# Patient Record
Sex: Female | Born: 1965 | Race: Black or African American | Hispanic: No | Smoking: Never smoker
Health system: Southern US, Community
[De-identification: ages and names within clinical notes are randomized; demographics above are authoritative.]

## PROBLEM LIST (undated history)

## (undated) DIAGNOSIS — I10 Essential (primary) hypertension: Secondary | ICD-10-CM

## (undated) DIAGNOSIS — M797 Fibromyalgia: Secondary | ICD-10-CM

## (undated) DIAGNOSIS — Z01419 Encounter for gynecological examination (general) (routine) without abnormal findings: Secondary | ICD-10-CM

## (undated) DIAGNOSIS — R51 Headache: Secondary | ICD-10-CM

## (undated) DIAGNOSIS — F419 Anxiety disorder, unspecified: Secondary | ICD-10-CM

## (undated) DIAGNOSIS — D509 Iron deficiency anemia, unspecified: Secondary | ICD-10-CM

## (undated) DIAGNOSIS — T7840XA Allergy, unspecified, initial encounter: Secondary | ICD-10-CM

## (undated) DIAGNOSIS — E669 Obesity, unspecified: Secondary | ICD-10-CM

## (undated) DIAGNOSIS — G43909 Migraine, unspecified, not intractable, without status migrainosus: Secondary | ICD-10-CM

## (undated) DIAGNOSIS — Z9884 Bariatric surgery status: Secondary | ICD-10-CM

## (undated) DIAGNOSIS — E538 Deficiency of other specified B group vitamins: Secondary | ICD-10-CM

## (undated) DIAGNOSIS — R55 Syncope and collapse: Secondary | ICD-10-CM

## (undated) DIAGNOSIS — R7989 Other specified abnormal findings of blood chemistry: Secondary | ICD-10-CM

## (undated) HISTORY — DX: Syncope and collapse: R55

## (undated) HISTORY — DX: Allergy, unspecified, initial encounter: T78.40XA

## (undated) HISTORY — DX: Obesity, unspecified: E66.9

## (undated) HISTORY — PX: ADENOIDECTOMY: SUR15

## (undated) HISTORY — DX: Headache: R51

## (undated) HISTORY — DX: Encounter for gynecological examination (general) (routine) without abnormal findings: Z01.419

## (undated) HISTORY — PX: UPPER GASTROINTESTINAL ENDOSCOPY: SHX188

## (undated) HISTORY — PX: GASTRIC BYPASS: SHX52

## (undated) HISTORY — DX: Deficiency of other specified B group vitamins: E53.8

## (undated) HISTORY — DX: Fibromyalgia: M79.7

## (undated) HISTORY — DX: Other specified abnormal findings of blood chemistry: R79.89

## (undated) HISTORY — DX: Migraine, unspecified, not intractable, without status migrainosus: G43.909

## (undated) HISTORY — DX: Bariatric surgery status: Z98.84

## (undated) HISTORY — DX: Essential (primary) hypertension: I10

## (undated) HISTORY — PX: TUBAL LIGATION: SHX77

## (undated) HISTORY — DX: Anxiety disorder, unspecified: F41.9

## (undated) HISTORY — DX: Iron deficiency anemia, unspecified: D50.9

---

## 2000-07-19 ENCOUNTER — Other Ambulatory Visit: Admission: RE | Admit: 2000-07-19 | Discharge: 2000-07-19 | Payer: Self-pay | Admitting: *Deleted

## 2001-04-24 ENCOUNTER — Emergency Department (HOSPITAL_COMMUNITY): Admission: EM | Admit: 2001-04-24 | Discharge: 2001-04-24 | Payer: Self-pay | Admitting: Emergency Medicine

## 2001-06-27 ENCOUNTER — Encounter: Admission: RE | Admit: 2001-06-27 | Discharge: 2001-08-01 | Payer: Self-pay | Admitting: Family Medicine

## 2002-10-11 ENCOUNTER — Other Ambulatory Visit: Admission: RE | Admit: 2002-10-11 | Discharge: 2002-10-11 | Payer: Self-pay | Admitting: Obstetrics and Gynecology

## 2002-10-16 ENCOUNTER — Encounter: Admission: RE | Admit: 2002-10-16 | Discharge: 2003-01-14 | Payer: Self-pay | Admitting: Internal Medicine

## 2002-10-17 ENCOUNTER — Encounter: Admission: RE | Admit: 2002-10-17 | Discharge: 2002-10-17 | Payer: Self-pay | Admitting: Internal Medicine

## 2002-10-17 ENCOUNTER — Encounter: Payer: Self-pay | Admitting: Internal Medicine

## 2002-10-31 HISTORY — PX: ESOPHAGOGASTRODUODENOSCOPY: SHX1529

## 2002-11-01 ENCOUNTER — Emergency Department (HOSPITAL_COMMUNITY): Admission: EM | Admit: 2002-11-01 | Discharge: 2002-11-02 | Payer: Self-pay | Admitting: Emergency Medicine

## 2005-08-09 ENCOUNTER — Ambulatory Visit: Payer: Self-pay | Admitting: Internal Medicine

## 2006-02-11 ENCOUNTER — Ambulatory Visit: Payer: Self-pay | Admitting: Internal Medicine

## 2006-10-20 ENCOUNTER — Ambulatory Visit: Payer: Self-pay | Admitting: Internal Medicine

## 2007-04-28 ENCOUNTER — Ambulatory Visit: Payer: Self-pay | Admitting: Internal Medicine

## 2007-06-25 ENCOUNTER — Emergency Department (HOSPITAL_COMMUNITY): Admission: EM | Admit: 2007-06-25 | Discharge: 2007-06-25 | Payer: Self-pay | Admitting: Emergency Medicine

## 2007-06-27 ENCOUNTER — Ambulatory Visit: Payer: Self-pay | Admitting: Internal Medicine

## 2007-06-27 LAB — CONVERTED CEMR LAB
ALT: 13 units/L (ref 0–35)
AST: 21 units/L (ref 0–37)
BUN: 6 mg/dL (ref 6–23)
Basophils Absolute: 0 10*3/uL (ref 0.0–0.1)
Basophils Relative: 0.4 % (ref 0.0–1.0)
CO2: 28 meq/L (ref 19–32)
Calcium: 8.3 mg/dL — ABNORMAL LOW (ref 8.4–10.5)
Chloride: 105 meq/L (ref 96–112)
Cholesterol: 144 mg/dL (ref 0–200)
Creatinine, Ser: 0.8 mg/dL (ref 0.4–1.2)
Eosinophils Absolute: 0.1 10*3/uL (ref 0.0–0.6)
Eosinophils Relative: 2.2 % (ref 0.0–5.0)
Ferritin: 6 ng/mL — ABNORMAL LOW (ref 10.0–291.0)
GFR calc Af Amer: 102 mL/min
GFR calc non Af Amer: 84 mL/min
Glucose, Bld: 58 mg/dL — ABNORMAL LOW (ref 70–99)
HCT: 26.6 % — ABNORMAL LOW (ref 36.0–46.0)
HDL: 40.4 mg/dL (ref >39.0)
Hemoglobin: 8.7 g/dL — ABNORMAL LOW (ref 12.0–15.0)
LDL Cholesterol: 96 mg/dL (ref 0–99)
Lymphocytes Relative: 52.5 % — ABNORMAL HIGH (ref 12.0–46.0)
MCHC: 32.7 g/dL (ref 30.0–36.0)
MCV: 74.7 fL — ABNORMAL LOW (ref 78.0–100.0)
Monocytes Absolute: 0.4 10*3/uL (ref 0.2–0.7)
Monocytes Relative: 7.3 % (ref 3.0–11.0)
Neutro Abs: 1.9 10*3/uL (ref 1.4–7.7)
Neutrophils Relative %: 37.6 % — ABNORMAL LOW (ref 43.0–77.0)
Platelets: 339 10*3/uL (ref 150–400)
Potassium: 3.6 meq/L (ref 3.5–5.1)
RBC: 3.57 M/uL — ABNORMAL LOW (ref 3.87–5.11)
RDW: 15.2 % — ABNORMAL HIGH (ref 11.5–14.6)
Sodium: 139 meq/L (ref 135–145)
TSH: 1.38 microintl units/mL (ref 0.35–5.50)
Total CHOL/HDL Ratio: 3.6
Triglycerides: 40 mg/dL (ref 0–149)
VLDL: 8 mg/dL (ref 0–40)
Vit D, 1,25-Dihydroxy: 9 — ABNORMAL LOW (ref 20–57)
WBC: 5 10*3/uL (ref 4.5–10.5)

## 2007-07-14 DIAGNOSIS — I10 Essential (primary) hypertension: Secondary | ICD-10-CM | POA: Insufficient documentation

## 2007-07-14 DIAGNOSIS — D509 Iron deficiency anemia, unspecified: Secondary | ICD-10-CM

## 2007-07-21 ENCOUNTER — Encounter: Payer: Self-pay | Admitting: Internal Medicine

## 2007-08-21 ENCOUNTER — Ambulatory Visit: Payer: Self-pay | Admitting: Internal Medicine

## 2007-08-21 DIAGNOSIS — E559 Vitamin D deficiency, unspecified: Secondary | ICD-10-CM | POA: Insufficient documentation

## 2007-08-21 DIAGNOSIS — R519 Headache, unspecified: Secondary | ICD-10-CM | POA: Insufficient documentation

## 2007-08-21 DIAGNOSIS — Z9884 Bariatric surgery status: Secondary | ICD-10-CM | POA: Insufficient documentation

## 2007-08-21 DIAGNOSIS — R51 Headache: Secondary | ICD-10-CM

## 2007-08-21 HISTORY — DX: Bariatric surgery status: Z98.84

## 2007-08-21 HISTORY — DX: Headache: R51

## 2007-08-21 LAB — CONVERTED CEMR LAB: Hemoglobin: 8.2 g/dL

## 2007-09-26 ENCOUNTER — Encounter: Payer: Self-pay | Admitting: Internal Medicine

## 2007-10-07 ENCOUNTER — Telehealth: Payer: Self-pay | Admitting: Internal Medicine

## 2007-11-22 ENCOUNTER — Encounter: Payer: Self-pay | Admitting: Internal Medicine

## 2008-05-20 ENCOUNTER — Ambulatory Visit: Payer: Self-pay | Admitting: Internal Medicine

## 2008-05-20 DIAGNOSIS — IMO0001 Reserved for inherently not codable concepts without codable children: Secondary | ICD-10-CM

## 2008-05-20 DIAGNOSIS — J069 Acute upper respiratory infection, unspecified: Secondary | ICD-10-CM | POA: Insufficient documentation

## 2008-06-05 ENCOUNTER — Telehealth: Payer: Self-pay | Admitting: Internal Medicine

## 2008-06-26 ENCOUNTER — Ambulatory Visit: Payer: Self-pay | Admitting: Internal Medicine

## 2008-06-26 DIAGNOSIS — J309 Allergic rhinitis, unspecified: Secondary | ICD-10-CM | POA: Insufficient documentation

## 2008-06-26 DIAGNOSIS — H53149 Visual discomfort, unspecified: Secondary | ICD-10-CM

## 2008-06-26 DIAGNOSIS — G43009 Migraine without aura, not intractable, without status migrainosus: Secondary | ICD-10-CM | POA: Insufficient documentation

## 2008-08-14 ENCOUNTER — Ambulatory Visit: Payer: Self-pay | Admitting: Internal Medicine

## 2008-08-14 LAB — CONVERTED CEMR LAB
ALT: 15 units/L (ref 0–35)
AST: 19 units/L (ref 0–37)
Albumin: 3.2 g/dL — ABNORMAL LOW (ref 3.5–5.2)
Alkaline Phosphatase: 158 units/L — ABNORMAL HIGH (ref 39–117)
BUN: 6 mg/dL (ref 6–23)
Basophils Absolute: 0 10*3/uL (ref 0.0–0.1)
Basophils Relative: 0.4 % (ref 0.0–3.0)
Bilirubin Urine: NEGATIVE
Bilirubin, Direct: 0.1 mg/dL (ref 0.0–0.3)
Blood in Urine, dipstick: NEGATIVE
CO2: 29 meq/L (ref 19–32)
Calcium: 8.3 mg/dL — ABNORMAL LOW (ref 8.4–10.5)
Chloride: 110 meq/L (ref 96–112)
Cholesterol: 146 mg/dL (ref 0–200)
Creatinine, Ser: 0.7 mg/dL (ref 0.4–1.2)
Eosinophils Absolute: 0.1 10*3/uL (ref 0.0–0.7)
Eosinophils Relative: 2.2 % (ref 0.0–5.0)
Ferritin: 5.4 ng/mL — ABNORMAL LOW (ref 10.0–291.0)
GFR calc Af Amer: 118 mL/min
GFR calc non Af Amer: 98 mL/min
Glucose, Bld: 91 mg/dL (ref 70–99)
Glucose, Urine, Semiquant: NEGATIVE
HCT: 29.4 % — ABNORMAL LOW (ref 36.0–46.0)
HDL: 34.3 mg/dL — ABNORMAL LOW (ref >39.0)
Hemoglobin: 9.7 g/dL — ABNORMAL LOW (ref 12.0–15.0)
Ketones, urine, test strip: NEGATIVE
LDL Cholesterol: 103 mg/dL — ABNORMAL HIGH (ref 0–99)
Lymphocytes Relative: 46.1 % — ABNORMAL HIGH (ref 12.0–46.0)
MCHC: 32.9 g/dL (ref 30.0–36.0)
MCV: 81.5 fL (ref 78.0–100.0)
Monocytes Absolute: 0.2 10*3/uL (ref 0.1–1.0)
Monocytes Relative: 6.9 % (ref 3.0–12.0)
Neutro Abs: 1.6 10*3/uL (ref 1.4–7.7)
Neutrophils Relative %: 44.4 % (ref 43.0–77.0)
Nitrite: NEGATIVE
Platelets: 246 10*3/uL (ref 150–400)
Potassium: 3.5 meq/L (ref 3.5–5.1)
Protein, U semiquant: NEGATIVE
RBC: 3.6 M/uL — ABNORMAL LOW (ref 3.87–5.11)
RDW: 14.1 % (ref 11.5–14.6)
Sodium: 140 meq/L (ref 135–145)
Specific Gravity, Urine: 1.015
TSH: 0.81 microintl units/mL (ref 0.35–5.50)
Total Bilirubin: 1 mg/dL (ref 0.3–1.2)
Total CHOL/HDL Ratio: 4.3
Total Protein: 6.6 g/dL (ref 6.0–8.3)
Triglycerides: 45 mg/dL (ref 0–149)
Urobilinogen, UA: 1
VLDL: 9 mg/dL (ref 0–40)
Vit D, 1,25-Dihydroxy: 23 — ABNORMAL LOW (ref 30–89)
WBC: 3.6 10*3/uL — ABNORMAL LOW (ref 4.5–10.5)
pH: 7

## 2008-08-21 ENCOUNTER — Ambulatory Visit: Payer: Self-pay | Admitting: Internal Medicine

## 2008-08-21 ENCOUNTER — Other Ambulatory Visit: Admission: RE | Admit: 2008-08-21 | Discharge: 2008-08-21 | Payer: Self-pay | Admitting: Internal Medicine

## 2008-08-21 ENCOUNTER — Encounter: Payer: Self-pay | Admitting: Internal Medicine

## 2008-08-21 DIAGNOSIS — R748 Abnormal levels of other serum enzymes: Secondary | ICD-10-CM | POA: Insufficient documentation

## 2008-08-26 ENCOUNTER — Telehealth: Payer: Self-pay | Admitting: Internal Medicine

## 2008-08-28 ENCOUNTER — Ambulatory Visit (HOSPITAL_COMMUNITY): Payer: Self-pay | Admitting: Psychiatry

## 2008-09-02 ENCOUNTER — Other Ambulatory Visit (HOSPITAL_COMMUNITY): Admission: RE | Admit: 2008-09-02 | Discharge: 2008-09-23 | Payer: Self-pay | Admitting: Psychiatry

## 2008-09-06 ENCOUNTER — Ambulatory Visit: Payer: Self-pay | Admitting: Psychiatry

## 2008-10-22 ENCOUNTER — Ambulatory Visit: Payer: Self-pay | Admitting: Internal Medicine

## 2008-10-22 LAB — CONVERTED CEMR LAB
ALT: 12 units/L (ref 0–35)
AST: 17 units/L (ref 0–37)
Albumin: 3.1 g/dL — ABNORMAL LOW (ref 3.5–5.2)
Alkaline Phosphatase: 156 units/L — ABNORMAL HIGH (ref 39–117)
Basophils Absolute: 0 10*3/uL (ref 0.0–0.1)
Basophils Relative: 0.2 % (ref 0.0–3.0)
Bilirubin, Direct: 0.2 mg/dL (ref 0.0–0.3)
Eosinophils Absolute: 0.1 10*3/uL (ref 0.0–0.7)
Eosinophils Relative: 2.4 % (ref 0.0–5.0)
GGT: 5 units/L — ABNORMAL LOW (ref 7–51)
HCT: 29.2 % — ABNORMAL LOW (ref 36.0–46.0)
Hemoglobin: 9.5 g/dL — ABNORMAL LOW (ref 12.0–15.0)
Lymphocytes Relative: 40.8 % (ref 12.0–46.0)
MCHC: 32.4 g/dL (ref 30.0–36.0)
MCV: 81.7 fL (ref 78.0–100.0)
Monocytes Absolute: 0.3 10*3/uL (ref 0.1–1.0)
Monocytes Relative: 7.3 % (ref 3.0–12.0)
Neutro Abs: 2.4 10*3/uL (ref 1.4–7.7)
Neutrophils Relative %: 49.3 % (ref 43.0–77.0)
Platelets: 253 10*3/uL (ref 150–400)
RBC: 3.57 M/uL — ABNORMAL LOW (ref 3.87–5.11)
RDW: 14.6 % (ref 11.5–14.6)
Total Bilirubin: 1 mg/dL (ref 0.3–1.2)
Total Protein: 6.2 g/dL (ref 6.0–8.3)
Vitamin B-12: 138 pg/mL — ABNORMAL LOW (ref 211–911)
WBC: 4.7 10*3/uL (ref 4.5–10.5)

## 2008-10-30 ENCOUNTER — Ambulatory Visit: Payer: Self-pay | Admitting: Internal Medicine

## 2008-10-30 DIAGNOSIS — D518 Other vitamin B12 deficiency anemias: Secondary | ICD-10-CM

## 2008-10-30 DIAGNOSIS — R350 Frequency of micturition: Secondary | ICD-10-CM | POA: Insufficient documentation

## 2008-10-30 LAB — CONVERTED CEMR LAB
Ketones, urine, test strip: NEGATIVE
Nitrite: NEGATIVE
Protein, U semiquant: NEGATIVE
Urobilinogen, UA: 0.2
pH: 5

## 2008-10-31 ENCOUNTER — Encounter: Payer: Self-pay | Admitting: Internal Medicine

## 2008-11-04 ENCOUNTER — Encounter: Payer: Self-pay | Admitting: Internal Medicine

## 2008-12-25 ENCOUNTER — Ambulatory Visit: Payer: Self-pay | Admitting: Internal Medicine

## 2008-12-25 LAB — CONVERTED CEMR LAB
Basophils Absolute: 0 10*3/uL (ref 0.0–0.1)
Eosinophils Absolute: 0.2 10*3/uL (ref 0.0–0.7)
HCT: 30.7 % — ABNORMAL LOW (ref 36.0–46.0)
Hemoglobin: 10.1 g/dL — ABNORMAL LOW (ref 12.0–15.0)
MCHC: 33 g/dL (ref 30.0–36.0)
MCV: 81.4 fL (ref 78.0–100.0)
Monocytes Absolute: 0.4 10*3/uL (ref 0.1–1.0)
Monocytes Relative: 6.8 % (ref 3.0–12.0)
Neutro Abs: 2.5 10*3/uL (ref 1.4–7.7)
Platelets: 285 10*3/uL (ref 150–400)
RDW: 15.1 % — ABNORMAL HIGH (ref 11.5–14.6)
Vitamin B-12: 193 pg/mL — ABNORMAL LOW (ref 211–911)

## 2009-01-01 ENCOUNTER — Ambulatory Visit: Payer: Self-pay | Admitting: Internal Medicine

## 2009-01-01 DIAGNOSIS — N920 Excessive and frequent menstruation with regular cycle: Secondary | ICD-10-CM | POA: Insufficient documentation

## 2009-01-22 ENCOUNTER — Ambulatory Visit: Payer: Self-pay | Admitting: Internal Medicine

## 2009-02-08 ENCOUNTER — Emergency Department (HOSPITAL_COMMUNITY): Admission: EM | Admit: 2009-02-08 | Discharge: 2009-02-08 | Payer: Self-pay | Admitting: Emergency Medicine

## 2009-03-04 ENCOUNTER — Ambulatory Visit: Payer: Self-pay | Admitting: Internal Medicine

## 2009-03-18 ENCOUNTER — Ambulatory Visit: Payer: Self-pay | Admitting: Internal Medicine

## 2009-03-24 ENCOUNTER — Ambulatory Visit: Payer: Self-pay | Admitting: Internal Medicine

## 2009-11-04 ENCOUNTER — Ambulatory Visit: Payer: Self-pay | Admitting: Internal Medicine

## 2009-12-02 ENCOUNTER — Ambulatory Visit: Payer: Self-pay | Admitting: Internal Medicine

## 2010-02-18 ENCOUNTER — Encounter: Payer: Self-pay | Admitting: *Deleted

## 2010-04-15 ENCOUNTER — Telehealth: Payer: Self-pay | Admitting: *Deleted

## 2010-11-25 ENCOUNTER — Telehealth: Payer: Self-pay | Admitting: Internal Medicine

## 2010-12-09 ENCOUNTER — Ambulatory Visit: Payer: Self-pay | Admitting: Internal Medicine

## 2010-12-09 ENCOUNTER — Encounter: Payer: Self-pay | Admitting: Internal Medicine

## 2010-12-15 LAB — CONVERTED CEMR LAB
Albumin: 3.2 g/dL — ABNORMAL LOW (ref 3.5–5.2)
BUN: 9 mg/dL (ref 6–23)
Basophils Relative: 0.5 % (ref 0.0–3.0)
Cholesterol: 165 mg/dL (ref 0–200)
Eosinophils Absolute: 0.1 10*3/uL (ref 0.0–0.7)
Folate: 7.6 ng/mL
GFR calc non Af Amer: 101.32 mL/min (ref >60.00)
Hgb A1c MFr Bld: 6 % (ref 4.6–6.5)
MCHC: 32.9 g/dL (ref 30.0–36.0)
MCV: 78.3 fL (ref 78.0–100.0)
Monocytes Absolute: 0.2 10*3/uL (ref 0.1–1.0)
Neutrophils Relative %: 48.2 % (ref 43.0–77.0)
Platelets: 275 10*3/uL (ref 150.0–400.0)
Potassium: 4.4 meq/L (ref 3.5–5.1)
Saturation Ratios: 9.2 % — ABNORMAL LOW (ref 20.0–50.0)
Sodium: 139 meq/L (ref 135–145)
TSH: 0.78 microintl units/mL (ref 0.35–5.50)
Total Protein: 6.4 g/dL (ref 6.0–8.3)
Transferrin: 319 mg/dL (ref 212.0–360.0)
VLDL: 9.4 mg/dL (ref 0.0–40.0)

## 2011-01-12 NOTE — Letter (Signed)
Summary: Generic Letter  [place] [facility]  [address] [place], [place] [address]   Phone: [phone]  Fax: [phone]    02/18/2010  Brooke Le [address] [address] [place], [place]  [address]  Dear Ms. PINDER,  We tried to call you about your missed appt on 12/02/2009. Please call our office back to schedule your missed lab and follow up appt at [phone].         Sincerely,   Tor Netters, CMA (AAMA)  Appended Document: Generic Letter Letter came back to office as not deliverable as addressed. Unable to forward.

## 2011-01-12 NOTE — Progress Notes (Signed)
Summary: note for work  Phone Note Call from Patient Call back at [employer] [phone]   Caller: Patient Summary of Call: Pt called saying that she did have miagraine on 3/28. So she was 3/7, 3/19 and 3/28 for miagraines. Pt needs a note saying that she was out March 28th for a miagraine. She does have FMLA but it was only for 2 days per month, 3 appts per year. Called again to check on whether her paperwork has been completed.   Rudy Jew, RN  Apr 15, 2010 3:28 PM  Initial call taken by: Romualdo Bolk, CMA Duncan Dull),  Apr 15, 2010 9:05 AM  Follow-up for Phone Call        [facility] did not follow up for her last appt  Thus cannot write a note for her. She should be seen  by a headache specialist  to manage these  headaches because she is missing so much work. I I can write a note  that we are referring her about this.   She then should have them do futureFMLA forms..  Follow-up by: Madelin Headings MD,  Apr 15, 2010 9:10 AM  Additional Follow-up for Phone Call Additional follow up Details #1::        LMTOCB Additional Follow-up by: Romualdo Bolk, CMA Duncan Dull),  Apr 15, 2010 1:52 PM    Additional Follow-up for Phone Call Additional follow up Details #2::    Spoke to pt and [facility]. She states that the ha's were brought on by stress and that is why she stopped going to them. Pt states that it would be okay to do another referral to someone else for ha's. Pt aware that note is up front ready to pick up. Follow-up by: Romualdo Bolk, CMA (AAMA),  Apr 16, 2010 12:10 PM

## 2011-01-14 NOTE — Progress Notes (Signed)
Summary: requesting labs  Phone Note Call from Patient Call back at Home Phone [phone]   Caller: Patient---live call Summary of Call: requesting dm labs. ok to order? Initial call taken by: Warnell Forester,  November 25, 2010 1:40 PM  Follow-up for Phone Call        Pt n/s for LIPID,LFTS,TSH,FREE T4,ESR,BMP,CBC DIFF,FERRTIN,IRON PANEL SATURATION AND B12 back in 10/2009. Then she didn't show up for a follow up after the labs either.  Left message for pt to call back. Follow-up by: Romualdo Bolk, CMA Duncan Dull),  November 25, 2010 1:47 PM  Additional Follow-up for Phone Call Additional follow up Details #1::        Spoke to pt-she didn't realize that she was suppose to have labs done last year. Pt does want to have her blood sugar check for dm. She is concerned about this but is not having any symptoms. What labs do you want her to have done? Additional Follow-up by: Romualdo Bolk, CMA Duncan Dull),  November 25, 2010 1:58 PM    Additional Follow-up for Phone Call Additional follow up Details #2::    OV  and can come in fasting  (  except water)   and we can get what is needed at the OV.  Follow-up by: Madelin Headings MD,  November 25, 2010 5:10 PM  Additional Follow-up for Phone Call Additional follow up Details #3:: Details for Additional Follow-up Action Taken: where to work in for a fasting ov? Additional Follow-up by: Warnell Forester,  November 26, 2010 1:23 PM  Please schedule in the next available rov in the am.  Romualdo Bolk, CMA Duncan Dull)  November 26, 2010 1:29 PM   lmoam to return my call...next available am appt is 01-07-2011 @ 8:30am........ccm  pt will coming in on 12-09-2010 fasting as indicated above........ccm

## 2011-01-14 NOTE — Assessment & Plan Note (Signed)
Summary: fup---will fast for labs per dr Yannely Kintzel//ccm   Vital Signs:  Patient profile:   45 year old female Menstrual status:  regular LMP:     12/03/2010 Height:      68.25 inches Weight:      248 pounds BMI:     37.57 Pulse rate:   60 / minute BP sitting:   130 / 80  (left arm) Cuff size:   large  Vitals Entered By: Romualdo Bolk, CMA (AAMA) (December 09, 2010 10:32 AM)  Nutrition Counseling: Patient's BMI is greater than 25 and therefore counseled on weight management options. CC: Follow-up visit- Pt is fasting for labs, Hypertension Management LMP (date): 12/03/2010 LMP - Character: heavy for 2 days then normal Menarche (age onset years): 13   Menses interval (days): 28 Menstrual flow (days): 5 Enter LMP: 12/03/2010 Last PAP Result NEGATIVE FOR INTRAEPITHELIAL LESIONS OR MALIGNANCY.   History of Present Illness: Brooke Le comes in today  for  follow up of anumber of issues but she is worried about diabetes as gets some shakiness a few hours after eating. HAs bettter since laid off job [employer] July 2011  Still lots of stress with no job and 5 teenagers in household. Feels tired  periods still heavy  and irregular.  . NEver came back for follow up of her anemia  from last visit which was a year ago.  Bp ? has been better recently off meds  Hypertension History:      She denies headache, chest pain, palpitations, dyspnea with exertion, orthopnea, PND, peripheral edema, visual symptoms, neurologic problems, syncope, and side effects from treatment.  She notes no problems with any antihypertensive medication side effects.        Positive major cardiovascular risk factors include hypertension.  Negative major cardiovascular risk factors include female age less than 74 years old and non-tobacco-user status.     Preventive Screening-Counseling & Management  Alcohol-Tobacco     Alcohol drinks/day: <1     Smoking Status: never  Caffeine-Diet-Exercise  Caffeine use/day: 1-2     Does Patient Exercise: no  Current Medications (verified): 1)  Relpax 20 Mg Tabs (Eletriptan Hydrobromide) .... Take 2)  Ferrex 28  Tabs (Feaspgl-Fefum-B12-Fa-C-Succ Ac) .[name].. 1 By Mouth Once Daily  For  Anemia  Allergies (verified): No Known Drug Allergies  Past History:  Past medical, surgical, family and social histories (including risk factors) reviewed, and no changes noted (except as noted below).  Past Medical History: Reviewed history from 11/04/2009 and no changes required. Hypertension had decreased with weight loss Obesity Migraine Headache    hx of seen in [facility] center in the remote past.  Anemia-iron deficiency Fibrmyalgia    Allergic rhinitis ER eval for syncope  in 2008  had anemia then     prev gyne Dr Jennette Kettle Low b12 level    Consults Dr. Gray Bernhardt  Past Surgical History: Reviewed history from 08/21/2008 and no changes required. Gastric bypass Tubal ligation  Adenoidectomy EGD-10/31/2002     Past History:  Care Management: None Current  Family History: Reviewed history from 10/30/2008 and no changes required.   Family History of Alcoholism/Addiction Family History Diabetes 1st degree relative Family History Hypertension Family History Other cancer   Social History: Reviewed history from 03/24/2009 and no changes required. Married Never Smoked   Alcohol use-yes Drug use-no Regular exercise-no Laid off  [employer].   July 2011   loses insurance in  january  hh of 6 teens  No pets   Review of Systems  The patient denies anorexia, fever, weight loss, weight gain, vision loss, decreased hearing, hoarseness, chest pain, syncope, dyspnea on exertion, peripheral edema, prolonged cough, hemoptysis, abdominal pain, melena, hematochezia, hematuria, difficulty walking, abnormal bleeding, enlarged lymph nodes, and angioedema.    Physical Exam  General:  Well-developed,well-nourished,in no acute distress;  alert,appropriate and cooperative throughout examination Head:  normocephalic and atraumatic.   Eyes:  PERRL, EOMs full, conjunctiva clear  Ears:  R ear normal, L ear normal, and no external deformities.   Nose:  no nasal discharge.   Mouth:  pharynx pink and moist.   Neck:  No deformities, masses, or tenderness noted. Lungs:  Normal respiratory effort, chest expands symmetrically. Lungs are clear to auscultation, no crackles or wheezes.no dullness.   Heart:  Normal rate and regular rhythm. S1 and S2 normal without gallop, murmur, click, rub or other extra sounds.no lifts.   Abdomen:  Bowel sounds positive,abdomen soft and non-tender without masses, organomegaly or hernias noted. Pulses:  pulses intact without delay   Extremities:  no clubbing cyanosis or edema  Neurologic:  alert & oriented X3, cranial nerves II-XII intact, strength normal in all extremities, gait normal, and DTRs symmetrical and normal.   Skin:  turgor normal, color normal, no ecchymoses, no petechiae, and tattoo(s).   Cervical Nodes:  No lymphadenopathy noted Psych:  Oriented X3, normally interactive, good eye contact, not anxious appearing, and not depressed appearing.     Impression & Recommendations:  Problem # 1:  ANEMIA-IRON DEFICIENCY (ICD-280.9)  Her updated medication list for this problem includes:    Ferrex 28 Tabs (Feaspgl-fefum-b12-fa-c-succ ac) .[name]... 1 by mouth once daily  for  anemia  Orders: TLB-CBC Platelet - w/Differential (85025-CBCD)  Hgb: 10.1 (12/25/2008)   Hct: 30.7 (12/25/2008)   Platelets: 285 (12/25/2008) RBC: 3.77 (12/25/2008)   RDW: 15.1 (12/25/2008)   WBC: 5.6 (12/25/2008) MCV: 81.4 (12/25/2008)   MCHC: 33.0 (12/25/2008) Ferritin: 5.4 (08/14/2008) B12: 193 (12/25/2008)   TSH: 0.81 (08/14/2008)  Problem # 2:  ANEMIA, B12 DEFICIENCY (ICD-281.1)  Her updated medication list for this problem includes:    Ferrex 28 Tabs (Feaspgl-fefum-b12-fa-c-succ ac) .[name]... 1 by mouth once daily  for   anemia  Orders: TLB-B12 + Folate Pnl (16109_60454-U98/JXB) TLB-IBC Pnl (Iron/FE;Transferrin) (83550-IBC) T- * Misc. Laboratory test [phone]) Specimen Handling (95621) Venipuncture [phone])  Hgb: 10.1 (12/25/2008)   Hct: 30.7 (12/25/2008)   Platelets: 285 (12/25/2008) RBC: 3.77 (12/25/2008)   RDW: 15.1 (12/25/2008)   WBC: 5.6 (12/25/2008) MCV: 81.4 (12/25/2008)   MCHC: 33.0 (12/25/2008) Ferritin: 5.4 (08/14/2008) B12: 193 (12/25/2008)   TSH: 0.81 (08/14/2008)  Problem # 3:  MENORRHAGIA (ICD-626.2) Assessment: Comment Only  Problem # 4:  HEADACHE (ICD-784.0) Assessment: Improved less since lost job  Her updated medication list for this problem includes:    Relpax 20 Mg Tabs (Eletriptan hydrobromide) .[name]... 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 Take 2)  Ferrex 28 Tabs (Feaspgl-fefum-b12-fa-c-succ ac) .[name].. 1 by mouth once daily  for  anemia  Other Orders: TLB-TSH (Thyroid Stimulating Hormone) (84443-TSH) TLB-Hepatic/Liver Function Pnl (80076-HEPATIC) TLB-Lipid Panel (80061-LIPID) TLB-A1C / Hgb A1C (Glycohemoglobin) (83036-A1C)  Hypertension Assessment/Plan:  The patient's hypertensive risk group is category A: No risk factors and no target organ damage.  Her calculated 10 year risk of coronary heart disease is 6 %.  Today's blood pressure is 130/80.  Her blood pressure goal is < 140/90.  Patient Instructions: 1)  You will be informed of lab results when available.  2)  protein with each meal snack 3)  avoid refined  carbohydrates and sugars. 4)  exercise and weight  loss 5)  YOu may need to be on a  vitamin supplement because of your bypass surgery.    Orders Added: 1)  TLB-TSH (Thyroid Stimulating Hormone) [84443-TSH] 2)  TLB-Hepatic/Liver Function Pnl [80076-HEPATIC] 3)  TLB-CBC Platelet - w/Differential [85025-CBCD] 4)  TLB-BMP (Basic Metabolic Panel-BMET) [80048-METABOL] 5)  TLB-Lipid Panel [80061-LIPID] 6)  TLB-B12 + Folate Pnl [82746_82607-B12/FOL] 7)  TLB-IBC Pnl (Iron/FE;Transferrin) [83550-IBC] 8)  T-Vitamin D (25-Hydroxy) [82306-85810] 9)  T- * Misc. Laboratory test [99999] 10)  TLB-A1C / Hgb A1C (Glycohemoglobin) [83036-A1C] 11)  Specimen Handling [99000] 12)  Venipuncture [36415] 13)  Est. Patient Level IV [13086]

## 2011-03-30 LAB — URINALYSIS, ROUTINE W REFLEX MICROSCOPIC
Bilirubin Urine: NEGATIVE
Protein, ur: 100 mg/dL — AB
Urobilinogen, UA: 2 mg/dL — ABNORMAL HIGH (ref 0.0–1.0)

## 2011-03-30 LAB — URINE MICROSCOPIC-ADD ON

## 2011-06-22 ENCOUNTER — Telehealth: Payer: Self-pay | Admitting: *Deleted

## 2011-06-22 NOTE — Telephone Encounter (Addendum)
Pt. Is having irregular periods x 3- 4 months.  Periods are heavy, but not enough to be dangerous.  No anemia symptoms.  Just annoying. Has had  tubal ligation, and wonders if she is going through peri-menopause.

## 2011-06-23 NOTE — Telephone Encounter (Signed)
She could be but   Would rec she see GYNE   To evaluate.

## 2011-06-23 NOTE — Telephone Encounter (Signed)
Pt. Advised. 

## 2011-09-28 LAB — POCT CARDIAC MARKERS
CKMB, poc: <1 — ABNORMAL LOW
Myoglobin, poc: 52.5
Troponin i, poc: <0.05

## 2011-09-28 LAB — DIFFERENTIAL
Basophils Absolute: 0
Basophils Relative: 0
Eosinophils Absolute: 0.1
Eosinophils Relative: 2
Monocytes Absolute: 0.3
Monocytes Relative: 6

## 2011-09-28 LAB — I-STAT 8, (EC8 V) (CONVERTED LAB)
Bicarbonate: 24.6 — ABNORMAL HIGH
Glucose, Bld: 96
HCT: 30 — ABNORMAL LOW
Potassium: 4
TCO2: 26
pH, Ven: 7.433 — ABNORMAL HIGH

## 2011-09-28 LAB — CBC
HCT: 28.5 — ABNORMAL LOW
Hemoglobin: 9.2 — ABNORMAL LOW
MCHC: 32.3
MCV: 75.5 — ABNORMAL LOW
RBC: 3.77 — ABNORMAL LOW
RDW: 16.4 — ABNORMAL HIGH

## 2011-09-28 LAB — POCT I-STAT CREATININE: Operator id: 277751

## 2012-03-20 ENCOUNTER — Ambulatory Visit (INDEPENDENT_AMBULATORY_CARE_PROVIDER_SITE_OTHER): Payer: Managed Care, Other (non HMO) | Admitting: Internal Medicine

## 2012-03-20 ENCOUNTER — Encounter: Payer: Self-pay | Admitting: Internal Medicine

## 2012-03-20 DIAGNOSIS — Z0289 Encounter for other administrative examinations: Secondary | ICD-10-CM

## 2012-04-20 ENCOUNTER — Encounter: Payer: Self-pay | Admitting: Internal Medicine

## 2012-04-20 ENCOUNTER — Ambulatory Visit (INDEPENDENT_AMBULATORY_CARE_PROVIDER_SITE_OTHER): Payer: Managed Care, Other (non HMO) | Admitting: Internal Medicine

## 2012-04-20 VITALS — BP 122/80 | HR 87 | Temp 98.7°F | Wt 249.0 lb

## 2012-04-20 DIAGNOSIS — R0982 Postnasal drip: Secondary | ICD-10-CM

## 2012-04-20 DIAGNOSIS — Z9884 Bariatric surgery status: Secondary | ICD-10-CM

## 2012-04-20 DIAGNOSIS — J310 Chronic rhinitis: Secondary | ICD-10-CM

## 2012-04-20 DIAGNOSIS — J309 Allergic rhinitis, unspecified: Secondary | ICD-10-CM

## 2012-04-20 MED ORDER — MOMETASONE FUROATE 50 MCG/ACT NA SUSP: 2.0000 | Freq: Every day | NASAL | Status: DC

## 2012-04-20 NOTE — Patient Instructions (Signed)
This recurrent drainage and hoarseness could be allergy induced or prior mental irritation plus an occasional head cold.  At this time I do not think antibiotics would be helpful however I think we should treat for allergy.  Take an antihistamine every day such as Allegra Claritin or Zyrtec; generic is fine. This is to help runny nose and postnasal drainage. Take a nasal cortisone Nasonex 2 sprays on each side of the nose once a day this is a controller medicine for nasal allergies and chronic nose problems.  Discharge and he is helpful after a week. We can continue on this during the season and see how we do  If you're having continued hoarseness that is not improving over the next 3-4 weeks and contact us for followup sometimes silent reflux we'll add to hoarseness and throat clearing.  You can continue a hot tea honey nasal saline nose sprays also.

## 2012-04-20 NOTE — Progress Notes (Signed)
  Subjective:    Patient ID: Brooke Le, female    DOB: [birth year], 46 y.o.   MRN: [id number]  HPI Patient comes in today for SDA for  new problem evaluation. Last visit was 12 2011  Recurring  Sx  Off and on since December    3 - 4  episodesOf nose congestion drainage hoarse scratchy throat . Cant seem to get better . Although better in between  Not resolved and has "Not normal voice" and throat clearing  No hx of allergies.   Has tried and using Tylenol sinus and theraflu and home remedies and tea and lemon.  Gargles. Runny nose.   This time and  Increase for the past 3-4 days .  No fevers cp sob  Current face pain but has pressure . Now back at work and seems to be getting   Lots aof colds ;using hand  Wipes     ? If environment changes.  No ets .  HH of" too many" no pets.  No ets.   Review of Systems NO cp sob wheezing  No aciteve heartburn or current HA  Past history family history social history reviewed in the electronic medical record.  Outpatient Encounter Prescriptions as of 04/20/2012  Medication Sig Dispense Refill  . eletriptan (RELPAX) 20 MG tablet One tablet by mouth at onset of headache. May repeat in 2 hours if headache persists or recurs. may repeat in 2 hours if necessary      . FeAspGl-FeFum-B12-FA-C-Succ Ac (FERREX 28 PO) Take by mouth daily.      working [employer] center       Objective:   Physical Exam BP 122/80  Pulse 87  Temp(Src) 98.7 F (37.1 C) (Oral)  Wt 249 lb (112.946 kg)  SpO2 97%  LMP 04/06/2012 WDWN in NAD  quiet respirations; mildly congested  somewhat hoarse. Non toxic . HEENT: Normocephalic ;atraumatic , Eyes;  PERRL, EOMs  Full, lids and conjunctiva clear,,Ears: no deformities, canals nl, TM landmarks normal, Nose: no deformity  congested;face minimally tender Mouth : OP clear without lesion or edema . cobblestoning  Neck: Supple without adenopathy or masses or bruits Chest:  Clear to A&P without wheezes rales or rhonchi CV:  S1-S2 no  gallops or murmurs peripheral perfusion is normal Skin :nl perfusion and no acute rashes      Assessment & Plan:   Recurrent ur sx  Poss uris but could have underlying allergy chronic rhinitis  And or even silent reflux contributing Treatment options discussed. Saline  And add nasonex samples given . Fu plan discussed .   Ht ok off meds at this time HAs hx of migraines SP bariatric sugery .

## 2012-04-22 ENCOUNTER — Encounter: Payer: Self-pay | Admitting: Internal Medicine

## 2012-04-22 DIAGNOSIS — R0982 Postnasal drip: Secondary | ICD-10-CM | POA: Insufficient documentation

## 2012-04-22 DIAGNOSIS — J310 Chronic rhinitis: Secondary | ICD-10-CM | POA: Insufficient documentation

## 2012-06-14 ENCOUNTER — Encounter: Payer: Self-pay | Admitting: Family

## 2012-06-14 ENCOUNTER — Ambulatory Visit: Payer: Managed Care, Other (non HMO) | Admitting: Internal Medicine

## 2012-06-14 ENCOUNTER — Ambulatory Visit (INDEPENDENT_AMBULATORY_CARE_PROVIDER_SITE_OTHER): Payer: Managed Care, Other (non HMO) | Admitting: Family

## 2012-06-14 VITALS — BP 134/80 | Temp 98.8°F | Wt 251.0 lb

## 2012-06-14 DIAGNOSIS — G43909 Migraine, unspecified, not intractable, without status migrainosus: Secondary | ICD-10-CM

## 2012-06-14 DIAGNOSIS — R110 Nausea: Secondary | ICD-10-CM

## 2012-06-14 MED ORDER — KETOROLAC TROMETHAMINE 60 MG/2ML IM SOLN
60.0000 mg | Freq: Once | INTRAMUSCULAR | Status: AC
  Administered 2012-06-14: 60 mg via INTRAMUSCULAR

## 2012-06-14 MED ORDER — PROMETHAZINE HCL 25 MG/ML IJ SOLN
25.0000 mg | Freq: Once | INTRAMUSCULAR | Status: AC
  Administered 2012-06-14: 25 mg via INTRAMUSCULAR

## 2012-06-14 NOTE — Patient Instructions (Addendum)

## 2012-06-14 NOTE — Progress Notes (Signed)
Subjective:    Patient ID: Brooke Le, female    DOB: 07-Aug-1966, 46 y.o.   MRN: [id number]  HPI  46 year old AAF is in today with c/o headache to the right side of her head X 2 days. She describes it as throbbing. She has taken Ibuprofen with no relief. She has a a history of HTN and migraine headaches. She is not currently taking medication for either issue. She has nausea but denies vomiting.   Review of Systems  Constitutional: Negative.   Eyes: Negative.   Respiratory: Negative.   Cardiovascular: Negative.   Gastrointestinal: Positive for nausea.  Musculoskeletal: Negative.   Neurological: Positive for headaches.  Psychiatric/Behavioral: Negative.    Past Medical History  Diagnosis Date  . Hypertension     had decreased with weight loss  . Obesity   . Migraine headache     hx of seen in [facility] center in the remote past  . Anemia, iron deficiency   . Fibromyalgia   . Allergic rhinitis   . Syncope     ER evaluation in 2008; had anemia then  . Gynecological examination     Dr. Jennette Kettle  . Low vitamin B12 level   . PSTPRC STATUS, BARIATRIC SURGERY 08/21/2007    Qualifier: Diagnosis of  By: Fabian Sharp MD, Neta Mends     History   Social History  . Marital Status: Married    Spouse Name: N/A    Number of Children: N/A  . Years of Education: N/A   Occupational History  . Not on file.   Social History Main Topics  . Smoking status: Never Smoker   . Smokeless tobacco: Never Used  . Alcohol Use: Not on file  . Drug Use: Not on file  . Sexually Active: Not on file   Other Topics Concern  . Not on file   Social History Narrative   MarriedRegular Exercise- noLaid off [employer]- July 2011 lost insurance in [place] to work at [employer] long shifts hh of 6 teens No pets     Past Surgical History  Procedure Date  . Gastric bypass   . Tubal ligation   . Adenoidectomy   . Esophagogastroduodenoscopy 10/31/2002    Family History  Problem Relation Age  of Onset  . Alcohol abuse Other   . Diabetes Other   . Hypertension Other     No Known Allergies  Current Outpatient Prescriptions on File Prior to Visit  Medication Sig Dispense Refill  . FeAspGl-FeFum-B12-FA-C-Succ Ac (FERREX 28 PO) Take by mouth daily.      . mometasone (NASONEX) 50 MCG/ACT nasal spray Place 2 sprays into the nose daily.  17 g  2   No current facility-administered medications on file prior to visit.    BP 134/80  Temp 98.8 F (37.1 C) (Oral)  Wt 251 lb (113.853 kg)chart    Objective:   Physical Exam  Constitutional: She is oriented to person, place, and time. She appears well-developed and well-nourished.  HENT:  Head: Normocephalic.  Eyes: Conjunctivae are normal. Pupils are equal, round, and reactive to light.  Neck: Normal range of motion. Neck supple.  Cardiovascular: Normal rate, regular rhythm and normal heart sounds.   Pulmonary/Chest: Effort normal and breath sounds normal.  Musculoskeletal: Normal range of motion.  Neurological: She is alert and oriented to person, place, and time. She has normal reflexes.  Skin: Skin is warm and dry.  Psychiatric: She has a normal mood and affect.  Assessment & Plan:  Assessment: Migraine Headache, Nausea  Plan: Toradol 60mg  IM and Phenergan 25mg  IM x 1. Call the office if symptoms worsen or persist. Refilled Relpax. Patient to call the office if symptoms worsen or persist. Recheck as scheduled and as needed sooner.

## 2012-09-15 NOTE — Progress Notes (Signed)
°  Subjective:    Patient ID: Brooke Le, female    DOB: May 07, 1966, 46 y.o.   MRN: [id number]  HPI    Review of Systems     Objective:   Physical Exam        Assessment & Plan:  Pt was not seen, no showed.

## 2012-12-22 ENCOUNTER — Telehealth: Payer: Self-pay | Admitting: Family Medicine

## 2012-12-22 NOTE — Telephone Encounter (Signed)
lmom for pt to call to sch appt °

## 2012-12-22 NOTE — Telephone Encounter (Signed)
This patient dropped off some paperwork for Dr. Fabian Sharp to complete.  Per [facility], she will need to come in and be seen in the office.  Please call the pt and scheduled this appointment for next week.  Thanks!!!

## 2012-12-25 NOTE — Telephone Encounter (Signed)
lmom for pt to call to sch appt °

## 2012-12-27 NOTE — Telephone Encounter (Signed)
Pt is sch for 1-16

## 2012-12-27 NOTE — Telephone Encounter (Signed)
lmom for pt to call to sch appt

## 2012-12-28 ENCOUNTER — Ambulatory Visit (INDEPENDENT_AMBULATORY_CARE_PROVIDER_SITE_OTHER): Payer: Managed Care, Other (non HMO) | Admitting: Internal Medicine

## 2012-12-28 ENCOUNTER — Encounter: Payer: Self-pay | Admitting: Internal Medicine

## 2012-12-28 VITALS — BP 118/80 | HR 109 | Temp 98.5°F | Wt 249.0 lb

## 2012-12-28 DIAGNOSIS — G43009 Migraine without aura, not intractable, without status migrainosus: Secondary | ICD-10-CM

## 2012-12-28 DIAGNOSIS — D518 Other vitamin B12 deficiency anemias: Secondary | ICD-10-CM

## 2012-12-28 DIAGNOSIS — Z733 Stress, not elsewhere classified: Secondary | ICD-10-CM

## 2012-12-28 DIAGNOSIS — Z9884 Bariatric surgery status: Secondary | ICD-10-CM

## 2012-12-28 DIAGNOSIS — F439 Reaction to severe stress, unspecified: Secondary | ICD-10-CM

## 2012-12-28 DIAGNOSIS — D509 Iron deficiency anemia, unspecified: Secondary | ICD-10-CM

## 2012-12-28 NOTE — Patient Instructions (Signed)
Will get you form filled out . Calendar headaches and missed work in My chart .  You may benefit  From controller meds taken every day such as topamax .    You need to et a preventive   Visit with labs and B12 and iron level.

## 2012-12-28 NOTE — Progress Notes (Signed)
Chief Complaint  Patient presents with  . Migraine    The patient is here to have her FMLA paperwork filled out for work because of her migraines.    HPI: patient appt because she wants fmla form done again for migraines   Last visit with me was for rhinitis and had migraine in summer  seeing NP . No t under s[pecialty care for her migraines at this time .   Triggers : Stress  Cause  Sometimes  2 x per month.   Recently cause of home issue  About 3-4 days in 3-4 months. Uses excedrin migraine or imitrex .  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  Bp has been ok  Taking iron b12 n for anemia . No recent  check  Usually misses work about 3-4 x in a few months but sometimes has bad months and misses 4-5 days !  Not currently ion prophylaxis but has been on this previously.  ROS: See pertinent positives and negatives per HPI.  Past Medical History  Diagnosis Date  . Hypertension     had decreased with weight loss  . Obesity   . Migraine headache     hx of seen in [facility] center in the remote past  . Anemia, iron deficiency   . Fibromyalgia   . Allergic rhinitis   . Syncope     ER evaluation in 2008; had anemia then  . Gynecological examination     Dr. Jennette Kettle  . Low vitamin B12 level   . 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 Headache 08/21/2007    Qualifier: Diagnosis of  By: Fabian Sharp MD, Neta Mends     Family History  Problem Relation Age of Onset  . Alcohol abuse Other   . Diabetes Other   . Hypertension Other     History   Social History  . Marital Status: Married    Spouse Name: N/A    Number of Children: N/A  . Years of Education: N/A   Social History Main Topics  . Smoking status: Never Smoker   . Smokeless tobacco: Never Used  . Alcohol Use: None  . Drug Use: None  . Sexually Active: None   Other Topics Concern  . None   Social History Narrative   MarriedRegular Exercise- noLaid off [employer]- July 2011 lost insurance in [place] to work at [employer] long shifts hh of 6 teens No pets     Outpatient Encounter Prescriptions as of 12/28/2012  Medication Sig Dispense Refill  . Cyanocobalamin (B-12 PO) Take by mouth.      . FeAspGl-FeFum-B12-FA-C-Succ Ac (FERREX 28 PO) Take by mouth daily.      . mometasone (NASONEX) 50 MCG/ACT nasal spray Place 2 sprays into the nose daily.  17 g  2    EXAM:  BP 118/80  Pulse 109  Temp 98.5 F (36.9 C) (Oral)  Wt 249 lb (112.946 kg)  SpO2 98%  LMP 12/20/2012  There is no height on file to calculate BMI.  GENERAL: vitals reviewed and listed above, alert, oriented, appears well hydrated and in no acute distress  HEENT: atraumatic, conjunctiva  clear, no obvious abnormalities on inspection of external nose and ears OP : no lesion edema or exudate   NECK: no obvious masses on inspection palpation  LUNGS: clear to auscultation bilaterally, no wheezes, rales or rhonchi, good air movement  CV: HRRR, no clubbing cyanosis or  peripheral edema nl cap refill   MS: moves all extremities without noticeable focal  abnormality NEURO: oriented x 3 CN 3-12 appear intact. No focal muscle weakness or atrophy. DTRs symmetrical. Gait WNL.  Grossly non focal. No tremor or abnormal movement.  PSYCH: pleasant and cooperative, no obvious depression or anxiety  ASSESSMENT AND PLAN:  Discussed the following assessment and plan:  1. COMMON MIGRAINE   2. PSTPRC STATUS, BARIATRIC SURGERY   3. ANEMIA-IRON DEFICIENCY   4. ANEMIA, B12 DEFICIENCY   5. Stress    ahead and do FMLA form however would have her calendar and communicate patterns by my chart. She may be a candidate for control medication including something like Topamax. She also is due for routine blood work preventive visit to include a B12 and iron level. She will range for this. We can do this and followup will contact her when  FMLA form and is ready.  -Patient advised to return or notify health care team  immediately if symptoms worsen or persist or new concerns arise.  Patient Instructions  Will get you form filled out . Calendar headaches and missed work in My chart .  You may benefit  From controller meds taken every day such as topamax .    You need to et a preventive   Visit with labs and B12 and iron level.     Neta Mends. Panosh M.D.

## 2012-12-29 ENCOUNTER — Encounter: Payer: Self-pay | Admitting: Internal Medicine

## 2012-12-29 ENCOUNTER — Telehealth: Payer: Self-pay | Admitting: Internal Medicine

## 2012-12-29 DIAGNOSIS — F439 Reaction to severe stress, unspecified: Secondary | ICD-10-CM | POA: Insufficient documentation

## 2012-12-29 NOTE — Telephone Encounter (Signed)
Comp;eted  Please scan and get to patient.

## 2012-12-29 NOTE — Telephone Encounter (Signed)
Pt is needing paperwork she left on Jan, 7,2014. Pt states she really needs this paperwork ASAP. Pls advise.

## 2013-01-01 NOTE — Telephone Encounter (Signed)
Called and left a message on cell informing the pt to pick up paperwork at the front desk.

## 2013-01-09 ENCOUNTER — Other Ambulatory Visit: Payer: Managed Care, Other (non HMO)

## 2013-01-18 ENCOUNTER — Encounter: Payer: Managed Care, Other (non HMO) | Admitting: Internal Medicine

## 2013-01-18 DIAGNOSIS — Z0289 Encounter for other administrative examinations: Secondary | ICD-10-CM

## 2013-01-27 ENCOUNTER — Other Ambulatory Visit: Payer: Self-pay

## 2013-10-02 ENCOUNTER — Other Ambulatory Visit (INDEPENDENT_AMBULATORY_CARE_PROVIDER_SITE_OTHER): Payer: BC Managed Care – PPO

## 2013-10-02 DIAGNOSIS — Z0000 Encounter for general adult medical examination without abnormal findings: Secondary | ICD-10-CM

## 2013-10-02 LAB — CBC WITH DIFFERENTIAL/PLATELET
Basophils Absolute: 0 10*3/uL (ref 0.0–0.1)
HCT: 31.4 % — ABNORMAL LOW (ref 36.0–46.0)
Lymphs Abs: 1.7 10*3/uL (ref 0.7–4.0)
MCV: 83.8 fl (ref 78.0–100.0)
Monocytes Absolute: 0.2 10*3/uL (ref 0.1–1.0)
Monocytes Relative: 6.5 % (ref 3.0–12.0)
Neutrophils Relative %: 43.2 % (ref 43.0–77.0)
Platelets: 288 10*3/uL (ref 150.0–400.0)
RDW: 14.7 % — ABNORMAL HIGH (ref 11.5–14.6)

## 2013-10-02 LAB — HEPATIC FUNCTION PANEL
ALT: 15 U/L (ref 0–35)
AST: 18 U/L (ref 0–37)
Bilirubin, Direct: 0.1 mg/dL (ref 0.0–0.3)
Total Bilirubin: 1.2 mg/dL (ref 0.3–1.2)

## 2013-10-02 LAB — BASIC METABOLIC PANEL
BUN: 9 mg/dL (ref 6–23)
Creatinine, Ser: 0.8 mg/dL (ref 0.4–1.2)
GFR: 95.86 mL/min (ref >60.00)
Glucose, Bld: 95 mg/dL (ref 70–99)
Potassium: 4 mEq/L (ref 3.5–5.1)

## 2013-10-02 LAB — LIPID PANEL: Cholesterol: 145 mg/dL (ref 0–200)

## 2013-10-09 ENCOUNTER — Encounter: Payer: Self-pay | Admitting: Internal Medicine

## 2013-10-09 ENCOUNTER — Encounter: Payer: Managed Care, Other (non HMO) | Admitting: Internal Medicine

## 2013-10-09 NOTE — Progress Notes (Signed)
Document opened and reviewed for OV but appt  canceled same day . Couldn't get here on time

## 2013-10-15 ENCOUNTER — Encounter: Payer: BC Managed Care – PPO | Admitting: Internal Medicine

## 2013-10-15 ENCOUNTER — Encounter: Payer: Self-pay | Admitting: Internal Medicine

## 2013-10-15 DIAGNOSIS — Z0289 Encounter for other administrative examinations: Secondary | ICD-10-CM

## 2013-10-15 NOTE — Progress Notes (Signed)
  Document opened and reviewed for OV CPX but appt  NS same day .

## 2013-10-18 ENCOUNTER — Other Ambulatory Visit: Payer: Self-pay

## 2014-06-13 ENCOUNTER — Encounter: Payer: Self-pay | Admitting: Physician Assistant

## 2014-06-13 ENCOUNTER — Ambulatory Visit (INDEPENDENT_AMBULATORY_CARE_PROVIDER_SITE_OTHER): Payer: BC Managed Care – PPO | Admitting: Physician Assistant

## 2014-06-13 VITALS — BP 110/80 | HR 60 | Temp 98.9°F | Resp 18 | Wt 258.0 lb

## 2014-06-13 DIAGNOSIS — R609 Edema, unspecified: Secondary | ICD-10-CM

## 2014-06-13 NOTE — Progress Notes (Signed)
Subjective:    Patient ID: Brooke Le, female    DOB: Aug 15, 1966, 48 y.o.   MRN: [id number]  HPI Pt is a 48 y/o female presenting for foot and leg swelling that started yesterday. She states that this always happens in the Summer. She states that this is a little better today than yesterday after elevating her legs last night. She states that her main symptom yesterday was tightness, not really any pain, redness, or increased warmth. 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 She has successfully treat this in the past with leg raises. She denies F/C/N/V/D/SOB/CP/HA/syncope.   Review of Systems As per HPI and otherwise negative.   Past Medical History  Diagnosis Date  . Hypertension     had decreased with weight loss  . Obesity   . Migraine headache     hx of seen in [facility] center in the remote past  . Anemia, iron deficiency   . Fibromyalgia   . Allergic rhinitis   . Syncope     ER evaluation in 2008; had anemia then  . Gynecological examination     Dr. Nori Riis  . Low vitamin B12 level   . 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 Headache(784.0) 08/21/2007    Qualifier: Diagnosis of  By: Regis Bill MD, Standley Brooking     History   Social History  . Marital Status: Married    Spouse Name: N/A    Number of Children: N/A  . Years of Education: N/A   Occupational History  . Not on file.   Social History Main Topics  . Smoking status: Never Smoker   . Smokeless tobacco: Never Used  . Alcohol Use: Not on file  . Drug Use: Not on file  . Sexual Activity: Not on file   Other Topics Concern  . Not on file   Social History Narrative   Married   Regular Exercise- no   Laid off [employer]- July 2011 lost insurance in [place]   Back to work at [employer] long shifts    hh of 6 teens    No pets                 Past Surgical  History  Procedure Laterality Date  . Gastric bypass    . Tubal ligation    . Adenoidectomy    . Esophagogastroduodenoscopy  10/31/2002    Family History  Problem Relation Age of Onset  . Alcohol abuse Other   . Diabetes Other   . Hypertension Other     No Known Allergies  Current Outpatient Prescriptions on File Prior to Visit  Medication Sig Dispense Refill  . Cyanocobalamin (B-12 PO) Take by mouth.      . FeAspGl-FeFum-B12-FA-C-Succ Ac (FERREX 28 PO) Take by mouth daily.      . mometasone (NASONEX) 50 MCG/ACT nasal spray Place 2 sprays into the nose daily.  17 g  2   No current facility-administered medications on file prior to visit.    EXAM: BP 110/80  Pulse 60  Temp(Src) 98.9 F (37.2 C) (Oral)  Resp 18  Wt 258 lb (117.028 kg)  SpO2 95%     Objective:   Physical Exam  Nursing note and vitals reviewed. Constitutional: She is oriented to person, place, and time.  She appears well-developed and well-nourished. No distress.  HENT:  Head: Normocephalic and atraumatic.  Eyes: Conjunctivae and EOM are normal. Pupils are equal, round, and reactive to light.  Neck: Normal range of motion.  Cardiovascular: Normal rate, regular rhythm and intact distal pulses.   Pulmonary/Chest: Effort normal and breath sounds normal. No respiratory distress. She exhibits no tenderness.  Musculoskeletal: Normal range of motion. She exhibits edema (less than 1+ pitting bilat. edema.). She exhibits no tenderness.  Neurological: She is alert and oriented to person, place, and time.  Skin: Skin is warm and dry. No rash noted. She is not diaphoretic. No erythema. No pallor.  Psychiatric: She has a normal mood and affect. Her behavior is normal. Judgment and thought content normal.    Lab Results  Component Value Date   WBC 3.7* 10/02/2013   HGB 10.4* 10/02/2013   HCT 31.4* 10/02/2013   PLT 288.0 10/02/2013   GLUCOSE 95 10/02/2013   CHOL 145 10/02/2013   TRIG 38.0 10/02/2013   HDL  44.40 10/02/2013   LDLCALC 93 10/02/2013   ALT 15 10/02/2013   AST 18 10/02/2013   NA 139 10/02/2013   K 4.0 10/02/2013   CL 106 10/02/2013   CREATININE 0.8 10/02/2013   BUN 9 10/02/2013   CO2 26 10/02/2013   TSH 0.68 10/02/2013   HGBA1C 6.0 12/09/2010         Assessment & Plan:  Brooke Le was seen today for foot swelling.  Diagnoses and associated orders for this visit:  Edema Comments: Better with leg elevation. Will continue, above level of heart. Add compression stockings during the day, and pillows under mattress at foot of bed at night.    No sign of infection, not suspicious for DVT.  Return precautions provided, and patient handout on edema.  Plan to follow up as needed, or for worsening or persistent symptoms despite treatment.  Patient Instructions  Continue leg raises daily, especially above the level of her heart when possible.  Stuff a couple of pillows under your mattress at the foot of your bed to elevate your feet slightly more at night why your sleeping.  You can also wear compression hose during the day while you're sitting at your desk to help prevent the accumulation of swelling during the day.  If emergency symptoms discussed during visit developed, seek medical attention immediately.  Followup as needed, or for worsening or persistent symptoms despite treatment.

## 2014-06-13 NOTE — Patient Instructions (Addendum)
Continue leg raises daily, especially above the level of her heart when possible.  Stuff a couple of pillows under your mattress at the foot of your bed to elevate your feet slightly more at night why your sleeping.  You can also wear compression hose during the day while you're sitting at your desk to help prevent the accumulation of swelling during the day.  If emergency symptoms discussed during visit developed, seek medical attention immediately.  Followup as needed, or for worsening or persistent symptoms despite treatment.  Edema Edema is an abnormal buildup of fluids. It is more common in your legs and thighs. Painless swelling of the feet and ankles is more likely as a person ages. It also is common in looser skin, like around your eyes. HOME CARE   Keep the affected body part above the level of the heart while lying down.  Do not sit still or stand for a long time.  Do not put anything right under your knees when you lie down.  Do not wear tight clothes on your upper legs.  Exercise your legs to help the puffiness (swelling) go down.  Wear elastic bandages or support stockings as told by your doctor.  A low-salt diet may help lessen the puffiness.  Only take medicine as told by your doctor. GET HELP IF:  Treatment is not working.  You have heart, liver, or kidney disease and notice that your skin looks puffy or shiny.  You have puffiness in your legs that does not get better when you raise your legs.  You have sudden weight gain for no reason. GET HELP RIGHT AWAY IF:   You have shortness of breath or chest pain.  You cannot breathe when you lie down.  You have pain, redness, or warmth in the areas that are puffy.  You have heart, liver, or kidney disease and get edema all of a sudden.  You have a fever and your symptoms get worse all of a sudden. MAKE SURE YOU:   Understand these instructions.  Will watch your condition.  Will get help right away if you  are not doing well or get worse. Document Released: 05/17/2008 Document Revised: 12/04/2013 Document Reviewed: 09/21/2013 [facility] Patient Information 2015 [place], [place]. This information is not intended to replace advice given to you by your health care provider. Make sure you discuss any questions you have with your health care provider.

## 2014-06-13 NOTE — Progress Notes (Signed)
Pre visit review using our clinic review tool, if applicable. No additional management support is needed unless otherwise documented below in the visit note. 

## 2014-09-18 ENCOUNTER — Other Ambulatory Visit: Payer: BC Managed Care – PPO

## 2014-09-25 ENCOUNTER — Encounter: Payer: BC Managed Care – PPO | Admitting: Internal Medicine

## 2014-09-25 ENCOUNTER — Encounter: Payer: Self-pay | Admitting: Internal Medicine

## 2014-09-25 DIAGNOSIS — Z0289 Encounter for other administrative examinations: Secondary | ICD-10-CM

## 2014-09-25 NOTE — Progress Notes (Signed)
Document opened and reviewed for wellness visit . No showed .  

## 2015-03-26 ENCOUNTER — Encounter: Payer: Self-pay | Admitting: Adult Health

## 2015-03-26 ENCOUNTER — Other Ambulatory Visit (INDEPENDENT_AMBULATORY_CARE_PROVIDER_SITE_OTHER): Payer: Self-pay

## 2015-03-26 ENCOUNTER — Ambulatory Visit (INDEPENDENT_AMBULATORY_CARE_PROVIDER_SITE_OTHER): Payer: BLUE CROSS/BLUE SHIELD | Admitting: Adult Health

## 2015-03-26 ENCOUNTER — Telehealth: Payer: Self-pay | Admitting: *Deleted

## 2015-03-26 VITALS — BP 122/82 | Temp 98.5°F | Wt 263.3 lb

## 2015-03-26 DIAGNOSIS — S93401A Sprain of unspecified ligament of right ankle, initial encounter: Secondary | ICD-10-CM

## 2015-03-26 DIAGNOSIS — Z0000 Encounter for general adult medical examination without abnormal findings: Secondary | ICD-10-CM | POA: Diagnosis not present

## 2015-03-26 LAB — LIPID PANEL
CHOL/HDL RATIO: 3
CHOLESTEROL: 152 mg/dL (ref 0–200)
HDL: 46.2 mg/dL (ref >39.00)
LDL Cholesterol: 96 mg/dL (ref 0–99)
NonHDL: 105.8
TRIGLYCERIDES: 47 mg/dL (ref 0.0–149.0)
VLDL: 9.4 mg/dL (ref 0.0–40.0)

## 2015-03-26 LAB — BASIC METABOLIC PANEL
BUN: 8 mg/dL (ref 6–23)
CALCIUM: 8.5 mg/dL (ref 8.4–10.5)
CO2: 26 meq/L (ref 19–32)
CREATININE: 0.88 mg/dL (ref 0.40–1.20)
Chloride: 106 mEq/L (ref 96–112)
GFR: 87.81 mL/min (ref >60.00)
GLUCOSE: 80 mg/dL (ref 70–99)
Potassium: 3.5 mEq/L (ref 3.5–5.1)
Sodium: 138 mEq/L (ref 135–145)

## 2015-03-26 LAB — HEPATIC FUNCTION PANEL
ALK PHOS: 148 U/L — AB (ref 39–117)
ALT: 14 U/L (ref 0–35)
AST: 16 U/L (ref 0–37)
Albumin: 3.2 g/dL — ABNORMAL LOW (ref 3.5–5.2)
BILIRUBIN TOTAL: 0.6 mg/dL (ref 0.2–1.2)
Bilirubin, Direct: 0.1 mg/dL (ref 0.0–0.3)
Total Protein: 6.5 g/dL (ref 6.0–8.3)

## 2015-03-26 LAB — CBC WITH DIFFERENTIAL/PLATELET
BASOS ABS: 0 10*3/uL (ref 0.0–0.1)
Basophils Relative: 0.1 % (ref 0.0–3.0)
EOS ABS: 0.1 10*3/uL (ref 0.0–0.7)
EOS PCT: 2.4 % (ref 0.0–5.0)
Lymphocytes Relative: 39.4 % (ref 12.0–46.0)
Lymphs Abs: 2 10*3/uL (ref 0.7–4.0)
MCHC: 32.5 g/dL (ref 30.0–36.0)
MCV: 74.3 fl — AB (ref 78.0–100.0)
MONOS PCT: 6.2 % (ref 3.0–12.0)
Monocytes Absolute: 0.3 10*3/uL (ref 0.1–1.0)
NEUTROS ABS: 2.6 10*3/uL (ref 1.4–7.7)
Neutrophils Relative %: 51.9 % (ref 43.0–77.0)
Platelets: 281 10*3/uL (ref 150.0–400.0)
RBC: 3.51 Mil/uL — ABNORMAL LOW (ref 3.87–5.11)
RDW: 17.4 % — ABNORMAL HIGH (ref 11.5–15.5)
WBC: 5 10*3/uL (ref 4.0–10.5)

## 2015-03-26 LAB — TSH: TSH: 1.8 u[IU]/mL (ref 0.35–4.50)

## 2015-03-26 NOTE — Patient Instructions (Signed)
Keep your ankle wrapped with the ace bandage for the next week. In the evening elevate your leg on some pillows and add ice.. 20 minutes on and 20 minutes off through out the evening. Ibuprofen 600mg  every 8 hours for the next two days and then as needed. Please take that with food. If you noticed numbness or tingling in toes, your foot feels cooler than the other one or you have bruising, please follow up with me. It was great meeting you!

## 2015-03-26 NOTE — Telephone Encounter (Signed)
Noted  

## 2015-03-26 NOTE — Telephone Encounter (Signed)
Pt walked in wanting to be seen for a hurt left ankle.  She thinks she may have sprained it.  The pain started Saturday and there was no injury.  She bought an ankle brace and it does help some.  Her BP 126/84.  She was not in any acute distress.  Appt was scheduled for 03/25/14 at 3 with Dorothyann Peng, NP

## 2015-03-26 NOTE — Progress Notes (Signed)
Pre visit review using our clinic review tool, if applicable. No additional management support is needed unless otherwise documented below in the visit note. 

## 2015-03-26 NOTE — Progress Notes (Signed)
   Subjective:    Patient ID: Brooke Le, female    DOB: January 16, 1966, 49 y.o.   MRN: [id number]  HPI [name] presents to the office for occassional "annoying" pain to the right ankle and knee. She first noticed that the ankle was hurting about a week ago. She does not know why it hurts or if she did anything to hurt it. She denies any trauma. Shortly after she noticed her knee was hurting. She denies any trauma to the knee as well. Denies any issues with ROM or walking. She has not noticed any bruising or swelling.    Review of Systems  Constitutional: Negative for activity change.  Musculoskeletal: Negative for joint swelling, arthralgias and gait problem.  Skin: Negative.        Objective:   Physical Exam  Constitutional: She is oriented to person, place, and time. She appears well-developed and well-nourished.  Musculoskeletal: Normal range of motion. She exhibits no edema or tenderness.  No bruising and no deformity  Neurological: She is alert and oriented to person, place, and time.  Skin: Skin is warm and dry.  Psychiatric: She has a normal mood and affect. Her behavior is normal.  Nursing note and vitals reviewed.      Assessment & Plan:  Ankle Sprain - Ace wrap applied  - Advised patient to elevate leg in the evening, add ice to area that hurts and take ibuprofen as needed.  - No issues with ROM, no bruising and no deformity. She is able to apply pressure with the foot. I do not suspect any fracture at this time. No need for xray - Follow up if she has any concerns.

## 2015-04-02 ENCOUNTER — Encounter: Payer: Self-pay | Admitting: Internal Medicine

## 2015-04-02 ENCOUNTER — Telehealth: Payer: Self-pay | Admitting: Internal Medicine

## 2015-04-02 DIAGNOSIS — Z0289 Encounter for other administrative examinations: Secondary | ICD-10-CM

## 2015-04-02 NOTE — Progress Notes (Signed)
Document opened and reviewed for CPXOV but appt  NS same day . See phone note about work.

## 2015-04-02 NOTE — Telephone Encounter (Signed)
Please tell patient that she is quite anemia and will plan on getting  iron nd b12 levels  Also  We can do that at the next visit .  Noon on either wed  May 4th or wed MAy 11th

## 2015-04-02 NOTE — Telephone Encounter (Signed)
Pt was called into work today and has to cancel her cpx. She has already had labs drawn for this and needs to be worked in prior to 05/13 so that her insurance company does not charge her for her labs.

## 2015-04-23 ENCOUNTER — Telehealth: Payer: Self-pay | Admitting: Family Medicine

## 2015-04-23 ENCOUNTER — Ambulatory Visit (INDEPENDENT_AMBULATORY_CARE_PROVIDER_SITE_OTHER): Payer: BLUE CROSS/BLUE SHIELD | Admitting: Internal Medicine

## 2015-04-23 NOTE — Telephone Encounter (Signed)
Tried reaching the pt on her cell.  Left a message.  Home number machine picked up but did not seem to belong to [facility].  No message left.  Will send to [facility] to see if she wants to charge for the missed visit.

## 2015-04-23 NOTE — Telephone Encounter (Signed)
This is the second missed appt. Charge for no show .  Do not make  cpx appt reschedule  She needs to know that she  is significantly anemic  And needs    Follow up to discuss  but has to  Come to and appt  Let patient know the situation  .

## 2015-04-23 NOTE — Progress Notes (Signed)
Document opened and reviewed for wellness visit . No showed .  

## 2015-04-23 NOTE — Telephone Encounter (Signed)
Will now send to clinic site manager so she can charge for the missed appointment.   Please send message back to me so I can continue to try and reach the pt.

## 2015-05-06 NOTE — Telephone Encounter (Signed)
LM for the pt to return my call. 

## 2015-05-07 NOTE — Telephone Encounter (Signed)
LM for the pt to return my call. 

## 2015-05-09 NOTE — Telephone Encounter (Signed)
LM for a return call.  Have tried multiple times to reach the pt.  Certified letter needs to be sent.  Pt needs to be aware of her illness (anemia).  Will send message to Dahlia Byes, Clinic Site Manager so she may send letter.

## 2015-05-14 NOTE — Telephone Encounter (Signed)
Please generate letter and print so that I can send it via certified mail.

## 2015-05-19 ENCOUNTER — Encounter: Payer: Self-pay | Admitting: Family Medicine

## 2015-05-19 NOTE — Telephone Encounter (Signed)
Letter placed in drop box

## 2015-05-28 NOTE — Telephone Encounter (Signed)
Certified letter returned to the office as "return to sender."  Unable to reach the pt by mail or telephone.

## 2015-08-26 ENCOUNTER — Emergency Department (HOSPITAL_COMMUNITY): Payer: BLUE CROSS/BLUE SHIELD

## 2015-08-26 ENCOUNTER — Encounter (HOSPITAL_COMMUNITY): Payer: Self-pay | Admitting: Emergency Medicine

## 2015-08-26 ENCOUNTER — Emergency Department (HOSPITAL_COMMUNITY)
Admission: EM | Admit: 2015-08-26 | Discharge: 2015-08-26 | Disposition: A | Discharge status: Home / Self Care | Payer: BLUE CROSS/BLUE SHIELD | Attending: Emergency Medicine | Admitting: Emergency Medicine

## 2015-08-26 DIAGNOSIS — S8011XA Contusion of right lower leg, initial encounter: Secondary | ICD-10-CM | POA: Insufficient documentation

## 2015-08-26 DIAGNOSIS — S199XXA Unspecified injury of neck, initial encounter: Secondary | ICD-10-CM | POA: Diagnosis not present

## 2015-08-26 DIAGNOSIS — Y92410 Unspecified street and highway as the place of occurrence of the external cause: Secondary | ICD-10-CM | POA: Diagnosis not present

## 2015-08-26 DIAGNOSIS — Y9389 Activity, other specified: Secondary | ICD-10-CM | POA: Diagnosis not present

## 2015-08-26 DIAGNOSIS — S8012XA Contusion of left lower leg, initial encounter: Secondary | ICD-10-CM | POA: Insufficient documentation

## 2015-08-26 DIAGNOSIS — S0990XA Unspecified injury of head, initial encounter: Secondary | ICD-10-CM | POA: Insufficient documentation

## 2015-08-26 DIAGNOSIS — Z9884 Bariatric surgery status: Secondary | ICD-10-CM | POA: Insufficient documentation

## 2015-08-26 DIAGNOSIS — Y998 Other external cause status: Secondary | ICD-10-CM | POA: Diagnosis not present

## 2015-08-26 DIAGNOSIS — E669 Obesity, unspecified: Secondary | ICD-10-CM | POA: Insufficient documentation

## 2015-08-26 DIAGNOSIS — I10 Essential (primary) hypertension: Secondary | ICD-10-CM | POA: Insufficient documentation

## 2015-08-26 DIAGNOSIS — S299XXA Unspecified injury of thorax, initial encounter: Secondary | ICD-10-CM | POA: Diagnosis not present

## 2015-08-26 DIAGNOSIS — Z79899 Other long term (current) drug therapy: Secondary | ICD-10-CM | POA: Diagnosis not present

## 2015-08-26 DIAGNOSIS — Z8739 Personal history of other diseases of the musculoskeletal system and connective tissue: Secondary | ICD-10-CM | POA: Insufficient documentation

## 2015-08-26 DIAGNOSIS — Z7951 Long term (current) use of inhaled steroids: Secondary | ICD-10-CM | POA: Insufficient documentation

## 2015-08-26 DIAGNOSIS — D509 Iron deficiency anemia, unspecified: Secondary | ICD-10-CM | POA: Insufficient documentation

## 2015-08-26 DIAGNOSIS — E538 Deficiency of other specified B group vitamins: Secondary | ICD-10-CM | POA: Insufficient documentation

## 2015-08-26 LAB — CBC WITH DIFFERENTIAL/PLATELET
BASOS ABS: 0 10*3/uL (ref 0.0–0.1)
Basophils Relative: 0 % (ref 0–1)
Eosinophils Absolute: 0.1 10*3/uL (ref 0.0–0.7)
Eosinophils Relative: 2 % (ref 0–5)
HEMATOCRIT: 28.2 % — AB (ref 36.0–46.0)
Hemoglobin: 8.8 g/dL — ABNORMAL LOW (ref 12.0–15.0)
LYMPHS PCT: 36 % (ref 12–46)
Lymphs Abs: 2.2 10*3/uL (ref 0.7–4.0)
MCH: 23.2 pg — ABNORMAL LOW (ref 26.0–34.0)
MCHC: 31.2 g/dL (ref 30.0–36.0)
MCV: 74.4 fL — AB (ref 78.0–100.0)
Monocytes Absolute: 0.4 10*3/uL (ref 0.1–1.0)
Monocytes Relative: 6 % (ref 3–12)
NEUTROS ABS: 3.2 10*3/uL (ref 1.7–7.7)
NEUTROS PCT: 56 % (ref 43–77)
Platelets: 297 10*3/uL (ref 150–400)
RBC: 3.79 MIL/uL — AB (ref 3.87–5.11)
RDW: 16.3 % — ABNORMAL HIGH (ref 11.5–15.5)
WBC: 5.9 10*3/uL (ref 4.0–10.5)

## 2015-08-26 LAB — BASIC METABOLIC PANEL
ANION GAP: 7 (ref 5–15)
BUN: <5 mg/dL — ABNORMAL LOW (ref 6–20)
CO2: 24 mmol/L (ref 22–32)
Calcium: 8.5 mg/dL — ABNORMAL LOW (ref 8.9–10.3)
Chloride: 106 mmol/L (ref 101–111)
Creatinine, Ser: 0.87 mg/dL (ref 0.44–1.00)
GLUCOSE: 105 mg/dL — AB (ref 65–99)
POTASSIUM: 3.4 mmol/L — AB (ref 3.5–5.1)
Sodium: 137 mmol/L (ref 135–145)

## 2015-08-26 LAB — I-STAT TROPONIN, ED: Troponin i, poc: 0.01 ng/mL (ref 0.00–0.08)

## 2015-08-26 LAB — I-STAT CG4 LACTIC ACID, ED: LACTIC ACID, VENOUS: 1.39 mmol/L (ref 0.5–2.0)

## 2015-08-26 MED ORDER — MORPHINE SULFATE (PF) 4 MG/ML IV SOLN
4.0000 mg | Freq: Once | INTRAVENOUS | Status: AC
  Administered 2015-08-26: 4 mg via INTRAVENOUS
  Filled 2015-08-26: qty 1

## 2015-08-26 MED ORDER — SODIUM CHLORIDE 0.9 % IV BOLUS (SEPSIS)
1000.0000 mL | Freq: Once | INTRAVENOUS | Status: AC
Start: 2015-08-26 — End: 2015-08-26
  Administered 2015-08-26: 1000 mL via INTRAVENOUS

## 2015-08-26 MED ORDER — IOHEXOL 300 MG/ML  SOLN
75.0000 mL | Freq: Once | INTRAMUSCULAR | Status: AC | PRN
  Administered 2015-08-26: 75 mL via INTRAVENOUS

## 2015-08-26 MED ORDER — ACETAMINOPHEN 500 MG PO TABS
1000.0000 mg | ORAL_TABLET | Freq: Once | ORAL | Status: AC
  Administered 2015-08-26: 1000 mg via ORAL
  Filled 2015-08-26: qty 2
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
## 2015-08-26 NOTE — ED Notes (Signed)
Pt ambulatory to restroom, tolerated well, NAD, VSS.  Discharged with family.
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
## 2015-08-26 NOTE — ED Notes (Signed)
Pt given ginger ale and graham crackers for PO trial.

## 2015-08-26 NOTE — ED Notes (Signed)
Patient transported to CT. Chris with CT aware to take pt to xray after ct completed.
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
## 2015-08-27 ENCOUNTER — Telehealth: Payer: Self-pay | Admitting: Internal Medicine

## 2015-08-27 NOTE — Telephone Encounter (Signed)
WP is not in the office this week.  She will need to see another provider or will have to wait until Dr. Mamie Nick approves time next week.
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
## 2015-08-28 ENCOUNTER — Encounter: Payer: Self-pay | Admitting: Family Medicine

## 2015-08-28 ENCOUNTER — Ambulatory Visit (INDEPENDENT_AMBULATORY_CARE_PROVIDER_SITE_OTHER): Payer: BLUE CROSS/BLUE SHIELD | Admitting: Family Medicine

## 2015-08-28 ENCOUNTER — Encounter: Payer: Self-pay | Admitting: *Deleted

## 2015-08-28 DIAGNOSIS — K7689 Other specified diseases of liver: Secondary | ICD-10-CM

## 2015-08-28 DIAGNOSIS — E041 Nontoxic single thyroid nodule: Secondary | ICD-10-CM

## 2015-08-28 DIAGNOSIS — K769 Liver disease, unspecified: Secondary | ICD-10-CM

## 2015-08-28 DIAGNOSIS — S134XXS Sprain of ligaments of cervical spine, sequela: Secondary | ICD-10-CM | POA: Diagnosis not present

## 2015-08-28 NOTE — Telephone Encounter (Signed)
Pt has been scheduled w/ dr Maudie Mercury
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
## 2015-08-28 NOTE — Progress Notes (Signed)
Pre visit review using our clinic review tool, if applicable. No additional management support is needed unless otherwise documented below in the visit note. 

## 2015-08-28 NOTE — Patient Instructions (Addendum)
BEFORE YOU LEAVE: -schedule follow up with Dr. Regis Bill in 2-3 months -letter to return to work tomorrow -whiplash exercises for neck  -We placed a referral for you as discussed for the ultrasound of the neck and liver as we discussed. It usually takes about 1-2 weeks to process and schedule this referral. If you have not heard from [place] regarding this appointment in 2 weeks please contact our office.  -tylenol per instruction on bottle as needed  -heat as needed  -gentle exercises for neck

## 2015-09-08 ENCOUNTER — Ambulatory Visit (INDEPENDENT_AMBULATORY_CARE_PROVIDER_SITE_OTHER): Payer: BLUE CROSS/BLUE SHIELD | Admitting: Internal Medicine

## 2015-09-08 ENCOUNTER — Encounter: Payer: Self-pay | Admitting: Internal Medicine

## 2015-09-08 VITALS — BP 128/70 | HR 63 | Temp 98.1°F | Ht 68.0 in | Wt 259.0 lb

## 2015-09-08 DIAGNOSIS — E041 Nontoxic single thyroid nodule: Secondary | ICD-10-CM

## 2015-09-08 DIAGNOSIS — G44309 Post-traumatic headache, unspecified, not intractable: Secondary | ICD-10-CM

## 2015-09-08 DIAGNOSIS — S161XXA Strain of muscle, fascia and tendon at neck level, initial encounter: Secondary | ICD-10-CM | POA: Diagnosis not present

## 2015-09-08 DIAGNOSIS — D509 Iron deficiency anemia, unspecified: Secondary | ICD-10-CM | POA: Diagnosis not present

## 2015-09-08 DIAGNOSIS — R937 Abnormal findings on diagnostic imaging of other parts of musculoskeletal system: Secondary | ICD-10-CM

## 2015-09-08 MED ORDER — AMITRIPTYLINE HCL 10 MG PO TABS: 10.0000 mg | ORAL_TABLET | Freq: Every day | ORAL | Status: DC

## 2015-09-08 NOTE — Patient Instructions (Addendum)
Many things need to be followed up.  Need  Referral .       Ok to go back 1/2 days  For now .   4 hours per day for 2 weeks  Until can be seen and plan made   May need physical therapy   Also.  Begin low dose .amitryptiline   For pain and  Headache . Need thyroid ultraound  To check out the incidental thyroid nodule.  Take iron   You are anemiua and this needs follow up.   Plan rov in about 2-3 weeks   Or as needed
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
## 2015-09-23 ENCOUNTER — Ambulatory Visit (INDEPENDENT_AMBULATORY_CARE_PROVIDER_SITE_OTHER): Payer: BLUE CROSS/BLUE SHIELD | Admitting: Internal Medicine

## 2015-09-23 ENCOUNTER — Encounter: Payer: Self-pay | Admitting: Internal Medicine

## 2015-09-23 VITALS — BP 140/96 | Temp 97.5°F | Wt 265.6 lb

## 2015-09-23 DIAGNOSIS — G44309 Post-traumatic headache, unspecified, not intractable: Secondary | ICD-10-CM

## 2015-09-23 DIAGNOSIS — K7689 Other specified diseases of liver: Secondary | ICD-10-CM | POA: Diagnosis not present

## 2015-09-23 DIAGNOSIS — R930 Abnormal findings on diagnostic imaging of skull and head, not elsewhere classified: Secondary | ICD-10-CM

## 2015-09-23 DIAGNOSIS — K769 Liver disease, unspecified: Secondary | ICD-10-CM

## 2015-09-23 DIAGNOSIS — S134XXS Sprain of ligaments of cervical spine, sequela: Secondary | ICD-10-CM | POA: Diagnosis not present

## 2015-09-23 DIAGNOSIS — D509 Iron deficiency anemia, unspecified: Secondary | ICD-10-CM | POA: Diagnosis not present

## 2015-09-23 DIAGNOSIS — IMO0001 Reserved for inherently not codable concepts without codable children: Secondary | ICD-10-CM

## 2015-09-23 DIAGNOSIS — E041 Nontoxic single thyroid nodule: Secondary | ICD-10-CM

## 2015-09-23 DIAGNOSIS — R030 Elevated blood-pressure reading, without diagnosis of hypertension: Secondary | ICD-10-CM

## 2015-09-23 LAB — CBC WITH DIFFERENTIAL/PLATELET
BASOS PCT: 0.3 % (ref 0.0–3.0)
Basophils Absolute: 0 10*3/uL (ref 0.0–0.1)
EOS PCT: 2.6 % (ref 0.0–5.0)
Eosinophils Absolute: 0.1 10*3/uL (ref 0.0–0.7)
HEMATOCRIT: 30.2 % — AB (ref 36.0–46.0)
Hemoglobin: 9.4 g/dL — ABNORMAL LOW (ref 12.0–15.0)
LYMPHS PCT: 49 % — AB (ref 12.0–46.0)
Lymphs Abs: 2.2 10*3/uL (ref 0.7–4.0)
MCHC: 31.1 g/dL (ref 30.0–36.0)
MCV: 76.1 fl — AB (ref 78.0–100.0)
MONOS PCT: 8.3 % (ref 3.0–12.0)
Monocytes Absolute: 0.4 10*3/uL (ref 0.1–1.0)
NEUTROS ABS: 1.8 10*3/uL (ref 1.4–7.7)
Neutrophils Relative %: 39.8 % — ABNORMAL LOW (ref 43.0–77.0)
PLATELETS: 254 10*3/uL (ref 150.0–400.0)
RBC: 3.97 Mil/uL (ref 3.87–5.11)
RDW: 19 % — AB (ref 11.5–15.5)
WBC: 4.4 10*3/uL (ref 4.0–10.5)

## 2015-09-23 LAB — TSH: TSH: 1.09 u[IU]/mL (ref 0.35–4.50)

## 2015-09-23 LAB — T4, FREE: Free T4: 0.73 ng/dL (ref 0.60–1.60)

## 2015-09-23 NOTE — Patient Instructions (Addendum)
Increase amytriptyline to 20 mg per night . Stay on  The muscle relaxant. And  proceed with physical therapy.  Will re refer for neck and liver ultrasound .  Will notify you  of labs when available. Stay on the iron until further notice.  Then plan follow up. In about 1 month.
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
## 2015-10-24 ENCOUNTER — Encounter: Payer: Self-pay | Admitting: Internal Medicine

## 2015-10-24 ENCOUNTER — Ambulatory Visit (INDEPENDENT_AMBULATORY_CARE_PROVIDER_SITE_OTHER): Payer: BLUE CROSS/BLUE SHIELD | Admitting: Internal Medicine

## 2015-10-24 VITALS — BP 158/82 | Temp 98.7°F | Wt 256.6 lb

## 2015-10-24 DIAGNOSIS — G44309 Post-traumatic headache, unspecified, not intractable: Secondary | ICD-10-CM | POA: Diagnosis not present

## 2015-10-24 DIAGNOSIS — R030 Elevated blood-pressure reading, without diagnosis of hypertension: Secondary | ICD-10-CM

## 2015-10-24 DIAGNOSIS — Z9884 Bariatric surgery status: Secondary | ICD-10-CM | POA: Diagnosis not present

## 2015-10-24 DIAGNOSIS — E041 Nontoxic single thyroid nodule: Secondary | ICD-10-CM

## 2015-10-24 DIAGNOSIS — D509 Iron deficiency anemia, unspecified: Secondary | ICD-10-CM | POA: Diagnosis not present

## 2015-10-24 DIAGNOSIS — IMO0001 Reserved for inherently not codable concepts without codable children: Secondary | ICD-10-CM

## 2015-10-24 LAB — CBC WITH DIFFERENTIAL/PLATELET
Basophils Absolute: 0 10*3/uL (ref 0.0–0.1)
Basophils Relative: 0.6 % (ref 0.0–3.0)
EOS ABS: 0.1 10*3/uL (ref 0.0–0.7)
Eosinophils Relative: 2.8 % (ref 0.0–5.0)
HCT: 29.3 % — ABNORMAL LOW (ref 36.0–46.0)
HEMOGLOBIN: 9.4 g/dL — AB (ref 12.0–15.0)
Lymphocytes Relative: 42.3 % (ref 12.0–46.0)
Lymphs Abs: 1.7 10*3/uL (ref 0.7–4.0)
MCHC: 31.9 g/dL (ref 30.0–36.0)
MCV: 75.7 fl — ABNORMAL LOW (ref 78.0–100.0)
MONO ABS: 0.2 10*3/uL (ref 0.1–1.0)
Monocytes Relative: 6.2 % (ref 3.0–12.0)
Neutro Abs: 1.9 10*3/uL (ref 1.4–7.7)
Neutrophils Relative %: 48.1 % (ref 43.0–77.0)
Platelets: 293 10*3/uL (ref 150.0–400.0)
RBC: 3.87 Mil/uL (ref 3.87–5.11)
RDW: 18.9 % — ABNORMAL HIGH (ref 11.5–15.5)
WBC: 4 10*3/uL (ref 4.0–10.5)

## 2015-10-24 LAB — VITAMIN B12: VITAMIN B 12: 186 pg/mL — AB (ref 211–911)

## 2015-10-24 LAB — IBC PANEL
IRON: 56 ug/dL (ref 42–145)
SATURATION RATIOS: 13.2 % — AB (ref 20.0–50.0)
TRANSFERRIN: 303 mg/dL (ref 212.0–360.0)

## 2015-10-24 LAB — FERRITIN: Ferritin: 6.3 ng/mL — ABNORMAL LOW (ref 10.0–291.0)

## 2015-10-24 MED ORDER — VERAPAMIL HCL ER 120 MG PO TBCR: 120.0000 mg | EXTENDED_RELEASE_TABLET | Freq: Every day | ORAL | Status: DC

## 2015-10-24 MED ORDER — AMITRIPTYLINE HCL 10 MG PO TABS: 30.0000 mg | ORAL_TABLET | Freq: Every day | ORAL | Status: DC

## 2015-10-24 NOTE — Patient Instructions (Addendum)
intensify lifestyle interventions. To help blood pressure    Take blood pressure readings twice a day for  10 -14days and then periodically .To ensure below 140/90   .Send in readings      However is staying 155 and above need to go on medication in the interim .     Some bp medications also  help headaches   If very high  160 and above begin medication  Prescribed to take every day  Either way plan  To moinitor bp readings when not having a headache   Increase elavil to 30 mg at night    DASH Eating Plan DASH stands for "Dietary Approaches to Stop Hypertension." The DASH eating plan is a healthy eating plan that has been shown to reduce high blood pressure (hypertension). Additional health benefits may include reducing the risk of type 2 diabetes mellitus, heart disease, and stroke. The DASH eating plan may also help with weight loss. WHAT DO I NEED TO KNOW ABOUT THE DASH EATING PLAN? For the DASH eating plan, you will follow these general guidelines:  Choose foods with a percent daily value for sodium of less than 5% (as listed on the food label).  Use salt-free seasonings or herbs instead of table salt or sea salt.  Check with your health care provider or pharmacist before using salt substitutes.  Eat lower-sodium products, often labeled as "lower sodium" or "no salt added."  Eat fresh foods.  Eat more vegetables, fruits, and low-fat dairy products.  Choose whole grains. Look for the word "whole" as the first word in the ingredient list.  Choose fish and skinless chicken or [place] more often than red meat. Limit fish, poultry, and meat to 6 oz (170 g) each day.  Limit sweets, desserts, sugars, and sugary drinks.  Choose heart-healthy fats.  Limit cheese to 1 oz (28 g) per day.  Eat more home-cooked food and less restaurant, buffet, and fast food.  Limit fried foods.  Cook foods using methods other than frying.  Limit canned vegetables. If you do use them, rinse them  well to decrease the sodium.  When eating at a restaurant, ask that your food be prepared with less salt, or no salt if possible. WHAT FOODS CAN I EAT? Seek help from a dietitian for individual calorie needs. Grains Whole grain or whole wheat bread. Brown rice. Whole grain or whole wheat pasta. Quinoa, bulgur, and whole grain cereals. Low-sodium cereals. Corn or whole wheat flour tortillas. Whole grain cornbread. Whole grain crackers. Low-sodium crackers. Vegetables Fresh or frozen vegetables (raw, steamed, roasted, or grilled). Low-sodium or reduced-sodium tomato and vegetable juices. Low-sodium or reduced-sodium tomato sauce and paste. Low-sodium or reduced-sodium canned vegetables.  Fruits All fresh, canned (in natural juice), or frozen fruits. Meat and Other Protein Products Ground beef (85% or leaner), grass-fed beef, or beef trimmed of fat. Skinless chicken or [place]. Ground chicken or [place]. Pork trimmed of fat. All fish and seafood. Eggs. Dried beans, peas, or lentils. Unsalted nuts and seeds. Unsalted canned beans. Dairy Low-fat dairy products, such as skim or 1% milk, 2% or reduced-fat cheeses, low-fat ricotta or cottage cheese, or plain low-fat yogurt. Low-sodium or reduced-sodium cheeses. Fats and Oils Tub margarines without trans fats. Light or reduced-fat mayonnaise and salad dressings (reduced sodium). Avocado. Safflower, olive, or canola oils. Natural peanut or almond butter. Other Unsalted popcorn and pretzels. The items listed above may not be a complete list of recommended foods or beverages. Contact your dietitian for more  options. WHAT FOODS ARE NOT RECOMMENDED? Grains White bread. White pasta. White rice. Refined cornbread. Bagels and croissants. Crackers that contain trans fat. Vegetables Creamed or fried vegetables. Vegetables in a cheese sauce. Regular canned vegetables. Regular canned tomato sauce and paste. Regular tomato and vegetable juices. Fruits Dried  fruits. Canned fruit in light or heavy syrup. Fruit juice. Meat and Other Protein Products Fatty cuts of meat. Ribs, chicken wings, bacon, sausage, bologna, salami, chitterlings, fatback, hot dogs, bratwurst, and packaged luncheon meats. Salted nuts and seeds. Canned beans with salt. Dairy Whole or 2% milk, cream, half-and-half, and cream cheese. Whole-fat or sweetened yogurt. Full-fat cheeses or blue cheese. Nondairy creamers and whipped toppings. Processed cheese, cheese spreads, or cheese curds. Condiments Onion and garlic salt, seasoned salt, table salt, and sea salt. Canned and packaged gravies. Worcestershire sauce. Tartar sauce. Barbecue sauce. Teriyaki sauce. Soy sauce, including reduced sodium. Steak sauce. Fish sauce. Oyster sauce. Cocktail sauce. Horseradish. Ketchup and mustard. Meat flavorings and tenderizers. Bouillon cubes. Hot sauce. Tabasco sauce. Marinades. Taco seasonings. Relishes. Fats and Oils Butter, stick margarine, lard, shortening, ghee, and bacon fat. Coconut, palm kernel, or palm oils. Regular salad dressings. Other Pickles and olives. Salted popcorn and pretzels. The items listed above may not be a complete list of foods and beverages to avoid. Contact your dietitian for more information. WHERE CAN I FIND MORE INFORMATION? National Heart, Lung, and Blood Institute: travelstabloid.com   This information is not intended to replace advice given to you by your health care provider. Make sure you discuss any questions you have with your health care provider.   Document Released: 11/18/2011 Document Revised: 12/20/2014 Document Reviewed: 10/03/2013 Elsevier Interactive Patient Education [employer].

## 2015-10-24 NOTE — Progress Notes (Signed)
Pre visit review using our clinic review tool, if applicable. No additional management support is needed unless otherwise documented below in the visit note.  Chief Complaint  Patient presents with  . Follow-up    ha amenia bp     HPI: Brooke Le 49 y.o.  Fu multiple issues see lat visit    anemia :Taking iron    otc  Once a day.   HAs Had once or twice a week.  No sleep last night  Worked late   On 20 of elavil   bp not checked but thinks went [place] with HA . Doesn't want to go back on bp medication  Unless no other options . No bp monitor at home   hanst done neck US  Plans on schedul Death in family and work trip to [place]  ROS: See pertinent positives and negatives per HPI. No cp sob syncope new neuro sx is taking b12 also    Past Medical History  Diagnosis Date  . Hypertension     had decreased with weight loss  . Obesity   . Migraine headache     hx of seen in [facility] center in the remote past  . Anemia, iron deficiency   . Fibromyalgia   . Allergic rhinitis   . Syncope     ER evaluation in 2008; had anemia then  . Gynecological examination     Dr. Nori Riis  . Low vitamin B12 level   . 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 Headache(784.0) 08/21/2007    Qualifier: Diagnosis of  By: Regis Bill MD, Standley Brooking     Family History  Problem Relation Age of Onset  . Alcohol abuse Other   . Diabetes Other   . Hypertension Other     Social History   Social History  . Marital Status: Married    Spouse Name: N/A  . Number of Children: N/A  . Years of Education: N/A   Social History Main Topics  . Smoking status: Never Smoker   . Smokeless tobacco: Never Used  . Alcohol Use: None  . Drug Use: None  . Sexual Activity: Not Asked   Other Topics Concern  . None   Social History Narrative   Married   Regular Exercise- no   Laid off [employer]- July 2011 lost insurance in [place]   Back to work at  [employer] long shifts    Now am Actuary   hh of 6 teens    No pets                 Outpatient Prescriptions Prior to Visit  Medication Sig Dispense Refill  . METHOCARBAMOL PO Take by mouth.    [name] amitriptyline (ELAVIL) 10 MG tablet Take 1 tablet (10 mg total) by mouth at bedtime. increase  To 2 po hs and then as directed 60 tablet 3   No facility-administered medications prior to visit.     EXAM:  BP 158/82 mmHg  Temp(Src) 98.7 F (37.1 C) (Oral)  Wt 256 lb 9.6 oz (116.393 kg)  Body mass index is 39.02 kg/(m^2).  GENERAL: vitals reviewed and listed above, alert, oriented, appears well hydrated and in no acute distress HEENT: atraumatic, conjunctiva  clear, no obvious abnormalities on inspection of external nose and ears OP : no lesion edema or exudate  NECK: no obvious masses on inspection palpation  LUNGS: clear  to auscultation bilaterally, no wheezes, rales or rhonchi,  CV: HRRR, no clubbing cyanosis or  peripheral edema nl cap refill  MS: moves all extremities without noticeable focal  abnormality PSYCH: pleasant and cooperative, no obvious depression or anxiety Lab Results  Component Value Date   WBC 4.4 09/23/2015   HGB 9.4* 09/23/2015   HCT 30.2* 09/23/2015   PLT 254.0 09/23/2015   GLUCOSE 105* 08/26/2015   CHOL 152 03/26/2015   TRIG 47.0 03/26/2015   HDL 46.20 03/26/2015   LDLCALC 96 03/26/2015   ALT 14 03/26/2015   AST 16 03/26/2015   NA 137 08/26/2015   K 3.4* 08/26/2015   CL 106 08/26/2015   CREATININE 0.87 08/26/2015   BUN <5* 08/26/2015   CO2 24 08/26/2015   TSH 1.09 09/23/2015   HGBA1C 6.0 12/09/2010    ASSESSMENT AND PLAN:  Discussed the following assessment and plan:  Post-traumatic headache, not intractable, unspecified chronicity pattern - inc to 30 mg per night elavil  Elevated BP - disc medication and lsi  if confirmed needs meds pt will get own bp monitor   Anemia, iron deficiency - check today stay on iron or  pending lab - Plan: CBC with Differential/Platelet, IBC panel, Ferritin, Vitamin B12  S/P bariatric surgery - check b12 - Plan: Vitamin B12  Thyroid nodule - still needs  [place] DID not  Get the neck US and abd [place]   -Patient advised to return or notify health care team  if symptoms worsen ,persist or new concerns arise.  Patient Instructions  intensify lifestyle interventions. To help blood pressure    Take blood pressure readings twice a day for  10 -14days and then periodically .To ensure below 140/90   .Send in readings      However is staying 155 and above need to go on medication in the interim .     Some bp medications also  help headaches   If very high  160 and above begin medication  Prescribed to take every day  Either way plan  To moinitor bp readings when not having a headache   Increase elavil to 30 mg at night    DASH Eating Plan DASH stands for "Dietary Approaches to Stop Hypertension." The DASH eating plan is a healthy eating plan that has been shown to reduce high blood pressure (hypertension). Additional health benefits may include reducing the risk of type 2 diabetes mellitus, heart disease, and stroke. The DASH eating plan may also help with weight loss. WHAT DO I NEED TO KNOW ABOUT THE DASH EATING PLAN? For the DASH eating plan, you will follow these general guidelines:  Choose foods with a percent daily value for sodium of less than 5% (as listed on the food label).  Use salt-free seasonings or herbs instead of table salt or sea salt.  Check with your health care provider or pharmacist before using salt substitutes.  Eat lower-sodium products, often labeled as "lower sodium" or "no salt added."  Eat fresh foods.  Eat more vegetables, fruits, and low-fat dairy products.  Choose whole grains. Look for the word "whole" as the first word in the ingredient list.  Choose fish and skinless chicken or [place] more often than red meat. Limit fish, poultry, and meat to  6 oz (170 g) each day.  Limit sweets, desserts, sugars, and sugary drinks.  Choose heart-healthy fats.  Limit cheese to 1 oz (28 g) per day.  Eat more home-cooked food and less restaurant, buffet, and  fast food.  Limit fried foods.  Cook foods using methods other than frying.  Limit canned vegetables. If you do use them, rinse them well to decrease the sodium.  When eating at a restaurant, ask that your food be prepared with less salt, or no salt if possible. WHAT FOODS CAN I EAT? Seek help from a dietitian for individual calorie needs. Grains Whole grain or whole wheat bread. Brown rice. Whole grain or whole wheat pasta. Quinoa, bulgur, and whole grain cereals. Low-sodium cereals. Corn or whole wheat flour tortillas. Whole grain cornbread. Whole grain crackers. Low-sodium crackers. Vegetables Fresh or frozen vegetables (raw, steamed, roasted, or grilled). Low-sodium or reduced-sodium tomato and vegetable juices. Low-sodium or reduced-sodium tomato sauce and paste. Low-sodium or reduced-sodium canned vegetables.  Fruits All fresh, canned (in natural juice), or frozen fruits. Meat and Other Protein Products Ground beef (85% or leaner), grass-fed beef, or beef trimmed of fat. Skinless chicken or [place]. Ground chicken or [place]. Pork trimmed of fat. All fish and seafood. Eggs. Dried beans, peas, or lentils. Unsalted nuts and seeds. Unsalted canned beans. Dairy Low-fat dairy products, such as skim or 1% milk, 2% or reduced-fat cheeses, low-fat ricotta or cottage cheese, or plain low-fat yogurt. Low-sodium or reduced-sodium cheeses. Fats and Oils Tub margarines without trans fats. Light or reduced-fat mayonnaise and salad dressings (reduced sodium). Avocado. Safflower, olive, or canola oils. Natural peanut or almond butter. Other Unsalted popcorn and pretzels. The items listed above may not be a complete list of recommended foods or beverages. Contact your dietitian for more  options. WHAT FOODS ARE NOT RECOMMENDED? Grains White bread. White pasta. White rice. Refined cornbread. Bagels and croissants. Crackers that contain trans fat. Vegetables Creamed or fried vegetables. Vegetables in a cheese sauce. Regular canned vegetables. Regular canned tomato sauce and paste. Regular tomato and vegetable juices. Fruits Dried fruits. Canned fruit in light or heavy syrup. Fruit juice. Meat and Other Protein Products Fatty cuts of meat. Ribs, chicken wings, bacon, sausage, bologna, salami, chitterlings, fatback, hot dogs, bratwurst, and packaged luncheon meats. Salted nuts and seeds. Canned beans with salt. Dairy Whole or 2% milk, cream, half-and-half, and cream cheese. Whole-fat or sweetened yogurt. Full-fat cheeses or blue cheese. Nondairy creamers and whipped toppings. Processed cheese, cheese spreads, or cheese curds. Condiments Onion and garlic salt, seasoned salt, table salt, and sea salt. Canned and packaged gravies. Worcestershire sauce. Tartar sauce. Barbecue sauce. Teriyaki sauce. Soy sauce, including reduced sodium. Steak sauce. Fish sauce. Oyster sauce. Cocktail sauce. Horseradish. Ketchup and mustard. Meat flavorings and tenderizers. Bouillon cubes. Hot sauce. Tabasco sauce. Marinades. Taco seasonings. Relishes. Fats and Oils Butter, stick margarine, lard, shortening, ghee, and bacon fat. Coconut, palm kernel, or palm oils. Regular salad dressings. Other Pickles and olives. Salted popcorn and pretzels. The items listed above may not be a complete list of foods and beverages to avoid. Contact your dietitian for more information. WHERE CAN I FIND MORE INFORMATION? National Heart, Lung, and Blood Institute: travelstabloid.com   This information is not intended to replace advice given to you by your health care provider. Make sure you discuss any questions you have with your health care provider.   Document Released: 11/18/2011  Document Revised: 12/20/2014 Document Reviewed: 10/03/2013 Elsevier Interactive Patient Education 2016 [place]. Maylea Soria M.D.

## 2015-12-19 ENCOUNTER — Ambulatory Visit: Payer: BLUE CROSS/BLUE SHIELD | Admitting: Internal Medicine

## 2015-12-31 ENCOUNTER — Encounter: Payer: BLUE CROSS/BLUE SHIELD | Admitting: Internal Medicine

## 2015-12-31 ENCOUNTER — Telehealth: Payer: Self-pay | Admitting: Family Medicine

## 2015-12-31 NOTE — Telephone Encounter (Signed)
Left a message on home/cell informing the pt that she missed her appointment with [facility].  Asked her to call back and re schedule.  Left office telephone number.

## 2015-12-31 NOTE — Progress Notes (Signed)
Document opened and reviewed for OV but appt  NS same day .   

## 2016-01-23 ENCOUNTER — Encounter: Payer: Self-pay | Admitting: Internal Medicine

## 2016-01-23 ENCOUNTER — Encounter: Payer: BLUE CROSS/BLUE SHIELD | Admitting: Internal Medicine

## 2016-01-23 VITALS — BP 144/84 | Temp 98.7°F | Wt 260.3 lb

## 2016-01-23 NOTE — Progress Notes (Signed)
Document opened and reviewed for OV but   Pt had to leave before  Visit complete Had come in 15 min late and so delay in seeing her    So  appt  canceled same day . Will reschedule . [place]

## 2016-05-19 ENCOUNTER — Ambulatory Visit: Payer: BLUE CROSS/BLUE SHIELD | Admitting: Family Medicine

## 2016-05-20 ENCOUNTER — Other Ambulatory Visit: Payer: Self-pay | Admitting: General Practice

## 2016-05-20 ENCOUNTER — Telehealth: Payer: Self-pay | Admitting: General Practice

## 2016-05-20 NOTE — Telephone Encounter (Signed)
Message left for patient to call office to verify which pharmacy she is currently using.  Re-fill request coming in from [facility] to fill Verapamil but it looks like patient has been using [facility] in [place].  Please verify.

## 2016-05-26 ENCOUNTER — Telehealth: Payer: Self-pay | Admitting: Internal Medicine

## 2016-05-26 NOTE — Telephone Encounter (Signed)
There is no message.

## 2016-05-31 ENCOUNTER — Ambulatory Visit: Payer: BLUE CROSS/BLUE SHIELD | Admitting: Internal Medicine

## 2016-06-01 ENCOUNTER — Ambulatory Visit: Payer: BLUE CROSS/BLUE SHIELD | Admitting: Internal Medicine

## 2016-06-28 ENCOUNTER — Encounter: Payer: Self-pay | Admitting: Internal Medicine

## 2016-06-28 ENCOUNTER — Ambulatory Visit (INDEPENDENT_AMBULATORY_CARE_PROVIDER_SITE_OTHER): Payer: BLUE CROSS/BLUE SHIELD | Admitting: Internal Medicine

## 2016-06-28 VITALS — BP 144/90 | Temp 98.1°F | Ht 68.0 in | Wt 262.1 lb

## 2016-06-28 DIAGNOSIS — Z9884 Bariatric surgery status: Secondary | ICD-10-CM

## 2016-06-28 DIAGNOSIS — N951 Menopausal and female climacteric states: Secondary | ICD-10-CM

## 2016-06-28 DIAGNOSIS — D509 Iron deficiency anemia, unspecified: Secondary | ICD-10-CM | POA: Diagnosis not present

## 2016-06-28 DIAGNOSIS — Z79899 Other long term (current) drug therapy: Secondary | ICD-10-CM

## 2016-06-28 DIAGNOSIS — I10 Essential (primary) hypertension: Secondary | ICD-10-CM | POA: Diagnosis not present

## 2016-06-28 MED ORDER — VERAPAMIL HCL ER 120 MG PO TBCR: 120.0000 mg | EXTENDED_RELEASE_TABLET | Freq: Every day | ORAL | Status: DC

## 2016-06-28 NOTE — Patient Instructions (Signed)
Go back on  Medication .  For hypertension  . Bring in form DMV  and see what we can do . You should be able to drive .  You are anemic    Plan cpx with full labs ih September  Please contact us no later than day before if you need to cancel .
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
## 2016-06-28 NOTE — Progress Notes (Signed)
Pre visit review using our clinic review tool, if applicable. No additional management support is needed unless otherwise documented below in the visit note. 

## 2016-08-09 ENCOUNTER — Encounter: Payer: Self-pay | Admitting: Family Medicine

## 2016-08-09 ENCOUNTER — Ambulatory Visit (INDEPENDENT_AMBULATORY_CARE_PROVIDER_SITE_OTHER)
Admission: RE | Admit: 2016-08-09 | Discharge: 2016-08-09 | Disposition: A | Discharge status: Home / Self Care | Payer: BLUE CROSS/BLUE SHIELD | Source: Ambulatory Visit | Attending: Family Medicine | Admitting: Family Medicine

## 2016-08-09 ENCOUNTER — Ambulatory Visit (INDEPENDENT_AMBULATORY_CARE_PROVIDER_SITE_OTHER): Payer: BLUE CROSS/BLUE SHIELD | Admitting: Family Medicine

## 2016-08-09 VITALS — BP 120/80 | HR 72 | Resp 12 | Ht 68.0 in | Wt 263.1 lb

## 2016-08-09 DIAGNOSIS — S8991XA Unspecified injury of right lower leg, initial encounter: Secondary | ICD-10-CM

## 2016-08-09 DIAGNOSIS — R55 Syncope and collapse: Secondary | ICD-10-CM | POA: Diagnosis not present

## 2016-08-09 MED ORDER — IBUPROFEN 600 MG PO TABS: 600.0000 mg | ORAL_TABLET | Freq: Three times a day (TID) | ORAL | 0 refills | Status: AC | PRN

## 2016-08-09 MED ORDER — KETOROLAC TROMETHAMINE 60 MG/2ML IM SOLN
60.0000 mg | Freq: Once | INTRAMUSCULAR | Status: AC
  Administered 2016-08-09: 60 mg via INTRAMUSCULAR

## 2016-08-09 NOTE — Progress Notes (Signed)
Pre visit review using our clinic review tool, if applicable. No additional management support is needed unless otherwise documented below in the visit note. 

## 2016-08-09 NOTE — Patient Instructions (Signed)
  Brooke Le I have seen you today for an acute visit because your primary care provider was not available.   1. Knee injury, right, initial encounter Elevation. Local ice x 48 hours. Ibuprofen 600 mg 3 times daily with food, start in 4-6 hours.  Tylenol 500 mg 4 times daily.  It may take a few week for pain to resolved. May need orthopedists evaluation if not better in a few weeks. - DG Knee Complete 4 Views Right; Future  2. Near syncope Since you are feeling better no further studies today, please follow with your doctor if similar episode happens.     Monitor for signs of worsening symptoms and seek immediate medical attention if any concerning/warning symptom as we discussed.   If symptoms are not resolved in 2-3 weeks you should schedule a follow up appointment with your doctor, before if symptoms get worse.  Please be sure you have an appointment already scheduled with your PCP.

## 2016-08-09 NOTE — Progress Notes (Signed)
HPI:  ACUTE VISIT:  Chief Complaint  Patient presents with  . Knee Pain    Fell on saturday on a sidewalk, pt has used biofreeze & knee brace, but still in pain    Brooke Le is a 50 y.o. female, who is here today complaining of knee pain after fall on 08/07/16.   She was leaning against furniture because dizziness and hand slipped and she fell, "fainted", denies head trauma or LOC. EMS was called and according to pt, BP was low, 90/50, Dx with dehydration, IVF given. Symptoms resolved.  Denies severe/frequent headache, visual changes, chest pain, dyspnea, palpitation, claudication, focal weakness, or edema.  S/P BTL. LMP 2 months ago, perimenopausal.  Right knee pain and edema noted right after injury and getting worse. Pain is constant, exacerbated by movement and work, alleviated by rest, Max 8/10, sharp. She denies any numbness, tingling, cold extremities, or cyanosis. No prior history of knee pain. She has not tried oral medications OTC for pain. Topical Bio Freeze.    Review of Systems  Constitutional: Negative for appetite change, fatigue, fever and unexpected weight change.  Eyes: Negative for pain, redness and visual disturbance.  Respiratory: Negative for cough, shortness of breath and wheezing.   Cardiovascular: Negative for chest pain, palpitations and leg swelling.  Musculoskeletal: Positive for arthralgias. Negative for myalgias.  Skin: Negative for pallor, rash and wound.  Neurological: Negative for seizures, syncope, weakness, numbness and headaches.  Psychiatric/Behavioral: Negative for confusion. The patient is not nervous/anxious.       Current Outpatient Prescriptions on File Prior to Visit  Medication Sig Dispense Refill  . amitriptyline (ELAVIL) 10 MG tablet Take 3 tablets (30 mg total) by mouth at bedtime. 90 tablet 3  . METHOCARBAMOL PO Take by mouth.    . verapamil (CALAN-SR) 120 MG CR tablet Take 1 tablet (120 mg total)  by mouth daily. 30 tablet 5   No current facility-administered medications on file prior to visit.      Past Medical History:  Diagnosis Date  . Allergic rhinitis   . Anemia, iron deficiency   . Fibromyalgia   . Gynecological examination    Dr. Nori Le  . [id number]) 08/21/2007   Qualifier: Diagnosis of  By: Brooke Bill MD, Brooke Le   . Hypertension    had decreased with weight loss  . Low vitamin B12 level   . Migraine headache    hx of seen in [facility] center in the remote past  . Obesity   . 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 Syncope    ER evaluation in 2008; had anemia then   Allergies  Allergen Reactions  . Bee Pollen Swelling    Social History   Social History  . Marital status: Married    Spouse name: N/A  . Number of children: N/A  . Years of education: N/A   Social History Main Topics  . Smoking status: Never Smoker  . Smokeless tobacco: Never Used  . Alcohol use None  . Drug use: Unknown  . Sexual activity: Not Asked   Other Topics Concern  . None   Social History Narrative   Married   Regular Exercise- no   Laid off [employer]- July 2011 lost insurance in [place] to work at [employer] long shifts    Now Brooke Le Actuary   hh of 6 teens  No pets                 Vitals:   08/09/16 1415  BP: 120/80  Pulse: 72  Resp: 12   O2 sat at RA 98%.  Body mass index is 40.01 kg/m.      Physical Exam  Nursing note and vitals reviewed. Constitutional: She is oriented to person, place, and time. She appears well-developed. No distress.  HENT:  Head: Atraumatic.  Mouth/Throat: Oropharynx is clear and moist and mucous membranes are normal.  Eyes: Conjunctivae and EOM are normal.  Cardiovascular: Normal rate and regular rhythm.   No murmur heard. Pulses:      Dorsalis pedis pulses are 2+ on the right side, and 2+ on the left side.  Respiratory: Effort normal and  breath sounds normal. No respiratory distress.  Musculoskeletal: She exhibits edema (1+ pitting LE edema bilateral.).  R Knee: guarded, so evaluation limited. + Effusion, tenderness upon palpation of lateral aspect, interarticular line, no deformity or erythema.  Neurological: She is alert and oriented to person, place, and time. She has normal strength. She displays a negative Romberg sign. Coordination normal.  Skin: Skin is warm. No erythema.  Psychiatric: She has a normal mood and affect.  Well groomed, good eye contact.      ASSESSMENT AND PLAN:     Brooke Le was seen today for knee pain.  Diagnoses and all orders for this visit:  Knee injury, right, initial encounter  Here in the office after verbal consent Toradol 60 mg IM x 1 given.No Hx of CKD. Ibuprofen tid to continue, some side effects discussed but as far as BP is well controlled she can take med for 5-7 days and can combine with Tylenol. Plain imaging ordered today. Note, work excuse given. LE elevation. F/U as needed.   -     DG Knee Complete 4 Views Right; Future -     ibuprofen (ADVIL,MOTRIN) 600 MG tablet; Take 1 tablet (600 mg total) by mouth every 8 (eight) hours as needed. -     ketorolac (TORADOL) injection 60 mg; Inject 2 mLs (60 mg total) into the muscle once.  Near syncope   ? Vaso vagal, asymptomatic now. Adequate hydration. No further work-up recommended but instructed about warning signs.      Return if symptoms worsen or fail to improve.     -[name] advised to return or notify a doctor immediately if symptoms worsen or persist or new concerns arise.       Andrzej Scully G. [place], MD  [facility] [place] office.

## 2016-08-10 ENCOUNTER — Telehealth: Payer: Self-pay | Admitting: Internal Medicine

## 2016-08-10 NOTE — Telephone Encounter (Signed)
Results given to patient & patient verbalized understanding.

## 2016-08-10 NOTE — Telephone Encounter (Signed)
° °  Pt call to ask for the results of her xray

## 2016-08-23 ENCOUNTER — Encounter: Payer: Self-pay | Admitting: Internal Medicine

## 2016-08-23 ENCOUNTER — Ambulatory Visit (INDEPENDENT_AMBULATORY_CARE_PROVIDER_SITE_OTHER): Payer: BLUE CROSS/BLUE SHIELD | Admitting: Internal Medicine

## 2016-08-23 VITALS — BP 144/78 | Temp 98.2°F | Wt 266.6 lb

## 2016-08-23 DIAGNOSIS — S8991XS Unspecified injury of right lower leg, sequela: Secondary | ICD-10-CM | POA: Diagnosis not present

## 2016-08-23 DIAGNOSIS — I10 Essential (primary) hypertension: Secondary | ICD-10-CM

## 2016-08-23 DIAGNOSIS — M25561 Pain in right knee: Secondary | ICD-10-CM | POA: Diagnosis not present

## 2016-08-23 DIAGNOSIS — Z9189 Other specified personal risk factors, not elsewhere classified: Secondary | ICD-10-CM

## 2016-08-23 DIAGNOSIS — Z87898 Personal history of other specified conditions: Secondary | ICD-10-CM

## 2016-08-23 DIAGNOSIS — M25461 Effusion, right knee: Secondary | ICD-10-CM

## 2016-08-23 NOTE — Progress Notes (Signed)
Pre visit review using our clinic review tool, if applicable. No additional management support is needed unless otherwise documented below in the visit note. 

## 2016-08-23 NOTE — Patient Instructions (Addendum)
Referral  To see orthopedics  because of continued  Swelling and pain .  Right knee .   May take  4-8 weeks to heal an strain injury at least.  You can change    Make sure your bp is  In range     Today 144/78  Make an appt for a cpx when you can come .  Labs   Lab Results  Component Value Date   WBC 4.0 10/24/2015   HGB 9.4 (L) 10/24/2015   HCT 29.3 (L) 10/24/2015   PLT 293.0 10/24/2015   GLUCOSE 105 (H) 08/26/2015   CHOL 152 03/26/2015   TRIG 47.0 03/26/2015   HDL 46.20 03/26/2015   LDLCALC 96 03/26/2015   ALT 14 03/26/2015   AST 16 03/26/2015   NA 137 08/26/2015   K 3.4 (L) 08/26/2015   CL 106 08/26/2015   CREATININE 0.87 08/26/2015   BUN <5 (L) 08/26/2015   CO2 24 08/26/2015   TSH 1.09 09/23/2015   HGBA1C 6.0 12/09/2010

## 2016-08-23 NOTE — Progress Notes (Signed)
Chief Complaint  Patient presents with  . Follow-up    HPI: Brooke Le 50 y.o.  comesin for  Fu   Knee pain , injury ... Had knee injury  ? twistede not direct fall 2 weeks ago .   Had dehydration and had syncope.  In the heat and then fell. Very hot day and  Working outside  [employer].  No food .   And passes out about 3-4 pm .    No sig fluids before episode that day working  August 26th  .  No dizziness   People caught her  When she  slumped   .  ambulance and  bp was  Very low   And  Had iv fluids and ekg  In ambulance and was ok . Then    130/ range  The worse throughout the night.  Saw Dr Martinique  Neg x ray and ibu but still painful swoleen  And hurts to walk  Lateral knee pain  No catching worse with flexion   No poppng or giving out.  No noise and hurts  Ibuprofen 600  And biofreeze  Continues  To hurt and and hard to lift  Leg aout of car  Lateral  Pain      ROS: See pertinent positives and negatives per HPI.  Past Medical History:  Diagnosis Date  . Allergic rhinitis   . Anemia, iron deficiency   . Fibromyalgia   . Gynecological examination    Dr. Nori Riis  . [id number]) 08/21/2007   Qualifier: Diagnosis of  By: Regis Bill MD, Standley Brooking   . Hypertension    had decreased with weight loss  . Low vitamin B12 level   . Migraine headache    hx of seen in [facility] center in the remote past  . Obesity   . 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 Syncope    ER evaluation in 2008; had anemia then    Family History  Problem Relation Age of Onset  . Alcohol abuse Other   . Diabetes Other   . Hypertension Other     Social History   Social History  . Marital status: Married    Spouse name: N/A  . Number of children: N/A  . Years of education: N/A   Social History Main Topics  . Smoking status: Never Smoker  . Smokeless tobacco: Never Used  . Alcohol use None  . Drug use: Unknown  . Sexual activity: Not  Asked   Other Topics Concern  . None   Social History Narrative   Married   Regular Exercise- no   Laid off [employer]- July 2011 lost insurance in [place]   Back 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    No pets                 Outpatient Medications Prior to Visit  Medication Sig Dispense Refill  . amitriptyline (ELAVIL) 10 MG tablet Take 3 tablets (30 mg total) by mouth at bedtime. 90 tablet 3  . METHOCARBAMOL PO Take by mouth.    . verapamil (CALAN-SR) 120 MG CR tablet Take 1 tablet (120 mg total) by mouth daily. 30 tablet 5   No facility-administered medications prior to visit.      EXAM:  BP (!) 144/78 (BP Location: Right Arm,  Patient Position: Sitting, Cuff Size: Large)   Temp 98.2 F (36.8 C) (Oral)   Wt 266 lb 9.6 oz (120.9 kg)   BMI 40.54 kg/m   Body mass index is 40.54 kg/m.  GENERAL: vitals reviewed and listed above, alert, oriented, appears well hydrated and in no acute distress HEENT: atraumatic, conjunctiva  clear, no obvious abnormalities on inspection of external nose and ears NECK: no obvious masses on inspection palpation  LUNGS: clear to auscultation bilaterally, no wheezes, rales or rhonchi,  CV: HRRR, no clubbing cyanosis or  peripheral edema nl cap refill  MS: moves all extremities   Limps  Right knee  Pain with flexion seems stable    1 + swelling compared to left  No warmth  crepitus  Drawer ? =  Neg NV  PSYCH: pleasant and cooperative, no obvious depression or anxiety  ASSESSMENT AND PLAN:  Discussed the following assessment and plan:  Knee injury, right, sequela - Plan: Ambulatory referral to Orthopedic Surgery  Pain and swelling of knee, right - Plan: Ambulatory referral to Orthopedic Surgery  Essential hypertension  Hx of syncope - prob heat and hydration related  Continued swelling and significant  pain and limping  Over 2 weeks   After injury non contact     Ortho referral    Expectant management.   Due for labs and  cpx   Get at least a lab appt  Hx anemia  Potassium low   Last year  Told she may be at ridk for heat related illness in the future and thus preventive measures to be taken .  -Patient advised to return or notify health care team  if symptoms worsen ,persist or new concerns arise.   Standley Brooking. Natsumi Whitsitt M.D.

## 2016-08-31 ENCOUNTER — Other Ambulatory Visit: Payer: BLUE CROSS/BLUE SHIELD

## 2016-09-01 ENCOUNTER — Other Ambulatory Visit (INDEPENDENT_AMBULATORY_CARE_PROVIDER_SITE_OTHER): Payer: BLUE CROSS/BLUE SHIELD

## 2016-09-01 DIAGNOSIS — Z0000 Encounter for general adult medical examination without abnormal findings: Secondary | ICD-10-CM

## 2016-09-01 LAB — BASIC METABOLIC PANEL
BUN: 13 mg/dL (ref 6–23)
CHLORIDE: 105 meq/L (ref 96–112)
CO2: 28 meq/L (ref 19–32)
CREATININE: 0.93 mg/dL (ref 0.40–1.20)
Calcium: 8.4 mg/dL (ref 8.4–10.5)
GFR: 81.91 mL/min (ref >60.00)
GLUCOSE: 83 mg/dL (ref 70–99)
Potassium: 4.3 mEq/L (ref 3.5–5.1)
Sodium: 138 mEq/L (ref 135–145)

## 2016-09-01 LAB — HEPATIC FUNCTION PANEL
ALT: 10 U/L (ref 0–35)
AST: 13 U/L (ref 0–37)
Albumin: 3.5 g/dL (ref 3.5–5.2)
Alkaline Phosphatase: 121 U/L — ABNORMAL HIGH (ref 39–117)
BILIRUBIN DIRECT: 0.1 mg/dL (ref 0.0–0.3)
BILIRUBIN TOTAL: 0.4 mg/dL (ref 0.2–1.2)
Total Protein: 6.4 g/dL (ref 6.0–8.3)

## 2016-09-01 LAB — CBC WITH DIFFERENTIAL/PLATELET
BASOS PCT: 0.4 % (ref 0.0–3.0)
Basophils Absolute: 0 10*3/uL (ref 0.0–0.1)
EOS ABS: 0.2 10*3/uL (ref 0.0–0.7)
EOS PCT: 4.3 % (ref 0.0–5.0)
HCT: 30.5 % — ABNORMAL LOW (ref 36.0–46.0)
Hemoglobin: 10.1 g/dL — ABNORMAL LOW (ref 12.0–15.0)
LYMPHS ABS: 2.2 10*3/uL (ref 0.7–4.0)
Lymphocytes Relative: 42.2 % (ref 12.0–46.0)
MCHC: 32.9 g/dL (ref 30.0–36.0)
MCV: 82 fl (ref 78.0–100.0)
MONO ABS: 0.3 10*3/uL (ref 0.1–1.0)
Monocytes Relative: 6.5 % (ref 3.0–12.0)
NEUTROS ABS: 2.5 10*3/uL (ref 1.4–7.7)
Neutrophils Relative %: 46.6 % (ref 43.0–77.0)
PLATELETS: 267 10*3/uL (ref 150.0–400.0)
RBC: 3.73 Mil/uL — ABNORMAL LOW (ref 3.87–5.11)
RDW: 16.1 % — AB (ref 11.5–15.5)
WBC: 5.3 10*3/uL (ref 4.0–10.5)

## 2016-09-01 LAB — LIPID PANEL
CHOL/HDL RATIO: 3
CHOLESTEROL: 158 mg/dL (ref 0–200)
HDL: 52.7 mg/dL (ref >39.00)
LDL CALC: 94 mg/dL (ref 0–99)
NonHDL: 105.29
TRIGLYCERIDES: 56 mg/dL (ref 0.0–149.0)
VLDL: 11.2 mg/dL (ref 0.0–40.0)

## 2016-09-01 LAB — TSH: TSH: 1.5 u[IU]/mL (ref 0.35–4.50)

## 2016-09-07 ENCOUNTER — Ambulatory Visit (INDEPENDENT_AMBULATORY_CARE_PROVIDER_SITE_OTHER): Payer: BLUE CROSS/BLUE SHIELD | Admitting: Internal Medicine

## 2016-09-07 ENCOUNTER — Other Ambulatory Visit (HOSPITAL_COMMUNITY)
Admission: RE | Admit: 2016-09-07 | Discharge: 2016-09-07 | Disposition: A | Discharge status: Home / Self Care | Payer: BLUE CROSS/BLUE SHIELD | Source: Ambulatory Visit | Attending: Internal Medicine | Admitting: Internal Medicine

## 2016-09-07 ENCOUNTER — Encounter: Payer: Self-pay | Admitting: Internal Medicine

## 2016-09-07 VITALS — BP 150/80 | Temp 98.6°F | Ht 68.5 in | Wt 266.0 lb

## 2016-09-07 DIAGNOSIS — Z1211 Encounter for screening for malignant neoplasm of colon: Secondary | ICD-10-CM

## 2016-09-07 DIAGNOSIS — I10 Essential (primary) hypertension: Secondary | ICD-10-CM

## 2016-09-07 DIAGNOSIS — N951 Menopausal and female climacteric states: Secondary | ICD-10-CM

## 2016-09-07 DIAGNOSIS — Z1151 Encounter for screening for human papillomavirus (HPV): Secondary | ICD-10-CM | POA: Diagnosis present

## 2016-09-07 DIAGNOSIS — R748 Abnormal levels of other serum enzymes: Secondary | ICD-10-CM

## 2016-09-07 DIAGNOSIS — Z9884 Bariatric surgery status: Secondary | ICD-10-CM

## 2016-09-07 DIAGNOSIS — Z01419 Encounter for gynecological examination (general) (routine) without abnormal findings: Secondary | ICD-10-CM

## 2016-09-07 DIAGNOSIS — Z0000 Encounter for general adult medical examination without abnormal findings: Secondary | ICD-10-CM | POA: Diagnosis not present

## 2016-09-07 DIAGNOSIS — Z01411 Encounter for gynecological examination (general) (routine) with abnormal findings: Secondary | ICD-10-CM | POA: Insufficient documentation

## 2016-09-07 DIAGNOSIS — Z79899 Other long term (current) drug therapy: Secondary | ICD-10-CM

## 2016-09-07 DIAGNOSIS — Z2821 Immunization not carried out because of patient refusal: Secondary | ICD-10-CM

## 2016-09-07 DIAGNOSIS — D509 Iron deficiency anemia, unspecified: Secondary | ICD-10-CM

## 2016-09-07 MED ORDER — VERAPAMIL HCL ER 180 MG PO TBCR: 180.0000 mg | EXTENDED_RELEASE_TABLET | Freq: Every day | ORAL | 3 refills | Status: DC

## 2016-09-07 NOTE — Patient Instructions (Signed)
Restart b12 1000 per day  And vit d 1000 - 2000 iu per day  As well as ytour iron.  You may not absorb these nutrients  In diet  As wellbecause of the  Bypass surgery .  Change bp med to stlightly higher dose . Get rid of the sugar drinks   To avoid wieght gain and diabetes.  Some weight loss willprobably help the swelling .   If the sswelling is ongogin and problematic contact us for poss other interventions .   bp control is below 140/90  Will notify you when pap results are available. Get a mammogram.  Will be contacted about colonoscopy screening referral.   Plan labs in 3 months     bp fu   My chart or other.

## 2016-09-07 NOTE — Progress Notes (Signed)
Pre visit review using our clinic review tool, if applicable. No additional management support is needed unless otherwise documented below in the visit note. 

## 2016-09-07 NOTE — Progress Notes (Signed)
Chief Complaint  Patient presents with  . Annual Exam    HPI: Patient  Brooke Le  50 y.o. comes in today for Preventive Health Care visit   Health Maintenance  Topic Date Due  . PAP SMEAR  08/22/2011  . MAMMOGRAM  03/06/2016  . COLONOSCOPY  03/06/2016  . INFLUENZA VACCINE  05/12/2017 (Originally 07/13/2016)  . HIV Screening  08/23/2017 (Originally 03/06/1981)  . TETANUS/TDAP  08/21/2018   Health Maintenance Review LIFESTYLE:  Exercise:   Tobacco/ETS: Alcohol:  Sugar beverages: Sleep: Drug use: no HH of  Work:    ROS:  GEN/ HEENT: No fever, significant weight changes sweats headaches vision problems hearing changes, CV/ PULM; No chest pain shortness of breath cough, syncope,edema  change in exercise tolerance. GI /GU: No adominal pain, vomiting, change in bowel habits. No blood in the stool. No significant GU symptoms. SKIN/HEME: ,no acute skin rashes suspicious lesions or bleeding. No lymphadenopathy, nodules, masses.  NEURO/ PSYCH:  No neurologic signs such as weakness numbness. No depression anxiety. IMM/ Allergy: No unusual infections.  Allergy .   REST of 12 system review negative except as per HPI   Past Medical History:  Diagnosis Date  . Allergic rhinitis   . Anemia, iron deficiency   . Fibromyalgia   . Gynecological examination    Dr. Nori Riis  . [id number]) 08/21/2007   Qualifier: Diagnosis of  By: Regis Bill MD, Standley Brooking   . Hypertension    had decreased with weight loss  . Low vitamin B12 level   . Migraine headache    hx of seen in [facility] center in the remote past  . Obesity   . 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 Syncope    ER evaluation in 2008; had anemia then    Past Surgical History:  Procedure Laterality Date  . ADENOIDECTOMY    . ESOPHAGOGASTRODUODENOSCOPY  10/31/2002  . GASTRIC BYPASS    . TUBAL LIGATION      Family History  Problem Relation Age of Onset  .  Alcohol abuse Other   . Diabetes Other   . Hypertension Other     Social History   Social History  . Marital status: Married    Spouse name: N/A  . Number of children: N/A  . Years of education: N/A   Social History Main Topics  . Smoking status: Never Smoker  . Smokeless tobacco: Never Used  . Alcohol use None  . Drug use: Unknown  . Sexual activity: Not Asked   Other Topics Concern  . None   Social History Narrative   Married   Regular Exercise- no   Laid off [employer]- July 2011 lost insurance in [name]   Back 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    No pets                 Outpatient Medications Prior to Visit  Medication Sig Dispense Refill  . amitriptyline (ELAVIL) 10 MG tablet Take 3 tablets (30 mg total) by mouth at bedtime. 90 tablet 3  . METHOCARBAMOL PO Take by mouth.    . verapamil (CALAN-SR) 120 MG CR tablet Take 1 tablet (120 mg total) by mouth daily. 30 tablet 5   No facility-administered medications prior to visit.      EXAM:  BP (!) 150/80 (BP Location:  Right Arm, Patient Position: Sitting, Cuff Size: Large)   Temp 98.6 F (37 C) (Oral)   Ht 5' 8.5" (1.74 m)   Wt 266 lb (120.7 kg)   BMI 39.86 kg/m   Body mass index is 39.86 kg/m.  Physical Exam: Vital signs reviewed [id number] is a well-developed well-nourished alert cooperative    who appearsr stated age in no acute distress.  HEENT: normocephalic atraumatic , Eyes: PERRL EOM's full, conjunctiva clear, Nares: paten,t no deformity discharge or tenderness., Ears: no deformity EAC's clear TMs with normal landmarks. Mouth: clear OP, no lesions, edema.  Moist mucous membranes. Dentition in adequate repair. NECK: supple without masses, thyromegaly or bruits. CHEST/PULM:  Clear to auscultation and percussion breath sounds equal no wheeze , rales or rhonchi. No chest wall deformities or tenderness. CV: PMI is nondisplaced, S1 S2 no  gallops, murmurs, rubs. Peripheral pulses are full without delay.No JVD .  ABDOMEN: Bowel sounds normal nontender  No guard or rebound, no hepato splenomegal no CVA tenderness.  No hernia. Extremtities:  No clubbing cyanosis or edema, no acute joint swelling or redness no focal atrophy NEURO:  Oriented x3, cranial nerves 3-12 appear to be intact, no obvious focal weakness,gait within normal limits no abnormal reflexes or asymmetrical SKIN: No acute rashes normal turgor, color, no bruising or petechiae. PSYCH: Oriented, good eye contact, no obvious depression anxiety, cognition and judgment appear normal. LN: no cervical axillary inguinal adenopathy  Lab Results  Component Value Date   WBC 5.3 09/01/2016   HGB 10.1 (L) 09/01/2016   HCT 30.5 (L) 09/01/2016   PLT 267.0 09/01/2016   GLUCOSE 83 09/01/2016   CHOL 158 09/01/2016   TRIG 56.0 09/01/2016   HDL 52.70 09/01/2016   LDLCALC 94 09/01/2016   ALT 10 09/01/2016   AST 13 09/01/2016   NA 138 09/01/2016   K 4.3 09/01/2016   CL 105 09/01/2016   CREATININE 0.93 09/01/2016   BUN 13 09/01/2016   CO2 28 09/01/2016   TSH 1.50 09/01/2016   HGBA1C 6.0 12/09/2010    ASSESSMENT AND PLAN:  Discussed the following assessment and plan:  Encounter for routine gynecological examination - Plan: PAP [[facility]]  Routine general medical examination at a health care facility - Plan: PAP [[place]]  Patient Care Team: Burnis Medin, MD as PCP - General There are no Patient Instructions on file for this visit.  Standley Brooking. Lycia Sachdeva M.D.

## 2016-09-07 NOTE — Progress Notes (Signed)
Chief Complaint  Patient presents with  . Annual Exam    HPI: Patient  Brooke Le  50 y.o. comes in today for Preventive Health Care visit  Due for pap  Just had menses off for 4 months nl for her  No hit flushes  Under knee eval  See past notes   Health Maintenance  Topic Date Due  . PAP SMEAR  08/22/2011  . MAMMOGRAM  03/06/2016  . COLONOSCOPY  03/06/2016  . INFLUENZA VACCINE  05/12/2017 (Originally 07/13/2016)  . HIV Screening  08/23/2017 (Originally 03/06/1981)  . TETANUS/TDAP  08/21/2018   Health Maintenance Review LIFESTYLE:  Exercise:  Not a lot Tobacco/ETS:n Alcohol: n Sugar beverages: "dont ask" uses caff etc to stay awake work  Sleep: off ierrg 6 hours? Drug use: no HH of "to many"  2 daught husband  Aunt caretaking  Work: yes supervisor     ROS: swelling geet when legs down all day  Getting stocking  Worse in hot weather  GEN/ HEENT: No fever, significant weight changes sweats headaches vision problems hearing changes, CV/ PULM; No chest pain shortness of breath cough, syncope,  change in exercise tolerance. GI /GU: No adominal pain, vomiting, change in bowel habits. No blood in the stool. No significant GU symptoms. SKIN/HEME: ,no acute skin rashes suspicious lesions or bleeding. No lymphadenopathy, nodules, masses.  NEURO/ PSYCH:  No neurologic signs such as weakness numbness. No depression anxiety. IMM/ Allergy: No unusual infections.  Allergy .   REST of 12 system review negative except as per HPI   Past Medical History:  Diagnosis Date  . Allergic rhinitis   . Anemia, iron deficiency   . Fibromyalgia   . Gynecological examination    Dr. Nori Riis  . [id number]) 08/21/2007   Qualifier: Diagnosis of  By: Regis Bill MD, Standley Brooking   . Hypertension    had decreased with weight loss  . Low vitamin B12 level   . Migraine headache    hx of seen in [facility] center in the remote past  . Obesity   . 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 Syncope    ER evaluation in 2008; had anemia then    Past Surgical History:  Procedure Laterality Date  . ADENOIDECTOMY    . ESOPHAGOGASTRODUODENOSCOPY  10/31/2002  . GASTRIC BYPASS    . TUBAL LIGATION      Family History  Problem Relation Age of Onset  . Alcohol abuse Other   . Diabetes Other   . Hypertension Other     Social History   Social History  . Marital status: Married    Spouse name: N/A  . Number of children: N/A  . Years of education: N/A   Social History Main Topics  . Smoking status: Never Smoker  . Smokeless tobacco: Never Used  . Alcohol use None  . Drug use: Unknown  . Sexual activity: Not Asked   Other Topics Concern  . None   Social History Narrative   Married   Regular Exercise- no   Laid off [employer]- July 2011 lost insurance in [place]   Back 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    No pets                 Outpatient Medications Prior to Visit  Medication Sig Dispense  Refill  . amitriptyline (ELAVIL) 10 MG tablet Take 3 tablets (30 mg total) by mouth at bedtime. 90 tablet 3  . METHOCARBAMOL PO Take by mouth.    . verapamil (CALAN-SR) 120 MG CR tablet Take 1 tablet (120 mg total) by mouth daily. 30 tablet 5   No facility-administered medications prior to visit.      EXAM:  BP (!) 150/80 (BP Location: Right Arm, Patient Position: Sitting, Cuff Size: Large)   Temp 98.6 F (37 C) (Oral)   Ht 5' 8.5" (1.74 m)   Wt 266 lb (120.7 kg)   BMI 39.86 kg/m   Body mass index is 39.86 kg/m.  Physical Exam: Vital signs reviewed [id number] is a well-developed well-nourished alert cooperative    who appearsr stated age in no acute distress.  HEENT: normocephalic atraumatic , Eyes: PERRL EOM's full, conjunctiva clear, Nares: paten,t no deformity discharge or tenderness., Ears: no deformity EAC's clear TMs with normal landmarks. Mouth: clear  OP, no lesions, edema.  Moist mucous membranes. Dentition in adequate repair. NECK: supple without masses, thyromegaly or bruits. CHEST/PULM:  Clear to auscultation and percussion breath sounds equal no wheeze , rales or rhonchi. No chest wall deformities or tenderness.Breast: normal by inspection . No dimpling, discharge, masses, tenderness or discharge . CV: PMI is nondisplaced, S1 S2 no gallops, murmurs, rubs. Peripheral pulses are full without delay.No JVD .  ABDOMEN: Bowel sounds normal nontender  No guard or rebound, no hepato splenomegal no CVA tenderness.  No hernia. Extremtities:  No clubbing cyanosis or 1+ edema no acute joint swelling or redness no focal atrophy NEURO:  Oriented x3, cranial nerves 3-12 appear to be intact, no obvious focal weakness,gait within normal limits no abnormal reflexes or asymmetrical SKIN: No acute rashes normal turgor, color, no bruising or petechiae. PSYCH: Oriented, good eye contact, no obvious depression anxiety, cognition and judgment appear normal. LN: no cervical axillary inguinal adenopathy Pelvic: NL ext GU, labia clear without lesions or rash . Vagina no lesions .Cervix: clear  UTERUS: Neg CMT Adnexa:  clear no masses . Obvious PAP done hpv cotesting  Slight blood with brush nolesions Rectal no mass stoll heme negative    Lab Results  Component Value Date   WBC 5.3 09/01/2016   HGB 10.1 (L) 09/01/2016   HCT 30.5 (L) 09/01/2016   PLT 267.0 09/01/2016   GLUCOSE 83 09/01/2016   CHOL 158 09/01/2016   TRIG 56.0 09/01/2016   HDL 52.70 09/01/2016   LDLCALC 94 09/01/2016   ALT 10 09/01/2016   AST 13 09/01/2016   NA 138 09/01/2016   K 4.3 09/01/2016   CL 105 09/01/2016   CREATININE 0.93 09/01/2016   BUN 13 09/01/2016   CO2 28 09/01/2016   TSH 1.50 09/01/2016   HGBA1C 6.0 12/09/2010   Wt Readings from Last 3 Encounters:  09/07/16 266 lb (120.7 kg)  08/23/16 266 lb 9.6 oz (120.9 kg)  08/09/16 263 lb 2 oz (119.4 kg)   BP Readings from Last 3  Encounters:  09/07/16 (!) 150/80  08/23/16 (!) 144/78  08/09/16 120/80    ASSESSMENT AND PLAN:  Discussed the following assessment and plan:  Routine general medical examination at a health care facility - Plan: PAP [[place]]  Encounter for routine gynecological examination - Plan: PAP [[facility]]  Essential hypertension - inc to 180 mg send in readings  Perimenopausal  PSTPRC STATUS, BARIATRIC SURGERY - at risk low b12 in past take d b12 iron labs in 3 months  Anemia, iron deficiency - better cont iron   Colon cancer screening - refer for colon fam hx colon cancer  - Plan: Ambulatory referral to Gastroenterology  Medication management  Influenza vaccination declined  Alkaline phosphatase raised - improved  check vit d level  Patient Care Team: Burnis Medin, MD as PCP - General Patient Instructions  Restart b12 1000 per day  And vit d 1000 - 2000 iu per day  As well as ytour iron.  You may not absorb these nutrients  In diet  As wellbecause of the  Bypass surgery .  Change bp med to stlightly higher dose . Get rid of the sugar drinks   To avoid wieght gain and diabetes.  Some weight loss willprobably help the swelling .   If the sswelling is ongogin and problematic contact us for poss other interventions .   bp control is below 140/90  Will notify you when pap results are available. Get a mammogram.  Will be contacted about colonoscopy screening referral.   Plan labs in 3 months     bp fu   My chart or other.    Standley Brooking. Panosh M.D.  After patient left reviewed records more uncertain if she ever got her ultrasound abdomen or neck looking at incidental findings thyroid nodule. Will ask her at next contact.

## 2016-09-08 LAB — CYTOLOGY - PAP

## 2016-09-10 NOTE — Progress Notes (Signed)
Tell patient PAP is normal for malignancy . HPV high  risk is negative DId  You ever get her neck ultrasound for the nodule and the abdomen  Ultrasound as planned ? I dont see  Results   in the record .

## 2016-10-11 ENCOUNTER — Encounter: Payer: Self-pay | Admitting: Internal Medicine

## 2016-12-07 ENCOUNTER — Other Ambulatory Visit: Payer: Self-pay | Admitting: Family Medicine

## 2016-12-07 ENCOUNTER — Other Ambulatory Visit: Payer: BLUE CROSS/BLUE SHIELD

## 2016-12-07 DIAGNOSIS — R748 Abnormal levels of other serum enzymes: Secondary | ICD-10-CM

## 2016-12-07 DIAGNOSIS — Z9884 Bariatric surgery status: Secondary | ICD-10-CM

## 2016-12-07 DIAGNOSIS — D649 Anemia, unspecified: Secondary | ICD-10-CM

## 2016-12-14 ENCOUNTER — Telehealth: Payer: Self-pay | Admitting: Internal Medicine

## 2016-12-14 DIAGNOSIS — Z1211 Encounter for screening for malignant neoplasm of colon: Secondary | ICD-10-CM

## 2016-12-14 NOTE — Telephone Encounter (Signed)
Noted  

## 2016-12-14 NOTE — Telephone Encounter (Signed)
Sent in referral

## 2016-12-14 NOTE — Telephone Encounter (Signed)
Pt is 51 years old and would like to schedule colonoscopy due to age. Please put referral in

## 2016-12-16 ENCOUNTER — Other Ambulatory Visit: Payer: BLUE CROSS/BLUE SHIELD

## 2017-01-14 ENCOUNTER — Encounter: Payer: Self-pay | Admitting: Internal Medicine

## 2017-02-11 ENCOUNTER — Telehealth: Payer: Self-pay | Admitting: Emergency Medicine

## 2017-02-11 ENCOUNTER — Other Ambulatory Visit: Payer: Self-pay | Admitting: Internal Medicine

## 2017-02-11 NOTE — Telephone Encounter (Signed)
Sent in a 90 day supply of Verapamil. Could you assist pt with making an appointment to be seen.

## 2017-02-14 NOTE — Telephone Encounter (Signed)
Pt will callback to sch needs to keep calender
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
## 2017-08-24 NOTE — Progress Notes (Signed)
Chief Complaint  Patient presents with  . medication refills    HPI: Brooke Le 51 y.o. come in for SDA  Because she is running out of her blood pressure medicine and needs a follow-up visit.  Last ov pv 2017 she had hypertension and anemia and didn't able to follow-up as planned. She did visit the emergency room June 30 for chest pain that she said ended up being a panic attack and has had no residual since then. They did tell her to take aspirin and atorvastatin. And discussed having a stress test evaluation as an outpatient. She knows it was stressed has had no recurrence no change in exercise tolerance. Ran out of blood pressure medicine about 2 weeks ago her blood pressure was in the 120/80 range at that time. She's trying to take iron no excess bleeding but is having a regular skipped periods longus. Was about 7-10 days. Wonders if she is in menopause. No unusual bleeding elsewhere.Lots of stress at work and home but she is coping with it 50+ hours a week works for the [employer].  ROS: See pertinent positives and negatives per HPI.no unusual bleeding syncopeNo more panic think she is managing.has had some right heel tenderness or soreness at times no injury.  Past Medical History:  Diagnosis Date  . Allergic rhinitis   . Anemia, iron deficiency   . Fibromyalgia   . Gynecological examination    Dr. Nori Riis  . EXHBZJIR(678.9) 08/21/2007   Qualifier: Diagnosis of  By: Regis Bill MD, Standley Brooking   . Hypertension    had decreased with weight loss  . Low vitamin B12 level   . Migraine headache    hx of seen in [facility] in the remote past  . Obesity   . 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 Syncope    ER evaluation in 2008; had anemia then    Family History  Problem Relation Age of Onset  . Alcohol abuse Other   . Diabetes Other   . Hypertension Other     Social History   Social History  . Marital status:  Married    Spouse name: N/A  . Number of children: N/A  . Years of education: N/A   Social History Main Topics  . Smoking status: Never Smoker  . Smokeless tobacco: Never Used  . Alcohol use None  . Drug use: Unknown  . Sexual activity: Not Asked   Other Topics Concern  . None   Social History Narrative   Married   Regular Exercise- no   Laid off [employer]- July 2011 lost insurance in [place]   Back 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    No pets                 Outpatient Medications Prior to Visit  Medication Sig Dispense Refill  . amitriptyline (ELAVIL) 10 MG tablet Take 3 tablets (30 mg total) by mouth at bedtime. (Patient taking differently: Take 10-20 mg by mouth at bedtime. ) 90 tablet 3  . METHOCARBAMOL PO Take by mouth.    . verapamil (CALAN-SR) 180 MG CR tablet Take 1 tablet (180 mg total) by mouth at bedtime. 90 tablet 3  . verapamil (CALAN-SR) 120 MG CR tablet TAKE 1 TABLET (120 MG TOTAL) BY MOUTH DAILY. (Patient not taking: Reported on  08/25/2017) 90 tablet 0   No facility-administered medications prior to visit.      EXAM:  BP (!) 142/98 (BP Location: Right Arm, Patient Position: Sitting, Cuff Size: Normal)   Pulse 64   Temp 98.1 F (36.7 C) (Oral)   Wt 267 lb 3.2 oz (121.2 kg)   BMI 40.04 kg/m   Body mass index is 40.04 kg/m.  GENERAL: vitals reviewed and listed above, alert, oriented, appears well hydrated and in no acute distress HEENT: atraumatic, conjunctiva  clear, no obvious abnormalities on inspection of external nose and ears  NECK: no obvious masses on inspection palpation  LUNGS: clear to auscultation bilaterally, no wheezes, rales or rhonchi, good air movement CV: HRRR, no clubbing cyanosis or  peripheral edema nl cap refill  MS: moves all extremities without noticeable focal  abnormalityright heel appears normal minimal point tenderness of plantar fascial insertion. PSYCH:  pleasant and cooperative, no obvious depression or anxiety Lab Results  Component Value Date   WBC 5.3 09/01/2016   HGB 10.1 (L) 09/01/2016   HCT 30.5 (L) 09/01/2016   PLT 267.0 09/01/2016   GLUCOSE 83 09/01/2016   CHOL 158 09/01/2016   TRIG 56.0 09/01/2016   HDL 52.70 09/01/2016   LDLCALC 94 09/01/2016   ALT 10 09/01/2016   AST 13 09/01/2016   NA 138 09/01/2016   K 4.3 09/01/2016   CL 105 09/01/2016   CREATININE 0.93 09/01/2016   BUN 13 09/01/2016   CO2 28 09/01/2016   TSH 1.50 09/01/2016   HGBA1C 6.0 12/09/2010   BP Readings from Last 3 Encounters:  08/25/17 (!) 142/98  09/07/16 (!) 150/80  08/23/16 (!) 144/78  reviewed lab work done at emergency room no vomit. Hemoglobin was 9.8 creatinine about 1.  ASSESSMENT AND PLAN:  Discussed the following assessment and plan:  Essential hypertension - ran out of medicine she states was better controlled before ran out rules discussed contact us if not at 120/80 range refill medication - Plan: Lipid panel, Vitamin B12, Comprehensive metabolic panel, Lipid panel, Vitamin B12, Comprehensive metabolic panel  Medication management - Plan: Lipid panel, Vitamin B12, Comprehensive metabolic panel, Lipid panel, Vitamin B12, Comprehensive metabolic panel  Iron deficiency anemia, unspecified iron deficiency anemia type - Plan: Lipid panel, Vitamin B12, Comprehensive metabolic panel, Lipid panel, Vitamin B12, Comprehensive metabolic panel  Perimenopausal - Plan: Lipid panel, Vitamin B12, Comprehensive metabolic panel, Lipid panel, Vitamin B12, Comprehensive metabolic panel  Anemia, unspecified type - most likely iron deficiency but high risk for vitamin B12 deficiency because of her bariatric surgery may need to take more supplement - Plan: Lipid panel, Vitamin B12, Comprehensive metabolic panel, CBC with Differential/Platelet, Ferritin, VITAMIN D 25 Hydroxy (Vit-D Deficiency, Fractures), Lipid panel, Vitamin B12, Comprehensive metabolic  panel  PSTPRC STATUS, BARIATRIC SURGERY - Plan: Lipid panel, Vitamin B12, Comprehensive metabolic panel, Lipid panel, Vitamin B12, Comprehensive metabolic panel  Bariatric surgery status - Plan: VITAMIN D 25 Hydroxy (Vit-D Deficiency, Fractures)  Alkaline phosphatase raised - Plan: VITAMIN D 25 Hydroxy (Vit-D Deficiency, Fractures), Alkaline phosphatase  Stress Plan lab work today follow-up depending checkup CPX after October Let us know blood pressure not controlled.larm symptoms discussed for perimenopausal bleeding that is abnormal. No need for hormone levels based on her history exam. -Patient advised to return or notify health care team  if  new concerns arise.  Patient Instructions  Get  Back on  BP medication   And goal is 120/80  For heart health.   Will refill medication  Checking    Blood tests today  All  Previous  Labs and those ordered today .   If you get any chest pain decreased exercise tolerance she cannot explain contact us for further evaluation. Otherwise plan follow-up depending on lab work and time for checkup.  I think you're a normal menopause. However V if you are having prolonged bleeding lasting more than 7-10 days irregular bleeding contact us and advise GYN evaluation   Perimenopause Perimenopause is the time when your body begins to move into the menopause (no menstrual period for 12 straight months). It is a natural process. Perimenopause can begin 2-8 years before the menopause and usually lasts for 1 year after the menopause. During this time, your ovaries may or may not produce an egg. The ovaries vary in their production of estrogen and progesterone hormones each month. This can cause irregular menstrual periods, difficulty getting pregnant, vaginal bleeding between periods, and uncomfortable symptoms. What are the causes?  Irregular production of the ovarian hormones, estrogen and progesterone, and not ovulating every month. Other causes  include:  Tumor of the pituitary gland in the brain.  Medical disease that affects the ovaries.  Radiation treatment.  Chemotherapy.  Unknown causes.  Heavy smoking and excessive alcohol intake can bring on perimenopause sooner.  What are the signs or symptoms?  Hot flashes.  Night sweats.  Irregular menstrual periods.  Decreased sex drive.  Vaginal dryness.  Headaches.  Mood swings.  Depression.  Memory problems.  Irritability.  Tiredness.  Weight gain.  Trouble getting pregnant.  The beginning of losing bone cells (osteoporosis).  The beginning of hardening of the arteries (atherosclerosis). How is this diagnosed? Your health care provider will make a diagnosis by analyzing your age, menstrual history, and symptoms. He or she will do a physical exam and note any changes in your body, especially your female organs. Female hormone tests may or may not be helpful depending on the amount of female hormones you produce and when you produce them. However, other hormone tests may be helpful to rule out other problems. How is this treated? In some cases, no treatment is needed. The decision on whether treatment is necessary during the perimenopause should be made by you and your health care provider based on how the symptoms are affecting you and your lifestyle. Various treatments are available, such as:  Treating individual symptoms with a specific medicine for that symptom.  Herbal medicines that can help specific symptoms.  Counseling.  Group therapy.  Follow these instructions at home:  Keep track of your menstrual periods (when they occur, how heavy they are, how long between periods, and how long they last) as well as your symptoms and when they started.  Only take over-the-counter or prescription medicines as directed by your health care provider.  Sleep and rest.  Exercise.  Eat a diet that contains calcium (good for your bones) and soy (acts like  the estrogen hormone).  Do not smoke.  Avoid alcoholic beverages.  Take vitamin supplements as recommended by your health care provider. Taking vitamin E may help in certain cases.  Take calcium and vitamin D supplements to help prevent bone loss.  Group therapy is sometimes helpful.  Acupuncture may help in some cases. Contact a health care provider if:  You have questions about any symptoms you are having.  You need a referral to a specialist (gynecologist, psychiatrist, or psychologist). Get help right away if:  You have vaginal bleeding.  Your period lasts  longer than 8 days.  Your periods are recurring sooner than 21 days.  You have bleeding after intercourse.  You have severe depression.  You have pain when you urinate.  You have severe headaches.  You have vision problems. This information is not intended to replace advice given to you by your health care provider. Make sure you discuss any questions you have with your health care provider. Document Released: 01/06/2005 Document Revised: 05/06/2016 Document Reviewed: 06/28/2013 Elsevier Interactive Patient Education  2017 [place] K. Suesan Mohrmann M.D.

## 2017-08-25 ENCOUNTER — Encounter: Payer: Self-pay | Admitting: Internal Medicine

## 2017-08-25 ENCOUNTER — Ambulatory Visit (INDEPENDENT_AMBULATORY_CARE_PROVIDER_SITE_OTHER): Payer: BLUE CROSS/BLUE SHIELD | Admitting: Internal Medicine

## 2017-08-25 VITALS — BP 142/98 | HR 64 | Temp 98.1°F | Wt 267.2 lb

## 2017-08-25 DIAGNOSIS — Z9884 Bariatric surgery status: Secondary | ICD-10-CM | POA: Diagnosis not present

## 2017-08-25 DIAGNOSIS — I10 Essential (primary) hypertension: Secondary | ICD-10-CM | POA: Diagnosis not present

## 2017-08-25 DIAGNOSIS — N951 Menopausal and female climacteric states: Secondary | ICD-10-CM | POA: Diagnosis not present

## 2017-08-25 DIAGNOSIS — R748 Abnormal levels of other serum enzymes: Secondary | ICD-10-CM

## 2017-08-25 DIAGNOSIS — D649 Anemia, unspecified: Secondary | ICD-10-CM | POA: Diagnosis not present

## 2017-08-25 DIAGNOSIS — Z79899 Other long term (current) drug therapy: Secondary | ICD-10-CM

## 2017-08-25 DIAGNOSIS — D509 Iron deficiency anemia, unspecified: Secondary | ICD-10-CM

## 2017-08-25 DIAGNOSIS — F439 Reaction to severe stress, unspecified: Secondary | ICD-10-CM

## 2017-08-25 LAB — COMPREHENSIVE METABOLIC PANEL
ALK PHOS: 130 U/L — AB (ref 39–117)
ALT: 10 U/L (ref 0–35)
AST: 13 U/L (ref 0–37)
Albumin: 3.6 g/dL (ref 3.5–5.2)
BUN: 11 mg/dL (ref 6–23)
CHLORIDE: 105 meq/L (ref 96–112)
CO2: 31 mEq/L (ref 19–32)
Calcium: 9 mg/dL (ref 8.4–10.5)
Creatinine, Ser: 1.03 mg/dL (ref 0.40–1.20)
GFR: 72.52 mL/min (ref >60.00)
GLUCOSE: 99 mg/dL (ref 70–99)
POTASSIUM: 4.2 meq/L (ref 3.5–5.1)
SODIUM: 140 meq/L (ref 135–145)
TOTAL PROTEIN: 6.5 g/dL (ref 6.0–8.3)
Total Bilirubin: 0.8 mg/dL (ref 0.2–1.2)

## 2017-08-25 LAB — VITAMIN D 25 HYDROXY (VIT D DEFICIENCY, FRACTURES): VITD: 15.85 ng/mL — ABNORMAL LOW (ref 30.00–100.00)

## 2017-08-25 LAB — LIPID PANEL
CHOL/HDL RATIO: 3
Cholesterol: 168 mg/dL (ref 0–200)
HDL: 56.6 mg/dL (ref >39.00)
LDL Cholesterol: 100 mg/dL — ABNORMAL HIGH (ref 0–99)
NONHDL: 111.83
Triglycerides: 58 mg/dL (ref 0.0–149.0)
VLDL: 11.6 mg/dL (ref 0.0–40.0)

## 2017-08-25 LAB — CBC WITH DIFFERENTIAL/PLATELET
BASOS PCT: 0.7 % (ref 0.0–3.0)
Basophils Absolute: 0 10*3/uL (ref 0.0–0.1)
EOS PCT: 4.2 % (ref 0.0–5.0)
Eosinophils Absolute: 0.2 10*3/uL (ref 0.0–0.7)
HCT: 33.1 % — ABNORMAL LOW (ref 36.0–46.0)
Hemoglobin: 10.6 g/dL — ABNORMAL LOW (ref 12.0–15.0)
LYMPHS ABS: 2 10*3/uL (ref 0.7–4.0)
Lymphocytes Relative: 47.5 % — ABNORMAL HIGH (ref 12.0–46.0)
MCHC: 32 g/dL (ref 30.0–36.0)
MCV: 83.5 fl (ref 78.0–100.0)
MONO ABS: 0.3 10*3/uL (ref 0.1–1.0)
MONOS PCT: 6.5 % (ref 3.0–12.0)
NEUTROS ABS: 1.8 10*3/uL (ref 1.4–7.7)
NEUTROS PCT: 41.1 % — AB (ref 43.0–77.0)
PLATELETS: 257 10*3/uL (ref 150.0–400.0)
RBC: 3.96 Mil/uL (ref 3.87–5.11)
RDW: 15.7 % — AB (ref 11.5–15.5)
WBC: 4.3 10*3/uL (ref 4.0–10.5)

## 2017-08-25 LAB — ALKALINE PHOSPHATASE: Alkaline Phosphatase: 130 U/L — ABNORMAL HIGH (ref 39–117)

## 2017-08-25 LAB — VITAMIN B12: VITAMIN B 12: 136 pg/mL — AB (ref 211–911)

## 2017-08-25 LAB — FERRITIN: Ferritin: 6.1 ng/mL — ABNORMAL LOW (ref 10.0–291.0)

## 2017-08-25 MED ORDER — VERAPAMIL HCL ER 180 MG PO TBCR: 180.0000 mg | EXTENDED_RELEASE_TABLET | Freq: Every day | ORAL | 3 refills | Status: DC

## 2017-08-25 NOTE — Patient Instructions (Addendum)
Get  Back on  BP medication   And goal is 120/80  For heart health.   Will refill medication   Checking    Blood tests today  All  Previous  Labs and those ordered today .   If you get any chest pain decreased exercise tolerance she cannot explain contact us for further evaluation. Otherwise plan follow-up depending on lab work and time for checkup.  I think you're a normal menopause. However V if you are having prolonged bleeding lasting more than 7-10 days irregular bleeding contact us and advise GYN evaluation   Perimenopause Perimenopause is the time when your body begins to move into the menopause (no menstrual period for 12 straight months). It is a natural process. Perimenopause can begin 2-8 years before the menopause and usually lasts for 1 year after the menopause. During this time, your ovaries may or may not produce an egg. The ovaries vary in their production of estrogen and progesterone hormones each month. This can cause irregular menstrual periods, difficulty getting pregnant, vaginal bleeding between periods, and uncomfortable symptoms. What are the causes?  Irregular production of the ovarian hormones, estrogen and progesterone, and not ovulating every month. Other causes include:  Tumor of the pituitary gland in the brain.  Medical disease that affects the ovaries.  Radiation treatment.  Chemotherapy.  Unknown causes.  Heavy smoking and excessive alcohol intake can bring on perimenopause sooner.  What are the signs or symptoms?  Hot flashes.  Night sweats.  Irregular menstrual periods.  Decreased sex drive.  Vaginal dryness.  Headaches.  Mood swings.  Depression.  Memory problems.  Irritability.  Tiredness.  Weight gain.  Trouble getting pregnant.  The beginning of losing bone cells (osteoporosis).  The beginning of hardening of the arteries (atherosclerosis). How is this diagnosed? Your health care provider will make a diagnosis by  analyzing your age, menstrual history, and symptoms. He or she will do a physical exam and note any changes in your body, especially your female organs. Female hormone tests may or may not be helpful depending on the amount of female hormones you produce and when you produce them. However, other hormone tests may be helpful to rule out other problems. How is this treated? In some cases, no treatment is needed. The decision on whether treatment is necessary during the perimenopause should be made by you and your health care provider based on how the symptoms are affecting you and your lifestyle. Various treatments are available, such as:  Treating individual symptoms with a specific medicine for that symptom.  Herbal medicines that can help specific symptoms.  Counseling.  Group therapy.  Follow these instructions at home:  Keep track of your menstrual periods (when they occur, how heavy they are, how long between periods, and how long they last) as well as your symptoms and when they started.  Only take over-the-counter or prescription medicines as directed by your health care provider.  Sleep and rest.  Exercise.  Eat a diet that contains calcium (good for your bones) and soy (acts like the estrogen hormone).  Do not smoke.  Avoid alcoholic beverages.  Take vitamin supplements as recommended by your health care provider. Taking vitamin E may help in certain cases.  Take calcium and vitamin D supplements to help prevent bone loss.  Group therapy is sometimes helpful.  Acupuncture may help in some cases. Contact a health care provider if:  You have questions about any symptoms you are having.  You need  a referral to a specialist (gynecologist, psychiatrist, or psychologist). Get help right away if:  You have vaginal bleeding.  Your period lasts longer than 8 days.  Your periods are recurring sooner than 21 days.  You have bleeding after intercourse.  You have severe  depression.  You have pain when you urinate.  You have severe headaches.  You have vision problems. This information is not intended to replace advice given to you by your health care provider. Make sure you discuss any questions you have with your health care provider. Document Released: 01/06/2005 Document Revised: 05/06/2016 Document Reviewed: 06/28/2013 Elsevier Interactive Patient Education  2017 [employer].

## 2017-08-30 ENCOUNTER — Other Ambulatory Visit: Payer: Self-pay | Admitting: Emergency Medicine

## 2017-08-30 ENCOUNTER — Telehealth: Payer: Self-pay | Admitting: Internal Medicine

## 2017-08-30 MED ORDER — AMITRIPTYLINE HCL 10 MG PO TABS: 30.0000 mg | ORAL_TABLET | Freq: Every day | ORAL | 1 refills | Status: DC

## 2017-08-30 NOTE — Telephone Encounter (Signed)
Patient is requesting a refill of her Elvail 10mg  rx.  Last filled 10/24/15, #90 x 3 refills Last seen 08/25/17 for Annual  [facility], [place]  Please advise Dr Regis Bill if okay to refill, thanks.

## 2017-08-30 NOTE — Telephone Encounter (Signed)
Ok to refill for 6 months 

## 2017-08-30 NOTE — Telephone Encounter (Signed)
Medication has been sent to the pharmacy.  Nothing further needed. 

## 2017-09-02 ENCOUNTER — Encounter: Payer: Self-pay | Admitting: Internal Medicine

## 2017-09-27 LAB — HM MAMMOGRAPHY

## 2017-09-27 NOTE — Progress Notes (Deleted)
No chief complaint on file.   HPI: Patient  Brooke Le  51 y.o. comes in today for Preventive Health Care visit   Fu ht control  Anemia  elevated alk phos? w low vit d   Low vit b12   Health Maintenance  Topic Date Due  . MAMMOGRAM  03/06/2016  . INFLUENZA VACCINE  03/12/2018 (Originally 07/13/2017)  . COLONOSCOPY  08/25/2018 (Originally 03/06/2016)  . TETANUS/TDAP  08/21/2018  . PAP SMEAR  09/08/2019  . HIV Screening  Completed   Health Maintenance Review LIFESTYLE:  Exercise:   Tobacco/ETS: Alcohol:  Sugar beverages: Sleep: Drug use: no HH of  Work:    ROS:  GEN/ HEENT: No fever, significant weight changes sweats headaches vision problems hearing changes, CV/ PULM; No chest pain shortness of breath cough, syncope,edema  change in exercise tolerance. GI /GU: No adominal pain, vomiting, change in bowel habits. No blood in the stool. No significant GU symptoms. SKIN/HEME: ,no acute skin rashes suspicious lesions or bleeding. No lymphadenopathy, nodules, masses.  NEURO/ PSYCH:  No neurologic signs such as weakness numbness. No depression anxiety. IMM/ Allergy: No unusual infections.  Allergy .   REST of 12 system review negative except as per HPI   Past Medical History:  Diagnosis Date  . Allergic rhinitis   . Anemia, iron deficiency   . Fibromyalgia   . Gynecological examination    Dr. Nori Riis  . XNATFTDD(220.2) 08/21/2007   Qualifier: Diagnosis of  By: Regis Bill MD, Standley Brooking   . Hypertension    had decreased with weight loss  . Low vitamin B12 level   . Migraine headache    hx of seen in [facility] in the remote past  . Obesity   . 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 Syncope    ER evaluation in 2008; had anemia then    Past Surgical History:  Procedure Laterality Date  . ADENOIDECTOMY    . ESOPHAGOGASTRODUODENOSCOPY  10/31/2002  . GASTRIC BYPASS    . TUBAL LIGATION      Family  History  Problem Relation Age of Onset  . Alcohol abuse Other   . Diabetes Other   . Hypertension Other     Social History   Social History  . Marital status: Married    Spouse name: N/A  . Number of children: N/A  . Years of education: N/A   Social History Main Topics  . Smoking status: Never Smoker  . Smokeless tobacco: Never Used  . Alcohol use Not on file  . Drug use: Unknown  . Sexual activity: Not on file   Other Topics Concern  . 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 amitriptyline (ELAVIL) 10 MG tablet Take 3 tablets (30 mg total) by mouth at bedtime. 90 tablet 1  . atorvastatin (LIPITOR) 40 MG tablet Take 1 tablet by mouth daily.    [name] METHOCARBAMOL PO Take by mouth.    . verapamil (CALAN-SR) 180 MG  CR tablet Take 1 tablet (180 mg total) by mouth at bedtime. 90 tablet 3   No facility-administered medications prior to visit.      EXAM:  There were no vitals taken for this visit.  There is no height or weight on file to calculate BMI. Wt Readings from Last 3 Encounters:  08/25/17 267 lb 3.2 oz (121.2 kg)  09/07/16 266 lb (120.7 kg)  08/23/16 266 lb 9.6 oz (120.9 kg)    Physical Exam: Vital signs reviewed GYF:VCBS is a well-developed well-nourished alert cooperative    who appearsr stated age in no acute distress.  HEENT: normocephalic atraumatic , Eyes: PERRL EOM's full, conjunctiva clear, Nares: paten,t no deformity discharge or tenderness., Ears: no deformity EAC's clear TMs with normal landmarks. Mouth: clear OP, no lesions, edema.  Moist mucous membranes. Dentition in adequate repair. NECK: supple without masses, thyromegaly or bruits. CHEST/PULM:  Clear  to auscultation and percussion breath sounds equal no wheeze , rales or rhonchi. No chest wall deformities or tenderness. Breast: normal by inspection . No dimpling, discharge, masses, tenderness or discharge . CV: PMI is nondisplaced, S1 S2 no gallops, murmurs, rubs. Peripheral pulses are full without delay.No JVD .  ABDOMEN: Bowel sounds normal nontender  No guard or rebound, no hepato splenomegal no CVA tenderness.  No hernia. Extremtities:  No clubbing cyanosis or edema, no acute joint swelling or redness no focal atrophy NEURO:  Oriented x3, cranial nerves 3-12 appear to be intact, no obvious focal weakness,gait within normal limits no abnormal reflexes or asymmetrical SKIN: No acute rashes normal turgor, color, no bruising or petechiae. PSYCH: Oriented, good eye contact, no obvious depression anxiety, cognition and judgment appear normal. LN: no cervical axillary inguinal adenopathy  Lab Results  Component Value Date   WBC 4.3 08/25/2017   HGB 10.6 (L) 08/25/2017   HCT 33.1 (L) 08/25/2017   PLT 257.0 08/25/2017   GLUCOSE 99 08/25/2017   CHOL 168 08/25/2017   TRIG 58.0 08/25/2017   HDL 56.60 08/25/2017   LDLCALC 100 (H) 08/25/2017   ALT 10 08/25/2017   AST 13 08/25/2017   NA 140 08/25/2017   K 4.2 08/25/2017   CL 105 08/25/2017   CREATININE 1.03 08/25/2017   BUN 11 08/25/2017   CO2 31 08/25/2017   TSH 1.50 09/01/2016   HGBA1C 6.0 12/09/2010    BP Readings from Last 3 Encounters:  08/25/17 (!) 142/98  09/07/16 (!) 150/80  08/23/16 (!) 144/78    Lab results reviewed with patient   ASSESSMENT AND PLAN:  Discussed the following assessment and plan:  Visit for preventive health examination  Essential hypertension  Medication management  Iron deficiency anemia, unspecified iron deficiency anemia type  S/P bariatric surgery  Low vitamin B12 level  Vitamin D deficiency  Patient Care Team: Burnis Medin, MD as PCP - General There are no Patient Instructions  on file for this visit.  Standley Brooking. Darryn Kydd M.D.

## 2017-09-28 ENCOUNTER — Encounter: Payer: BLUE CROSS/BLUE SHIELD | Admitting: Internal Medicine

## 2017-10-11 NOTE — Progress Notes (Signed)
Chief Complaint  Patient presents with  . Annual Exam    Pt not fasting. No new concerns    HPI: Patient  Brooke Le  51 y.o. comes in today for Preventive Health Care visit follow-up of labs and medical problems.  bp 140 /80 and on 180 verapamil. Headaches are okay she is on amitriptyline at night or actually the day to help her sleep.  She works nights and sleeps during the day. Taking iron about every other 2-3 days. Has not been taking B12 but is taking a woman's vitamin with iron and B in it. She never had official contact about colonoscopy with her family history of colon cancer but she is not sure they left a message in her schedule is a regular.. She is not taking the Lipitor see last visit. She would like to see a nutritionist has been hard to get her weight under right control and some difficulties because of her schedule.  Every other  2-3 days.   Health Maintenance  Topic Date Due  . INFLUENZA VACCINE  03/12/2018 (Originally 07/13/2017)  . COLONOSCOPY  08/25/2018 (Originally 03/06/2016)  . TETANUS/TDAP  08/21/2018  . PAP SMEAR  09/08/2019  . MAMMOGRAM  09/29/2019  . HIV Screening  Completed   Health Maintenance Review LIFESTYLE:  Exercise:  Low end.  Tobacco/ETS: no Alcohol:  ocass  Out   Sugar beverages: interested to go to nutritionist.   Coke at night    Sleep: at least 6 hours    Some help.  Drug use: no HH of   5   Daughter and aunt .   No pets.  Work: 40 hours    10 pm to 830  4 days per week .   ROS:  GEN/ HEENT: No fever, significant weight changes sweats headaches vision problems hearing changes, CV/ PULM; No chest pain shortness of breath cough, syncope,edema  change in exercise tolerance. GI /GU: No adominal pain, vomiting, change in bowel habits. No blood in the stool. No significant GU symptoms. SKIN/HEME: ,no acute skin rashes suspicious lesions or bleeding. No lymphadenopathy, nodules, masses.  NEURO/ PSYCH:  No neurologic signs  such as weakness numbness. No depression anxiety. IMM/ Allergy: No unusual infections.  Allergy .   REST of 12 system review negative except as per HPI   Past Medical History:  Diagnosis Date  . Allergic rhinitis   . Anemia, iron deficiency   . Fibromyalgia   . Gynecological examination    Dr. Nori Riis  . SWNIOEVO(350.0) 08/21/2007   Qualifier: Diagnosis of  By: Regis Bill MD, Standley Brooking   . Hypertension    had decreased with weight loss  . Low vitamin B12 level   . Migraine headache    hx of seen in [facility] center in the remote past  . Obesity   . 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 Syncope    ER evaluation in 2008; had anemia then    Past Surgical History:  Procedure Laterality Date  . ADENOIDECTOMY    . ESOPHAGOGASTRODUODENOSCOPY  10/31/2002  . GASTRIC BYPASS    . TUBAL LIGATION      Family History  Problem Relation Age of Onset  . Alcohol abuse Other   . Diabetes Other   . Hypertension Other     Social History   Social History  . Marital status: Married    Spouse name: N/A  . Number of  children: N/A  . Years of education: N/A   Social History Main Topics  . Smoking status: Never Smoker  . Smokeless tobacco: Never Used  . Alcohol use None  . Drug use: Unknown  . Sexual activity: Not Asked   Other Topics Concern  . None   Social History Narrative   Married   Regular Exercise- no   Laid off [employer]- July 2011 lost insurance in [place]   Back 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    No pets                 Outpatient Medications Prior to Visit  Medication Sig Dispense Refill  . amitriptyline (ELAVIL) 10 MG tablet Take 3 tablets (30 mg total) by mouth at bedtime. 90 tablet 1  . METHOCARBAMOL PO Take by mouth.    [name] atorvastatin (LIPITOR) 40 MG tablet Take 1 tablet by mouth daily.    . verapamil (CALAN-SR) 180 MG CR tablet Take 1 tablet (180  mg total) by mouth at bedtime. 90 tablet 3   No facility-administered medications prior to visit.      EXAM:  BP (!) 150/88 (BP Location: Right Arm, Patient Position: Sitting, Cuff Size: Normal)   Pulse 66   Temp 98.3 F (36.8 C) (Oral)   Ht 5' 7.75" (1.721 m)   Wt 272 lb 4.8 oz (123.5 kg)   BMI 41.71 kg/m   Body mass index is 41.71 kg/m. Wt Readings from Last 3 Encounters:  10/12/17 272 lb 4.8 oz (123.5 kg)  08/25/17 267 lb 3.2 oz (121.2 kg)  09/07/16 266 lb (120.7 kg)    Physical Exam: Vital signs reviewed ENI:DPOE is a well-developed well-nourished alert cooperative    who appearsr stated age in no acute distress.  HEENT: normocephalic atraumatic , Eyes: PERRL EOM's full, conjunctiva clear, Nares: paten,t no deformity discharge or tenderness., Ears: no deformity EAC's clear TMs with normal landmarks. Mouth: clear OP, no lesions, edema.  Moist mucous membranes. Dentition in adequate repair. NECK: supple without masses, or bruits.  Thyroid is easily palpable CHEST/PULM:  Clear to auscultation and percussion breath sounds equal no wheeze , rales or rhonchi. No chest wall deformities or tenderness. Breast: normal by inspection . No dimpling, discharge, masses, tenderness or discharge . CV: PMI is nondisplaced, S1 S2 no gallops, murmurs, rubs. Peripheral pulses are full without delay.No JVD .  ABDOMEN: Bowel sounds normal nontender  No guard or rebound, no hepato splenomegal no CVA tenderness.   Extremtities:  No clubbing cyanosis or edema, no acute joint swelling or redness no focal atrophy NEURO:  Oriented x3, cranial nerves 3-12 appear to be intact, no obvious focal weakness,gait within normal limits no abnormal reflexes or asymmetrical SKIN: No acute rashes normal turgor, color, no bruising or petechiae. PSYCH: Oriented, good eye contact, no obvious depression anxiety, cognition and judgment appear normal. LN: no cervical axillary l adenopathy  Lab Results  Component Value  Date   WBC 4.3 08/25/2017   HGB 10.6 (L) 08/25/2017   HCT 33.1 (L) 08/25/2017   PLT 257.0 08/25/2017   GLUCOSE 99 08/25/2017   CHOL 168 08/25/2017   TRIG 58.0 08/25/2017   HDL 56.60 08/25/2017   LDLCALC 100 (H) 08/25/2017   ALT 10 08/25/2017   AST 13 08/25/2017   NA 140 08/25/2017   K 4.2 08/25/2017   CL 105 08/25/2017   CREATININE 1.03 08/25/2017  BUN 11 08/25/2017   CO2 31 08/25/2017   TSH 1.50 09/01/2016   HGBA1C 6.0 12/09/2010    BP Readings from Last 3 Encounters:  10/12/17 (!) 150/88  08/25/17 (!) 142/98  09/07/16 (!) 150/80   Wt Readings from Last 3 Encounters:  10/12/17 272 lb 4.8 oz (123.5 kg)  08/25/17 267 lb 3.2 oz (121.2 kg)  09/07/16 266 lb (120.7 kg)   Lab Results  Component Value Date   VITAMINB12 136 (L) 08/25/2017    Lab results reviewed with patient   ASSESSMENT AND PLAN:  Discussed the following assessment and plan:  Visit for preventive health examination  Essential hypertension - Not controlled increase verapamil other options planned - Plan: Amb ref to Medical Nutrition Therapy-MNT  Medication management  Iron deficiency anemia, unspecified iron deficiency anemia type - Plan: Vitamin B12, CBC with Differential/Platelet, Amb ref to Medical Nutrition Therapy-MNT  PSTPRC STATUS, BARIATRIC SURGERY - Plan: Vitamin B12, CBC with Differential/Platelet, Amb ref to Medical Nutrition Therapy-MNT  Anemia, unspecified type - Improved continue iron and injection B12 add oral B12 - Plan: Vitamin B12, CBC with Differential/Platelet  Perimenopausal  Low vitamin B12 level - Injection today options given oral B12 daily recheck in 2-3 months previsit. - Plan: Vitamin B12, CBC with Differential/Platelet, cyanocobalamin ((VITAMIN B-12)) injection 1,000 mcg, Amb ref to Medical Nutrition Therapy-MNT  Alkaline phosphatase raised  Colon cancer screening - Plan: Ambulatory referral to Gastroenterology  Family history of colon cancer - Plan: Ambulatory  referral to Gastroenterology  Morbid obesity (HCC) - Plan: Amb ref to Medical Nutrition Therapy-MNT Low vit d     Patient Care Team: Madelin Headings, MD as PCP - General Patient Instructions  Blood pressure is still up and not in range  increase to verapanil to 240   Will send in to phramcy .  Will dosa referral to nutrition.   About  Conditions .  And re refer to gi about colonscopy .   caffiene ok sugar is not. Continue   Iron  And  womesn vit d as possible  B12 injection today and  Take 1000  b12 per day as possible.  Ok to stay off the lipitor for now.   Try 3 weeks  NO processed carbs breads bages chips crackers etc etc  No calories from beveraged except low fat milk.   Lab  In    2-3 months and then ROV about BP and vit b12 etc  I will review your records for any other loose sounds follow-up needed.   Preventive Care 40-64 Years, Female Preventive care refers to lifestyle choices and visits with your health care provider that can promote health and wellness. What does preventive care include?  A yearly physical exam. This is also called an annual well check.  Dental exams once or twice a year.  Routine eye exams. Ask your health care provider how often you should have your eyes checked.  Personal lifestyle choices, including: ? Daily care of your teeth and gums. ? Regular physical activity. ? Eating a healthy diet. ? Avoiding tobacco and drug use. ? Limiting alcohol use. ? Practicing safe sex. ? Taking low-dose aspirin daily starting at age 18. ? Taking vitamin and mineral supplements as recommended by your health care provider. What happens during an annual well check? The services and screenings done by your health care provider during your annual well check will depend on your age, overall health, lifestyle risk factors, and family history of disease. Counseling Your health  care provider may ask you questions about your:  Alcohol use.  Tobacco use.  Drug  use.  Emotional well-being.  Home and relationship well-being.  Sexual activity.  Eating habits.  Work and work Statistician.  Method of birth control.  Menstrual cycle.  Pregnancy history.  Screening You may have the following tests or measurements:  Height, weight, and BMI.  Blood pressure.  Lipid and cholesterol levels. These may be checked every 5 years, or more frequently if you are over 33 years old.  Skin check.  Lung cancer screening. You may have this screening every year starting at age 65 if you have a 30-pack-year history of smoking and currently smoke or have quit within the past 15 years.  Fecal occult blood test (FOBT) of the stool. You may have this test every year starting at age 74.  Flexible sigmoidoscopy or colonoscopy. You may have a sigmoidoscopy every 5 years or a colonoscopy every 10 years starting at age 67.  Hepatitis C blood test.  Hepatitis B blood test.  Sexually transmitted disease (STD) testing.  Diabetes screening. This is done by checking your blood sugar (glucose) after you have not eaten for a while (fasting). You may have this done every 1-3 years.  Mammogram. This may be done every 1-2 years. Talk to your health care provider about when you should start having regular mammograms. This may depend on whether you have a family history of breast cancer.  BRCA-related cancer screening. This may be done if you have a family history of breast, ovarian, tubal, or peritoneal cancers.  Pelvic exam and Pap test. This may be done every 3 years starting at age 46. Starting at age 27, this may be done every 5 years if you have a Pap test in combination with an HPV test.  Bone density scan. This is done to screen for osteoporosis. You may have this scan if you are at high risk for osteoporosis.  Discuss your test results, treatment options, and if necessary, the need for more tests with your health care provider. Vaccines Your health care  provider may recommend certain vaccines, such as:  Influenza vaccine. This is recommended every year.  Tetanus, diphtheria, and acellular pertussis (Tdap, Td) vaccine. You may need a Td booster every 10 years.  Varicella vaccine. You may need this if you have not been vaccinated.  Zoster vaccine. You may need this after age 19.  Measles, mumps, and rubella (MMR) vaccine. You may need at least one dose of MMR if you were born in 1957 or later. You may also need a second dose.  Pneumococcal 13-valent conjugate (PCV13) vaccine. You may need this if you have certain conditions and were not previously vaccinated.  Pneumococcal polysaccharide (PPSV23) vaccine. You may need one or two doses if you smoke cigarettes or if you have certain conditions.  Meningococcal vaccine. You may need this if you have certain conditions.  Hepatitis A vaccine. You may need this if you have certain conditions or if you travel or work in places where you may be exposed to hepatitis A.  Hepatitis B vaccine. You may need this if you have certain conditions or if you travel or work in places where you may be exposed to hepatitis B.  Haemophilus influenzae type b (Hib) vaccine. You may need this if you have certain conditions.  Talk to your health care provider about which screenings and vaccines you need and how often you need them. This information is not intended to  replace advice given to you by your health care provider. Make sure you discuss any questions you have with your health care provider. Document Released: 12/26/2015 Document Revised: 08/18/2016 Document Reviewed: 09/30/2015 Elsevier Interactive Patient Education  2017 [place] K. Rollande Thursby M.D. After patient left reviewed record for other loose ends.  It appears she never got her abdominal ultrasound nor thyroid ultrasound for follow-up of abnormality seen on the chest CT in September 2016. We will plan to reorder and contact patient  about timing.  IMPRESSION: 1. No acute traumatic injury to the thorax. 2. Incidental findings include exophytic left thyroid nodule versus goiter. Recommend nonemergent thyroid ultrasound. There is a 1.7 cm subcapsular hypodense lesion in the right lobe of the liver. Correlation with any prior imaging recommended to evaluate for imaging stability. In the absence of prior imaging, initial evaluation could be considered with ultrasound.   Electronically Signed   By: Jeb Levering M.D.   On: 08/26/2015 07:02

## 2017-10-12 ENCOUNTER — Ambulatory Visit (INDEPENDENT_AMBULATORY_CARE_PROVIDER_SITE_OTHER): Payer: BLUE CROSS/BLUE SHIELD | Admitting: Internal Medicine

## 2017-10-12 ENCOUNTER — Encounter: Payer: Self-pay | Admitting: Internal Medicine

## 2017-10-12 VITALS — BP 150/88 | HR 66 | Temp 98.3°F | Ht 67.75 in | Wt 272.3 lb

## 2017-10-12 DIAGNOSIS — Z9884 Bariatric surgery status: Secondary | ICD-10-CM

## 2017-10-12 DIAGNOSIS — Z0000 Encounter for general adult medical examination without abnormal findings: Secondary | ICD-10-CM | POA: Diagnosis not present

## 2017-10-12 DIAGNOSIS — D509 Iron deficiency anemia, unspecified: Secondary | ICD-10-CM | POA: Diagnosis not present

## 2017-10-12 DIAGNOSIS — D649 Anemia, unspecified: Secondary | ICD-10-CM

## 2017-10-12 DIAGNOSIS — I10 Essential (primary) hypertension: Secondary | ICD-10-CM

## 2017-10-12 DIAGNOSIS — Z800 Family history of malignant neoplasm of digestive organs: Secondary | ICD-10-CM

## 2017-10-12 DIAGNOSIS — Z1211 Encounter for screening for malignant neoplasm of colon: Secondary | ICD-10-CM | POA: Diagnosis not present

## 2017-10-12 DIAGNOSIS — N951 Menopausal and female climacteric states: Secondary | ICD-10-CM

## 2017-10-12 DIAGNOSIS — R748 Abnormal levels of other serum enzymes: Secondary | ICD-10-CM

## 2017-10-12 DIAGNOSIS — Z79899 Other long term (current) drug therapy: Secondary | ICD-10-CM

## 2017-10-12 DIAGNOSIS — E538 Deficiency of other specified B group vitamins: Secondary | ICD-10-CM

## 2017-10-12 MED ORDER — CYANOCOBALAMIN 1000 MCG/ML IJ SOLN
1000.0000 ug | Freq: Once | INTRAMUSCULAR | Status: AC
  Administered 2017-10-12: 1000 ug via INTRAMUSCULAR

## 2017-10-12 MED ORDER — VERAPAMIL HCL ER 240 MG PO TBCR: 240.0000 mg | EXTENDED_RELEASE_TABLET | Freq: Every day | ORAL | 1 refills | Status: DC

## 2017-10-12 NOTE — Patient Instructions (Addendum)
Blood pressure is still up and not in range  increase to verapanil to 240   Will send in to phramcy .  Will dosa referral to nutrition.   About  Conditions .  And re refer to gi about colonscopy .   caffiene ok sugar is not. Continue   Iron  And  womesn vit d as possible  B12 injection today and  Take 1000  b12 per day as possible.  Ok to stay off the lipitor for now.   Try 3 weeks  NO processed carbs breads bages chips crackers etc etc  No calories from beveraged except low fat milk.   Lab  In    2-3 months and then ROV about BP and vit b12 etc  I will review your records for any other loose sounds follow-up needed.   Preventive Care 40-64 Years, Female Preventive care refers to lifestyle choices and visits with your health care provider that can promote health and wellness. What does preventive care include?  A yearly physical exam. This is also called an annual well check.  Dental exams once or twice a year.  Routine eye exams. Ask your health care provider how often you should have your eyes checked.  Personal lifestyle choices, including: ? Daily care of your teeth and gums. ? Regular physical activity. ? Eating a healthy diet. ? Avoiding tobacco and drug use. ? Limiting alcohol use. ? Practicing safe sex. ? Taking low-dose aspirin daily starting at age 25. ? Taking vitamin and mineral supplements as recommended by your health care provider. What happens during an annual well check? The services and screenings done by your health care provider during your annual well check will depend on your age, overall health, lifestyle risk factors, and family history of disease. Counseling Your health care provider may ask you questions about your:  Alcohol use.  Tobacco use.  Drug use.  Emotional well-being.  Home and relationship well-being.  Sexual activity.  Eating habits.  Work and work Statistician.  Method of birth control.  Menstrual cycle.  Pregnancy  history.  Screening You may have the following tests or measurements:  Height, weight, and BMI.  Blood pressure.  Lipid and cholesterol levels. These may be checked every 5 years, or more frequently if you are over 76 years old.  Skin check.  Lung cancer screening. You may have this screening every year starting at age 4 if you have a 30-pack-year history of smoking and currently smoke or have quit within the past 15 years.  Fecal occult blood test (FOBT) of the stool. You may have this test every year starting at age 3.  Flexible sigmoidoscopy or colonoscopy. You may have a sigmoidoscopy every 5 years or a colonoscopy every 10 years starting at age 44.  Hepatitis C blood test.  Hepatitis B blood test.  Sexually transmitted disease (STD) testing.  Diabetes screening. This is done by checking your blood sugar (glucose) after you have not eaten for a while (fasting). You may have this done every 1-3 years.  Mammogram. This may be done every 1-2 years. Talk to your health care provider about when you should start having regular mammograms. This may depend on whether you have a family history of breast cancer.  BRCA-related cancer screening. This may be done if you have a family history of breast, ovarian, tubal, or peritoneal cancers.  Pelvic exam and Pap test. This may be done every 3 years starting at age 44. Starting at age 32,  this may be done every 5 years if you have a Pap test in combination with an HPV test.  Bone density scan. This is done to screen for osteoporosis. You may have this scan if you are at high risk for osteoporosis.  Discuss your test results, treatment options, and if necessary, the need for more tests with your health care provider. Vaccines Your health care provider may recommend certain vaccines, such as:  Influenza vaccine. This is recommended every year.  Tetanus, diphtheria, and acellular pertussis (Tdap, Td) vaccine. You may need a Td booster  every 10 years.  Varicella vaccine. You may need this if you have not been vaccinated.  Zoster vaccine. You may need this after age 58.  Measles, mumps, and rubella (MMR) vaccine. You may need at least one dose of MMR if you were born in 1957 or later. You may also need a second dose.  Pneumococcal 13-valent conjugate (PCV13) vaccine. You may need this if you have certain conditions and were not previously vaccinated.  Pneumococcal polysaccharide (PPSV23) vaccine. You may need one or two doses if you smoke cigarettes or if you have certain conditions.  Meningococcal vaccine. You may need this if you have certain conditions.  Hepatitis A vaccine. You may need this if you have certain conditions or if you travel or work in places where you may be exposed to hepatitis A.  Hepatitis B vaccine. You may need this if you have certain conditions or if you travel or work in places where you may be exposed to hepatitis B.  Haemophilus influenzae type b (Hib) vaccine. You may need this if you have certain conditions.  Talk to your health care provider about which screenings and vaccines you need and how often you need them. This information is not intended to replace advice given to you by your health care provider. Make sure you discuss any questions you have with your health care provider. Document Released: 12/26/2015 Document Revised: 08/18/2016 Document Reviewed: 09/30/2015 Elsevier Interactive Patient Education  2017 [employer].

## 2017-10-13 ENCOUNTER — Encounter: Payer: Self-pay | Admitting: Gastroenterology

## 2017-10-13 ENCOUNTER — Telehealth: Payer: Self-pay | Admitting: *Deleted

## 2017-10-13 DIAGNOSIS — K769 Liver disease, unspecified: Secondary | ICD-10-CM

## 2017-10-13 DIAGNOSIS — E041 Nontoxic single thyroid nodule: Secondary | ICD-10-CM

## 2017-10-13 NOTE — Telephone Encounter (Signed)
-----   Message from Burnis Medin, MD sent at 10/12/2017  6:13 PM EDT ----- Brooke Le please tell patient I reviewed her chart she never got her thyroid ultrasound for that questionable nodule that was seen on the CT scan of her chest she was also supposed to get an abdominal ultrasound to check an area spot on her  liver both of these orders expired. If she agrees please order thyroid ultrasound and abdominal ultrasound for these reasons, thank you  wp

## 2017-10-18 NOTE — Telephone Encounter (Signed)
Orders placed for Thyroid and Abdominal Ultrasound - pt advised to contact our office if not heard from anyone within 2 weeks, so that this does not fall through the cracks again.  GI appt is scheduled for January 3rd with  -- FYI Dr Regis Bill.

## 2017-11-21 ENCOUNTER — Encounter: Payer: Self-pay | Admitting: Internal Medicine

## 2017-11-22 ENCOUNTER — Ambulatory Visit: Payer: BLUE CROSS/BLUE SHIELD | Admitting: Skilled Nursing Facility1

## 2017-12-02 ENCOUNTER — Encounter: Payer: Self-pay | Admitting: Internal Medicine

## 2017-12-15 ENCOUNTER — Encounter: Payer: BLUE CROSS/BLUE SHIELD | Admitting: Gastroenterology

## 2018-04-22 ENCOUNTER — Other Ambulatory Visit: Payer: Self-pay | Admitting: Internal Medicine

## 2018-04-26 NOTE — Progress Notes (Signed)
Chief Complaint  Patient presents with  . Medication Refill    Verapamil    HPI: Brooke Le 52 y.o. come in for Chronic disease management   Bp   Had screenint at work   A month or so ago .   bp 170    Or so   Over worked  Changes  Eating etc and     Losing weight  . Keto a  Long term goal 30 #  Loss  Anemia    No period   Due end of  April  And not since then   Heavy  At onset.  Taking mvi  Taking bp at night  Sleep the same  Nights work  Some activity  Needs reill med   ROS: See pertinent positives and negatives per HPI. No cp sob   Past Medical History:  Diagnosis Date  . Allergic rhinitis   . Anemia, iron deficiency   . Fibromyalgia   . Gynecological examination    Dr. Nori Riis  . [id number](323.5) 08/21/2007   Qualifier: Diagnosis of  By: Regis Bill MD, Standley Brooking   . Hypertension    had decreased with weight loss  . Low vitamin B12 level   . Migraine headache    hx of seen in [facility] in the remote past  . Obesity   . 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 Syncope    ER evaluation in 2008; had anemia then    Family History  Problem Relation Age of Onset  . Alcohol abuse Other   . Diabetes Other   . Hypertension Other     Social History   Socioeconomic History  . Marital status: Married    Spouse name: Not on file  . Number of children: Not on file  . Years of education: Not on file  . Highest education level: Not on file  Occupational History  . Not on file  Social Needs  . Financial resource strain: Not on file  . Food insecurity:    Worry: Not on file    Inability: Not on file  . Transportation needs:    Medical: Not on file    Non-medical: Not on file  Tobacco Use  . Smoking status: Never Smoker  . Smokeless tobacco: Never Used  Substance and Sexual Activity  . Alcohol use: Not on file  . Drug use: Not on file  . Sexual activity: Not on file  Lifestyle  . Physical activity:      Days per week: Not on file    Minutes per session: Not on file  . Stress: Not on file  Relationships  . Social connections:    Talks on phone: Not on file    Gets together: Not on file    Attends religious service: Not on file    Active member of club or organization: Not on file    Attends meetings of clubs or organizations: Not on file    Relationship status: Not on file  Other Topics Concern  . Not on file  Social History Narrative   Married   Regular Exercise- no   Laid off [employer]- July 2011 lost insurance in [place] 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    No pets  Outpatient Medications Prior to Visit  Medication Sig Dispense Refill  . amitriptyline (ELAVIL) 10 MG tablet TAKE 3 TABLETS (30 MG TOTAL) BY MOUTH AT BEDTIME. 90 tablet 0  . verapamil (CALAN-SR) 240 MG CR tablet Take 1 tablet (240 mg total) by mouth at bedtime. 90 tablet 1  . METHOCARBAMOL PO Take by mouth.     No facility-administered medications prior to visit.      EXAM:  BP 122/68 (BP Location: Right Arm, Patient Position: Sitting, Cuff Size: Normal)   Pulse 72   Temp 97.9 F (36.6 C) (Oral)   Wt 263 lb 11.2 oz (119.6 kg)   BMI 40.39 kg/m   Body mass index is 40.39 kg/m.  GENERAL: vitals reviewed and listed above, alert, oriented, appears well hydrated and in no acute distress HEENT: atraumatic, conjunctiva  clear, no obvious abnormalities on inspection of external nose and ears OP : no lesion edema or exudate  NECK: no obvious masses on inspection palpation  LUNGS: clear to auscultation bilaterally, no wheezes, rales or rhonchi, good air movement CV: HRRR, no clubbing cyanosis mini edema  peripheral edema nl cap refill  Abdomen:  Sof,t normal bowel sounds without hepatosplenomegaly, no guarding rebound or masses no CVA tenderness MS: moves all extremities without noticeable focal  abnormality PSYCH: pleasant  and cooperative, no obvious depression or anxiety Lab Results  Component Value Date   WBC 4.3 08/25/2017   HGB 10.6 (A) 04/27/2018   HCT 33.1 (L) 08/25/2017   PLT 257.0 08/25/2017   GLUCOSE 99 08/25/2017   CHOL 168 08/25/2017   TRIG 58.0 08/25/2017   HDL 56.60 08/25/2017   LDLCALC 100 (H) 08/25/2017   ALT 10 08/25/2017   AST 13 08/25/2017   NA 140 08/25/2017   K 4.2 08/25/2017   CL 105 08/25/2017   CREATININE 1.03 08/25/2017   BUN 11 08/25/2017   CO2 31 08/25/2017   TSH 1.50 09/01/2016   HGBA1C 6.0 12/09/2010   BP Readings from Last 3 Encounters:  04/27/18 122/68  10/12/17 (!) 150/88  08/25/17 (!) 142/98   Wt Readings from Last 3 Encounters:  04/27/18 263 lb 11.2 oz (119.6 kg)  10/12/17 272 lb 4.8 oz (123.5 kg)  08/25/17 267 lb 3.2 oz (121.2 kg)     ASSESSMENT AND PLAN:  Discussed the following assessment and plan:  Essential hypertension - Plan: Basic metabolic panel, CBC with Differential/Platelet, Hepatic function panel, Lipid panel, TSH, Vitamin B12, Iron, TIBC and Ferritin Panel  Medication management - Plan: Basic metabolic panel, CBC with Differential/Platelet, Hepatic function panel, Lipid panel, TSH, Vitamin B12, Iron, TIBC and Ferritin Panel  PSTPRC STATUS, BARIATRIC SURGERY - Plan: Basic metabolic panel, CBC with Differential/Platelet, Hepatic function panel, Lipid panel, TSH, Vitamin B12, Iron, TIBC and Ferritin Panel  Anemia, unspecified type - Plan: POC Hemoglobin, Basic metabolic panel, CBC with Differential/Platelet, Hepatic function panel, Lipid panel, TSH, Vitamin B12, Iron, TIBC and Ferritin Panel  Iron deficiency anemia, unspecified iron deficiency anemia type - Plan: Basic metabolic panel, CBC with Differential/Platelet, Hepatic function panel, Lipid panel, TSH, Vitamin B12, Iron, TIBC and Ferritin Panel  Low vitamin B12 level - Plan: Basic metabolic panel, CBC with Differential/Platelet, Hepatic function panel, Lipid panel, TSH, Vitamin B12,  Iron, TIBC and Ferritin Panel, cyanocobalamin ((VITAMIN B-12)) injection 1,000 mcg  lab for preventive health examination - Plan: Basic metabolic panel, CBC with Differential/Platelet, Hepatic function panel, Lipid panel, TSH, Vitamin B12, Iron, TIBC and Ferritin Panel, cyanocobalamin ((VITAMIN B-12)) injection 1,000 mcg  Perimenopausal Much  improved so far losing weight taking med  Anemia stable is perimenopausal and will follow  cpx meds and  Labs pre visit in  Late October or as needed    Expectant management.  -Patient advised to return or notify health care team  if  new concerns arise.  Patient Instructions   b12 injection today  Stay on   Vitamins.  cpx and labs pre visit  End October  As planned   Keep up th diet changes   Weight loss and BP     Control .   Wt Readings from Last 3 Encounters:  04/27/18 263 lb 11.2 oz (119.6 kg)  10/12/17 272 lb 4.8 oz (123.5 kg)  08/25/17 267 lb 3.2 oz (121.2 kg)       [name] K. Kaliann Coryell M.D.

## 2018-04-27 ENCOUNTER — Ambulatory Visit (INDEPENDENT_AMBULATORY_CARE_PROVIDER_SITE_OTHER): Payer: BLUE CROSS/BLUE SHIELD | Admitting: Internal Medicine

## 2018-04-27 ENCOUNTER — Encounter: Payer: Self-pay | Admitting: Internal Medicine

## 2018-04-27 VITALS — BP 122/68 | HR 72 | Temp 97.9°F | Wt 263.7 lb

## 2018-04-27 DIAGNOSIS — Z9884 Bariatric surgery status: Secondary | ICD-10-CM

## 2018-04-27 DIAGNOSIS — I10 Essential (primary) hypertension: Secondary | ICD-10-CM | POA: Diagnosis not present

## 2018-04-27 DIAGNOSIS — Z79899 Other long term (current) drug therapy: Secondary | ICD-10-CM | POA: Diagnosis not present

## 2018-04-27 DIAGNOSIS — D649 Anemia, unspecified: Secondary | ICD-10-CM

## 2018-04-27 DIAGNOSIS — N951 Menopausal and female climacteric states: Secondary | ICD-10-CM | POA: Diagnosis not present

## 2018-04-27 DIAGNOSIS — Z0000 Encounter for general adult medical examination without abnormal findings: Secondary | ICD-10-CM

## 2018-04-27 DIAGNOSIS — E538 Deficiency of other specified B group vitamins: Secondary | ICD-10-CM | POA: Diagnosis not present

## 2018-04-27 DIAGNOSIS — D509 Iron deficiency anemia, unspecified: Secondary | ICD-10-CM

## 2018-04-27 LAB — POCT HEMOGLOBIN: HEMOGLOBIN: 10.6 g/dL — AB (ref 12.2–16.2)

## 2018-04-27 MED ORDER — VERAPAMIL HCL ER 240 MG PO TBCR: 240.0000 mg | EXTENDED_RELEASE_TABLET | Freq: Every day | ORAL | 1 refills | Status: DC

## 2018-04-27 MED ORDER — CYANOCOBALAMIN 1000 MCG/ML IJ SOLN
1000.0000 ug | Freq: Once | INTRAMUSCULAR | Status: AC
  Administered 2018-04-27: 1000 ug via INTRAMUSCULAR

## 2018-04-27 NOTE — Patient Instructions (Signed)
b12 injection today  Stay on   Vitamins.  cpx and labs pre visit  End October  As planned   Keep up th diet changes   Weight loss and BP     Control .   Wt Readings from Last 3 Encounters:  04/27/18 263 lb 11.2 oz (119.6 kg)  10/12/17 272 lb 4.8 oz (123.5 kg)  08/25/17 267 lb 3.2 oz (121.2 kg)

## 2018-05-27 ENCOUNTER — Other Ambulatory Visit: Payer: Self-pay | Admitting: Internal Medicine

## 2018-05-29 NOTE — Telephone Encounter (Signed)
LOV on 04/27/2018 and last wrote 04/25/2018

## 2018-09-13 ENCOUNTER — Ambulatory Visit (INDEPENDENT_AMBULATORY_CARE_PROVIDER_SITE_OTHER): Payer: BLUE CROSS/BLUE SHIELD | Admitting: Family Medicine

## 2018-09-13 ENCOUNTER — Encounter: Payer: Self-pay | Admitting: Family Medicine

## 2018-09-13 VITALS — BP 142/100 | HR 59 | Temp 98.5°F | Wt 248.3 lb

## 2018-09-13 DIAGNOSIS — R600 Localized edema: Secondary | ICD-10-CM | POA: Diagnosis not present

## 2018-09-13 DIAGNOSIS — I10 Essential (primary) hypertension: Secondary | ICD-10-CM

## 2018-09-13 MED ORDER — HYDROCHLOROTHIAZIDE 25 MG PO TABS: 25.0000 mg | ORAL_TABLET | Freq: Every day | ORAL | 0 refills | Status: DC

## 2018-09-13 NOTE — Progress Notes (Signed)
   Subjective:    Patient ID: Brooke Le, female    DOB: Oct 10, 1966, 52 y.o.   MRN: [id number]  HPI Here for swelling in the feet and ankles. She has had this for some time, but this summer it has been worse than usual. Her feet will have mild pain at times. No SOB. Her last labs in Sept. 2018 showed normal renal function. Her BP has been running a little high, even though her Verapamil dose has been bumped up a few times.    Review of Systems  Constitutional: Negative.   Respiratory: Negative.   Cardiovascular: Positive for leg swelling. Negative for chest pain and palpitations.  Neurological: Negative.        Objective:   Physical Exam  Constitutional: She is oriented to person, place, and time. She appears well-developed.  Cardiovascular: Normal rate, regular rhythm, normal heart sounds and intact distal pulses.  Pulmonary/Chest: Effort normal and breath sounds normal. No stridor. No respiratory distress. She has no wheezes. She has no rales.  Musculoskeletal:  2+ edema in both ankles   Neurological: She is alert and oriented to person, place, and time.          Assessment & Plan:  We will add HCTZ 25 mg daily to the Verapamil. This should help bring the BP down as well as help with swelling. She will follow up with Dr. Regis Bill in 3-4 weeks as part of her well exam.  Alysia Penna, MD

## 2018-10-05 ENCOUNTER — Other Ambulatory Visit: Payer: Self-pay | Admitting: Family Medicine

## 2018-10-17 NOTE — Progress Notes (Signed)
Chief Complaint  Patient presents with  . Annual Exam    HPI: Patient  Brooke Le  52 y.o. comes in today for Preventive Health Care visit  And Chronic disease management HT better on current meds taking hctz about 3-4 x per week for edema and limt s cause of commute but can take daily . Sleep still limited take tca if needed and has time to sleep  No cv pulm sx at this time. phq 0 - 2 fatigue  Not depreessive   Health Maintenance  Topic Date Due  . COLONOSCOPY  03/06/2016  . TETANUS/TDAP  08/21/2018  . INFLUENZA VACCINE  03/14/2019 (Originally 07/13/2018)  . PAP SMEAR  09/08/2019  . MAMMOGRAM  09/29/2019  . HIV Screening  Completed   Health Maintenance Review LIFESTYLE:  Exercise:   Walks   Tobacco/ETS:  no Alcohol:  Social  Sugar beverages:  "Cut down on that " mostly water  Soda every 3-4 day .  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  ca retaking aunt  .    Take b omplex    Periods   2 weeks ago and not from may  Moms aunt and    A cousin died  Colon cancer and uncle  Father prostate cancer    ROS:  GEN/ HEENT: No fever, significant weight changes sweats headaches vision problems hearing changes, CV/ PULM; No chest pain shortness of breath cough, syncope,edema  change in exercise tolerance. GI /GU: No adominal pain, vomiting, change in bowel habits. No blood in the stool. No significant GU symptoms. SKIN/HEME: ,no acute skin rashes suspicious lesions or bleeding. No lymphadenopathy, nodules, masses.  NEURO/ PSYCH:  No neurologic signs such as weakness numbness. No depression anxiety. IMM/ Allergy: No unusual infections.  Allergy .   REST of 12 system review negative except as per HPI   Past Medical History:  Diagnosis Date  . Allergic rhinitis   . Anemia, iron deficiency   . Fibromyalgia   . Gynecological examination    Dr. Nori Riis  . [id number](093.2) 08/21/2007   Qualifier: Diagnosis of  By: Regis Bill MD, Standley Brooking   . Hypertension    had decreased with weight loss  . Low vitamin B12 level   . Migraine headache    hx of seen in [facility] in the remote past  . Obesity   . 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 Syncope    ER evaluation in 2008; had anemia then    Past Surgical History:  Procedure Laterality Date  . ADENOIDECTOMY    . ESOPHAGOGASTRODUODENOSCOPY  10/31/2002  . GASTRIC BYPASS    . TUBAL LIGATION      Family History  Problem Relation Age of Onset  . Alcohol abuse Other   . Diabetes Other   . Hypertension Other     Social History   Socioeconomic History  . Marital status: Married    Spouse name: Not on file  . Number of children: Not on file  . Years of education: Not on file  . Highest education level: Not on file  Occupational History  . Not on file  Social Needs  . Financial resource strain: Not on file  . Food insecurity:    Worry:  Not on file    Inability: Not on file  . Transportation needs:    Medical: Not on file    Non-medical: Not on file  Tobacco Use  . Smoking status: Never Smoker  . Smokeless tobacco: Never Used  Substance and Sexual Activity  . Alcohol use: Not on file  . Drug use: Not on file  . Sexual activity: Not on file  Lifestyle  . Physical activity:    Days per week: Not on file    Minutes per session: Not on file  . Stress: Not on file  Relationships  . Social connections:    Talks on phone: Not on file    Gets together: Not on file    Attends religious service: Not on file    Active member of club or organization: Not on file    Attends meetings of clubs or organizations: Not on file    Relationship status: Not on file  Other Topics Concern  . Not on file  Social History Narrative   Married   Regular Exercise- no   Laid off [employer]- July 2011 lost insurance in [place]   Back to work at [employer]  long shifts    Now am Actuary   hh of 6 teens    No pets              Outpatient Medications Prior to Visit  Medication Sig Dispense Refill  . amitriptyline (ELAVIL) 10 MG tablet TAKE 3 TABLETS (30 MG TOTAL) BY MOUTH AT BEDTIME. 270 tablet 1  . hydrochlorothiazide (HYDRODIURIL) 25 MG tablet Take 1 tablet (25 mg total) by mouth daily. 30 tablet 0  . verapamil (CALAN-SR) 240 MG CR tablet Take 1 tablet (240 mg total) by mouth at bedtime. 90 tablet 1   No facility-administered medications prior to visit.      EXAM:  BP 110/78 (BP Location: Left Arm, Patient Position: Sitting, Cuff Size: Large)   Pulse (!) 58   Temp 98.4 F (36.9 C) (Oral)   Ht 5' 7.5" (1.715 m)   Wt 237 lb 6.4 oz (107.7 kg)   LMP 10/04/2018 (Approximate)   SpO2 96%   BMI 36.63 kg/m   Body mass index is 36.63 kg/m. Wt Readings from Last 3 Encounters:  10/18/18 237 lb 6.4 oz (107.7 kg)  09/13/18 248 lb 5 oz (112.6 kg)  04/27/18 263 lb 11.2 oz (119.6 kg)    Physical Exam: Vital signs reviewed [id number] is a well-developed well-nourished alert cooperative    who appearsr stated age in no acute distress.  HEENT: normocephalic atraumatic , Eyes: PERRL EOM's full, conjunctiva clear, Nares: paten,t no deformity discharge or tenderness., Ears: no deformity EAC's clear TMs with normal landmarks. Mouth: clear OP, no lesions, edema.  Moist mucous membranes. Dentition in adequate repair. NECK: supple without masses, thyromegaly or bruits. CHEST/PULM:  Clear to auscultation and percussion breath sounds equal no wheeze , rales or rhonchi. No chest wall deformities or tenderness. Breast: normal by inspection . No dimpling, discharge, masses, tenderness or discharge . CV: PMI is nondisplaced, S1 S2 no gallops, murmurs, rubs. Peripheral pulses are full without delay.No JVD .  ABDOMEN: Bowel sounds normal nontender  No guard or rebound, no hepato splenomegal no CVA tenderness.  No hernia. Extremtities:  No clubbing  cyanosis or edema, no acute joint swelling or redness no focal atrophy NEURO:  Oriented x3, cranial nerves 3-12 appear to be intact, no obvious focal weakness,gait within normal limits no  abnormal reflexes or asymmetrical SKIN: No acute rashes normal turgor, color, no bruising or petechiae. PSYCH: Oriented, good eye contact, no obvious depression anxiety, cognition and judgment appear normal. LN: no cervical axillary inguinal adenopathy  Lab Results  Component Value Date   WBC 4.3 08/25/2017   HGB 10.6 (A) 04/27/2018   HCT 33.1 (L) 08/25/2017   PLT 257.0 08/25/2017   GLUCOSE 99 08/25/2017   CHOL 168 08/25/2017   TRIG 58.0 08/25/2017   HDL 56.60 08/25/2017   LDLCALC 100 (H) 08/25/2017   ALT 10 08/25/2017   AST 13 08/25/2017   NA 140 08/25/2017   K 4.2 08/25/2017   CL 105 08/25/2017   CREATININE 1.03 08/25/2017   BUN 11 08/25/2017   CO2 31 08/25/2017   TSH 1.50 09/01/2016   HGBA1C 6.0 12/09/2010    BP Readings from Last 3 Encounters:  10/18/18 110/78  09/13/18 (!) 142/100  04/27/18 122/68    Lab plan  reviewed with patient   ASSESSMENT AND PLAN:  Discussed the following assessment and plan:  Visit for preventive health examination - Plan: Basic metabolic panel, CBC with Differential/Platelet, Hepatic function panel, Lipid panel, TSH, Vitamin B12, VITAMIN D 25 Hydroxy (Vit-D Deficiency, Fractures)  Medication management - Plan: Basic metabolic panel, CBC with Differential/Platelet, Hepatic function panel, Lipid panel, TSH, Vitamin B12, VITAMIN D 25 Hydroxy (Vit-D Deficiency, Fractures)  Iron deficiency anemia, unspecified iron deficiency anemia type - Plan: Basic metabolic panel, CBC with Differential/Platelet, Hepatic function panel, Lipid panel, TSH, Vitamin B12, VITAMIN D 25 Hydroxy (Vit-D Deficiency, Fractures)  Essential hypertension - Plan: Basic metabolic panel, CBC with Differential/Platelet, Hepatic function panel, Lipid panel, TSH, Vitamin B12, VITAMIN D 25  Hydroxy (Vit-D Deficiency, Fractures)  PSTPRC STATUS, BARIATRIC SURGERY - Plan: Basic metabolic panel, CBC with Differential/Platelet, Hepatic function panel, Lipid panel, TSH, Vitamin B12, VITAMIN D 25 Hydroxy (Vit-D Deficiency, Fractures)  Low vitamin B12 level - Plan: Vitamin B12  Vitamin D deficiency - Plan: VITAMIN D 25 Hydroxy (Vit-D Deficiency, Fractures)  Perimenopausal  Colon cancer screening - Plan: Ambulatory referral to Gastroenterology  Family history of colon cancer - Plan: Ambulatory referral to Gastroenterology  Influenza vaccination declined by patient bp much better    Taking med and losing weight in a healthy manner   Goal is 200 . Advise use hctz qd or 1/2 qd  ( drives commutes  and sometimes limits to avoid  Having to urinate)  Refer colon    Works long nights and sleeps am  Difficult phone contact so prefer to use my chart to contact  Other times .  Is perimenopausal and doing better taking   Vit with b complex Patient Care Team: Panosh, Standley Brooking, MD as PCP - General Patient Instructions  Lab today Will notify you  of labs when available.  will refer  for colonoscopy.    Continue lifestyle intervention healthy eating and exercise . And healhty weight loss .   Yearly check  depedending on lab results  And how doing .    Preventive Care 40-64 Years, Female Preventive care refers to lifestyle choices and visits with your health care provider that can promote health and wellness. What does preventive care include?  A yearly physical exam. This is also called an annual well check.  Dental exams once or twice a year.  Routine eye exams. Ask your health care provider how often you should have your eyes checked.  Personal lifestyle choices, including: ? Daily care of your teeth and gums. ?  Regular physical activity. ? Eating a healthy diet. ? Avoiding tobacco and drug use. ? Limiting alcohol use. ? Practicing safe sex. ? Taking low-dose aspirin daily  starting at age 50. ? Taking vitamin and mineral supplements as recommended by your health care provider. What happens during an annual well check? The services and screenings done by your health care provider during your annual well check will depend on your age, overall health, lifestyle risk factors, and family history of disease. Counseling Your health care provider may ask you questions about your:  Alcohol use.  Tobacco use.  Drug use.  Emotional well-being.  Home and relationship well-being.  Sexual activity.  Eating habits.  Work and work Statistician.  Method of birth control.  Menstrual cycle.  Pregnancy history.  Screening You may have the following tests or measurements:  Height, weight, and BMI.  Blood pressure.  Lipid and cholesterol levels. These may be checked every 5 years, or more frequently if you are over 60 years old.  Skin check.  Lung cancer screening. You may have this screening every year starting at age 8 if you have a 30-pack-year history of smoking and currently smoke or have quit within the past 15 years.  Fecal occult blood test (FOBT) of the stool. You may have this test every year starting at age 40.  Flexible sigmoidoscopy or colonoscopy. You may have a sigmoidoscopy every 5 years or a colonoscopy every 10 years starting at age 61.  Hepatitis C blood test.  Hepatitis B blood test.  Sexually transmitted disease (STD) testing.  Diabetes screening. This is done by checking your blood sugar (glucose) after you have not eaten for a while (fasting). You may have this done every 1-3 years.  Mammogram. This may be done every 1-2 years. Talk to your health care provider about when you should start having regular mammograms. This may depend on whether you have a family history of breast cancer.  BRCA-related cancer screening. This may be done if you have a family history of breast, ovarian, tubal, or peritoneal cancers.  Pelvic exam and  Pap test. This may be done every 3 years starting at age 52. Starting at age 40, this may be done every 5 years if you have a Pap test in combination with an HPV test.  Bone density scan. This is done to screen for osteoporosis. You may have this scan if you are at high risk for osteoporosis.  Discuss your test results, treatment options, and if necessary, the need for more tests with your health care provider. Vaccines Your health care provider may recommend certain vaccines, such as:  Influenza vaccine. This is recommended every year.  Tetanus, diphtheria, and acellular pertussis (Tdap, Td) vaccine. You may need a Td booster every 10 years.  Varicella vaccine. You may need this if you have not been vaccinated.  Zoster vaccine. You may need this after age 58.  Measles, mumps, and rubella (MMR) vaccine. You may need at least one dose of MMR if you were born in 1957 or later. You may also need a second dose.  Pneumococcal 13-valent conjugate (PCV13) vaccine. You may need this if you have certain conditions and were not previously vaccinated.  Pneumococcal polysaccharide (PPSV23) vaccine. You may need one or two doses if you smoke cigarettes or if you have certain conditions.  Meningococcal vaccine. You may need this if you have certain conditions.  Hepatitis A vaccine. You may need this if you have certain conditions or if you  travel or work in places where you may be exposed to hepatitis A.  Hepatitis B vaccine. You may need this if you have certain conditions or if you travel or work in places where you may be exposed to hepatitis B.  Haemophilus influenzae type b (Hib) vaccine. You may need this if you have certain conditions.  Talk to your health care provider about which screenings and vaccines you need and how often you need them. This information is not intended to replace advice given to you by your health care provider. Make sure you discuss any questions you have with your  health care provider. Document Released: 12/26/2015 Document Revised: 08/18/2016 Document Reviewed: 09/30/2015 Elsevier Interactive Patient Education  2018 [place]. Panosh M.D.

## 2018-10-18 ENCOUNTER — Ambulatory Visit (INDEPENDENT_AMBULATORY_CARE_PROVIDER_SITE_OTHER): Payer: BLUE CROSS/BLUE SHIELD | Admitting: Internal Medicine

## 2018-10-18 ENCOUNTER — Encounter: Payer: Self-pay | Admitting: Internal Medicine

## 2018-10-18 VITALS — BP 110/78 | HR 58 | Temp 98.4°F | Ht 67.5 in | Wt 237.4 lb

## 2018-10-18 DIAGNOSIS — E538 Deficiency of other specified B group vitamins: Secondary | ICD-10-CM

## 2018-10-18 DIAGNOSIS — Z0000 Encounter for general adult medical examination without abnormal findings: Secondary | ICD-10-CM | POA: Diagnosis not present

## 2018-10-18 DIAGNOSIS — N951 Menopausal and female climacteric states: Secondary | ICD-10-CM

## 2018-10-18 DIAGNOSIS — Z23 Encounter for immunization: Secondary | ICD-10-CM

## 2018-10-18 DIAGNOSIS — Z2821 Immunization not carried out because of patient refusal: Secondary | ICD-10-CM

## 2018-10-18 DIAGNOSIS — E559 Vitamin D deficiency, unspecified: Secondary | ICD-10-CM

## 2018-10-18 DIAGNOSIS — Z79899 Other long term (current) drug therapy: Secondary | ICD-10-CM

## 2018-10-18 DIAGNOSIS — R7989 Other specified abnormal findings of blood chemistry: Secondary | ICD-10-CM

## 2018-10-18 DIAGNOSIS — I10 Essential (primary) hypertension: Secondary | ICD-10-CM

## 2018-10-18 DIAGNOSIS — D509 Iron deficiency anemia, unspecified: Secondary | ICD-10-CM

## 2018-10-18 DIAGNOSIS — Z1211 Encounter for screening for malignant neoplasm of colon: Secondary | ICD-10-CM

## 2018-10-18 DIAGNOSIS — Z9884 Bariatric surgery status: Secondary | ICD-10-CM

## 2018-10-18 DIAGNOSIS — Z800 Family history of malignant neoplasm of digestive organs: Secondary | ICD-10-CM

## 2018-10-18 LAB — CBC WITH DIFFERENTIAL/PLATELET
Basophils Absolute: 0 10*3/uL (ref 0.0–0.1)
Basophils Relative: 0.6 % (ref 0.0–3.0)
EOS ABS: 0.1 10*3/uL (ref 0.0–0.7)
Eosinophils Relative: 2.5 % (ref 0.0–5.0)
HCT: 34.1 % — ABNORMAL LOW (ref 36.0–46.0)
Hemoglobin: 11.4 g/dL — ABNORMAL LOW (ref 12.0–15.0)
LYMPHS ABS: 1.9 10*3/uL (ref 0.7–4.0)
Lymphocytes Relative: 48.9 % — ABNORMAL HIGH (ref 12.0–46.0)
MCHC: 33.5 g/dL (ref 30.0–36.0)
MCV: 86.1 fl (ref 78.0–100.0)
Monocytes Absolute: 0.2 10*3/uL (ref 0.1–1.0)
Monocytes Relative: 6.1 % (ref 3.0–12.0)
NEUTROS ABS: 1.7 10*3/uL (ref 1.4–7.7)
NEUTROS PCT: 41.9 % — AB (ref 43.0–77.0)
PLATELETS: 239 10*3/uL (ref 150.0–400.0)
RBC: 3.97 Mil/uL (ref 3.87–5.11)
RDW: 15.1 % (ref 11.5–15.5)
WBC: 3.9 10*3/uL — ABNORMAL LOW (ref 4.0–10.5)

## 2018-10-18 LAB — HEPATIC FUNCTION PANEL
ALBUMIN: 3.6 g/dL (ref 3.5–5.2)
ALK PHOS: 115 U/L (ref 39–117)
ALT: 11 U/L (ref 0–35)
AST: 15 U/L (ref 0–37)
BILIRUBIN DIRECT: 0.2 mg/dL (ref 0.0–0.3)
Total Bilirubin: 1.1 mg/dL (ref 0.2–1.2)
Total Protein: 6.4 g/dL (ref 6.0–8.3)

## 2018-10-18 LAB — BASIC METABOLIC PANEL
BUN: 11 mg/dL (ref 6–23)
CALCIUM: 9 mg/dL (ref 8.4–10.5)
CO2: 30 meq/L (ref 19–32)
CREATININE: 1.11 mg/dL (ref 0.40–1.20)
Chloride: 102 mEq/L (ref 96–112)
GFR: 66.22 mL/min (ref >60.00)
GLUCOSE: 85 mg/dL (ref 70–99)
Potassium: 4 mEq/L (ref 3.5–5.1)
SODIUM: 139 meq/L (ref 135–145)

## 2018-10-18 LAB — TSH: TSH: 0.49 u[IU]/mL (ref 0.35–4.50)

## 2018-10-18 LAB — LIPID PANEL
Cholesterol: 144 mg/dL (ref 0–200)
HDL: 45.9 mg/dL (ref >39.00)
LDL Cholesterol: 88 mg/dL (ref 0–99)
NONHDL: 97.94
TRIGLYCERIDES: 51 mg/dL (ref 0.0–149.0)
Total CHOL/HDL Ratio: 3
VLDL: 10.2 mg/dL (ref 0.0–40.0)

## 2018-10-18 LAB — VITAMIN B12: Vitamin B-12: 106 pg/mL — ABNORMAL LOW (ref 211–911)

## 2018-10-18 LAB — VITAMIN D 25 HYDROXY (VIT D DEFICIENCY, FRACTURES): VITD: 15.42 ng/mL — ABNORMAL LOW (ref 30.00–100.00)

## 2018-10-18 NOTE — Addendum Note (Signed)
Addended by: Westley Hummer B on: 10/18/2018 01:43 PM   Modules accepted: Orders

## 2018-10-18 NOTE — Patient Instructions (Signed)
Lab today Will notify you  of labs when available.  will refer  for colonoscopy.    Continue lifestyle intervention healthy eating and exercise . And healhty weight loss .   Yearly check  depedending on lab results  And how doing .    Preventive Care 40-64 Years, Female Preventive care refers to lifestyle choices and visits with your health care provider that can promote health and wellness. What does preventive care include?  A yearly physical exam. This is also called an annual well check.  Dental exams once or twice a year.  Routine eye exams. Ask your health care provider how often you should have your eyes checked.  Personal lifestyle choices, including: ? Daily care of your teeth and gums. ? Regular physical activity. ? Eating a healthy diet. ? Avoiding tobacco and drug use. ? Limiting alcohol use. ? Practicing safe sex. ? Taking low-dose aspirin daily starting at age 49. ? Taking vitamin and mineral supplements as recommended by your health care provider. What happens during an annual well check? The services and screenings done by your health care provider during your annual well check will depend on your age, overall health, lifestyle risk factors, and family history of disease. Counseling Your health care provider may ask you questions about your:  Alcohol use.  Tobacco use.  Drug use.  Emotional well-being.  Home and relationship well-being.  Sexual activity.  Eating habits.  Work and work Statistician.  Method of birth control.  Menstrual cycle.  Pregnancy history.  Screening You may have the following tests or measurements:  Height, weight, and BMI.  Blood pressure.  Lipid and cholesterol levels. These may be checked every 5 years, or more frequently if you are over 63 years old.  Skin check.  Lung cancer screening. You may have this screening every year starting at age 11 if you have a 30-pack-year history of smoking and currently smoke  or have quit within the past 15 years.  Fecal occult blood test (FOBT) of the stool. You may have this test every year starting at age 70.  Flexible sigmoidoscopy or colonoscopy. You may have a sigmoidoscopy every 5 years or a colonoscopy every 10 years starting at age 24.  Hepatitis C blood test.  Hepatitis B blood test.  Sexually transmitted disease (STD) testing.  Diabetes screening. This is done by checking your blood sugar (glucose) after you have not eaten for a while (fasting). You may have this done every 1-3 years.  Mammogram. This may be done every 1-2 years. Talk to your health care provider about when you should start having regular mammograms. This may depend on whether you have a family history of breast cancer.  BRCA-related cancer screening. This may be done if you have a family history of breast, ovarian, tubal, or peritoneal cancers.  Pelvic exam and Pap test. This may be done every 3 years starting at age 75. Starting at age 34, this may be done every 5 years if you have a Pap test in combination with an HPV test.  Bone density scan. This is done to screen for osteoporosis. You may have this scan if you are at high risk for osteoporosis.  Discuss your test results, treatment options, and if necessary, the need for more tests with your health care provider. Vaccines Your health care provider may recommend certain vaccines, such as:  Influenza vaccine. This is recommended every year.  Tetanus, diphtheria, and acellular pertussis (Tdap, Td) vaccine. You may need a  Td booster every 10 years.  Varicella vaccine. You may need this if you have not been vaccinated.  Zoster vaccine. You may need this after age 6.  Measles, mumps, and rubella (MMR) vaccine. You may need at least one dose of MMR if you were born in 1957 or later. You may also need a second dose.  Pneumococcal 13-valent conjugate (PCV13) vaccine. You may need this if you have certain conditions and were  not previously vaccinated.  Pneumococcal polysaccharide (PPSV23) vaccine. You may need one or two doses if you smoke cigarettes or if you have certain conditions.  Meningococcal vaccine. You may need this if you have certain conditions.  Hepatitis A vaccine. You may need this if you have certain conditions or if you travel or work in places where you may be exposed to hepatitis A.  Hepatitis B vaccine. You may need this if you have certain conditions or if you travel or work in places where you may be exposed to hepatitis B.  Haemophilus influenzae type b (Hib) vaccine. You may need this if you have certain conditions.  Talk to your health care provider about which screenings and vaccines you need and how often you need them. This information is not intended to replace advice given to you by your health care provider. Make sure you discuss any questions you have with your health care provider. Document Released: 12/26/2015 Document Revised: 08/18/2016 Document Reviewed: 09/30/2015 Elsevier Interactive Patient Education  [employer].

## 2018-10-20 ENCOUNTER — Encounter: Payer: Self-pay | Admitting: *Deleted

## 2018-10-26 ENCOUNTER — Ambulatory Visit (INDEPENDENT_AMBULATORY_CARE_PROVIDER_SITE_OTHER): Payer: BLUE CROSS/BLUE SHIELD | Admitting: *Deleted

## 2018-10-26 DIAGNOSIS — E538 Deficiency of other specified B group vitamins: Secondary | ICD-10-CM

## 2018-10-26 DIAGNOSIS — R7989 Other specified abnormal findings of blood chemistry: Secondary | ICD-10-CM

## 2018-10-26 MED ORDER — CYANOCOBALAMIN 1000 MCG/ML IJ SOLN
1000.0000 ug | Freq: Once | INTRAMUSCULAR | Status: AC
  Administered 2018-10-26: 1000 ug via INTRAMUSCULAR

## 2018-10-26 NOTE — Progress Notes (Signed)
Per orders of Dr. Panosh, injection of B12 given by Anecia Nusbaum. Patient tolerated injection well. 

## 2018-11-14 NOTE — Telephone Encounter (Signed)
Dr. Panosh patient. 

## 2018-11-23 ENCOUNTER — Encounter: Payer: Self-pay | Admitting: Gastroenterology

## 2018-12-18 ENCOUNTER — Ambulatory Visit (INDEPENDENT_AMBULATORY_CARE_PROVIDER_SITE_OTHER): Payer: BLUE CROSS/BLUE SHIELD | Admitting: *Deleted

## 2018-12-18 DIAGNOSIS — E538 Deficiency of other specified B group vitamins: Secondary | ICD-10-CM

## 2018-12-18 MED ORDER — CYANOCOBALAMIN 1000 MCG/ML IJ SOLN
1000.0000 ug | Freq: Once | INTRAMUSCULAR | Status: AC
  Administered 2018-12-18: 1000 ug via INTRAMUSCULAR

## 2018-12-22 ENCOUNTER — Telehealth: Payer: Self-pay | Admitting: *Deleted

## 2018-12-22 NOTE — Telephone Encounter (Signed)
Patient no show PV today. Called pt, no answer, left message for her to call back today before 5 pm to reschedule or colon will be cancelled.

## 2019-01-05 ENCOUNTER — Encounter: Payer: BLUE CROSS/BLUE SHIELD | Admitting: Gastroenterology

## 2019-06-26 ENCOUNTER — Other Ambulatory Visit: Payer: Self-pay | Admitting: Internal Medicine

## 2019-10-19 NOTE — Progress Notes (Signed)
Chief Complaint  Patient presents with  . Annual Exam  . Medication Management  . Hypertension    HPI: Patient  Brooke Le  53 y.o. comes in today for Preventive Health Care visit  And Chronic disease management .    BP not too bad Weight put it back on since  Last year  covid and  Stress Has  Back  Since on site. Father prostate cancer  Helping  rx .  Know needs to get back to better weight  Still hasn't gotten colonoscopy  She has delayed   Pos family history  Taking b12 no other vits  HAs inc since inc weight but ok  Taking 2 10  Elavil at night.    Health Maintenance  Topic Date Due  . COLONOSCOPY  03/06/2016  . MAMMOGRAM  09/29/2019  . PAP SMEAR-Modifier  09/08/2019  . INFLUENZA VACCINE  03/12/2020 (Originally 07/14/2019)  . TETANUS/TDAP  10/18/2028  . 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   Less stress .     8 hours  Work nights    ROS:  GEN/ HEENT: No fever, significant weight changes sweats headaches vision problems hearing changes, CV/ PULM; No chest pain shortness of breath cough, syncope,edema  change in exercise tolerance. GI /GU: No adominal pain, vomiting, change in bowel habits. No blood in the stool. No significant GU symptoms. SKIN/HEME: ,no acute skin rashes suspicious lesions or bleeding. No lymphadenopathy, nodules, masses.  NEURO/ PSYCH:  No neurologic signs such as weakness numbness. No depression anxiety. IMM/ Allergy: No unusual infections.  Allergy .   REST of 12 system review negative except as per HPI   Past Medical History:  Diagnosis Date  . Allergic rhinitis   . Anemia, iron deficiency   . Fibromyalgia   . Gynecological examination    Dr. Nori Riis  . [id number](354.6) 08/21/2007   Qualifier:  Diagnosis of  By: Regis Bill MD, Standley Brooking   . Hypertension    had decreased with weight loss  . Low vitamin B12 level   . Migraine headache    hx of seen in [facility] in the remote past  . Obesity   . 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 Syncope    ER evaluation in 2008; had anemia then    Past Surgical History:  Procedure Laterality Date  . ADENOIDECTOMY    . ESOPHAGOGASTRODUODENOSCOPY  10/31/2002  . GASTRIC BYPASS    . TUBAL LIGATION      Family History  Problem Relation Age of Onset  . Alcohol abuse Other   . Diabetes Other   . Hypertension Other     Social History   Socioeconomic History  . Marital status: Married    Spouse name: Not on file  . Number of children: Not on file  . Years of education: Not on file  . Highest education level: Not on file  Occupational History  . Not on file  Social Needs  . Financial resource strain: Not on file  . Food insecurity    Worry: Not on file  Inability: Not on file  . Transportation needs    Medical: Not on file    Non-medical: Not on file  Tobacco Use  . Smoking status: Never Smoker  . Smokeless tobacco: Never Used  Substance and Sexual Activity  . Alcohol use: Not on file  . Drug use: Not on file  . Sexual activity: Not on file  Lifestyle  . Physical activity    Days per week: Not on file    Minutes per session: Not on file  . Stress: Not on file  Relationships  . Social Herbalist on phone: Not on file    Gets together: Not on file    Attends religious service: Not on file    Active member of club or organization: Not on file    Attends meetings of clubs or organizations: Not on file    Relationship status: Not on file  Other Topics Concern  . Not on file  Social History Narrative   Married   Regular Exercise- no   Laid off [employer]- July 2011 lost insurance in [place]   Back to work at [employer] long shifts     Now am Actuary   hh of 6 teens    No pets              Outpatient Medications Prior to Visit  Medication Sig Dispense Refill  . amitriptyline (ELAVIL) 10 MG tablet TAKE 3 TABLETS (30 MG TOTAL) BY MOUTH AT BEDTIME. 270 tablet 1  . Black Elderberry,Berry-Flower, 575 MG CAPS Take by mouth.    . hydrochlorothiazide (HYDRODIURIL) 25 MG tablet TAKE 1 TABLET BY MOUTH EVERY DAY 30 tablet 0  . verapamil (CALAN-SR) 240 MG CR tablet TAKE 1 TABLET (240 MG TOTAL) BY MOUTH AT BEDTIME. 90 tablet 1  . vitamin B-12 (CYANOCOBALAMIN) 1000 MCG tablet Take 1,000 mcg by mouth daily.     No facility-administered medications prior to visit.      EXAM:  BP 128/80 (BP Location: Right Arm, Patient Position: Sitting, Cuff Size: Normal)   Pulse 86   Temp 97.8 F (36.6 C) (Temporal)   Ht _0  (1.727 m)   Wt 275 lb 3.2 oz (124.8 kg)   SpO2 97%   BMI 41.84 kg/m   Body mass index is 41.84 kg/m. Wt Readings from Last 3 Encounters:  10/22/19 275 lb 3.2 oz (124.8 kg)  10/18/18 237 lb 6.4 oz (107.7 kg)  09/13/18 248 lb 5 oz (112.6 kg)    Physical Exam: Vital signs reviewed WIO:MBTD is a well-developed well-nourished alert cooperative    who appearsr stated age in no acute distress.  HEENT: normocephalic atraumatic , Eyes: PERRL EOM's full, conjunctiva clear, , Ears: no deformity EAC's clear TMs with normal landmarks. Mouth masked  NECK: supple without masses, thyromegaly or bruits. CHEST/PULM:  Clear to auscultation and percussion breath sounds equal no wheeze , rales or rhonchi. No chest wall deformities or tenderness.  CV: PMI is nondisplaced, S1 S2 no gallops, murmurs, rubs. Peripheral pulses are full without delay.No JVD .  ABDOMEN: Bowel sounds normal nontender  No guard or rebound, no hepato splenomegal no CVA tenderness.   Extremtities:  No clubbing cyanosis or edema, no acute joint swelling or redness no focal atrophy NEURO:  Oriented x3, cranial nerves 3-12 appear to be intact, no  obvious focal weakness,gait within normal limits no abnormal reflexes or asymmetrical SKIN: No acute rashes normal turgor, color, no bruising or petechiae. PSYCH:  Oriented, good eye contact, no obvious depression anxiety, cognition and judgment appear normal. LN: no cervical axillary inguinal adenopathy  Lab Results  Component Value Date   WBC 4.2 10/22/2019   HGB 11.8 (L) 10/22/2019   HCT 35.8 (L) 10/22/2019   PLT 230.0 10/22/2019   GLUCOSE 94 10/22/2019   CHOL 173 10/22/2019   TRIG 63.0 10/22/2019   HDL 51.60 10/22/2019   LDLCALC 109 (H) 10/22/2019   ALT 18 10/22/2019   AST 18 10/22/2019   NA 139 10/22/2019   K 4.5 10/22/2019   CL 104 10/22/2019   CREATININE 1.02 10/22/2019   BUN 16 10/22/2019   CO2 28 10/22/2019   TSH 1.01 10/22/2019   HGBA1C 5.7 10/22/2019    BP Readings from Last 3 Encounters:  10/22/19 128/80  10/18/18 110/78  09/13/18 (!) 142/100   Last period   About over a year.  Lab results reviewed with patient   ASSESSMENT AND PLAN:  Discussed the following assessment and plan:    ICD-10-CM   1. Visit for preventive health examination  H41.74 Basic metabolic panel    CBC with Differential    Hemoglobin A1c    Hepatic function panel    Lipid panel    TSH    T4, Free (Thyrox)    B12    Vitamin D 25 hydroxy    IBC + Ferritin [place]  2. Medication management  Y81.448 Basic metabolic panel    CBC with Differential    Hemoglobin A1c    Hepatic function panel    Lipid panel    TSH    T4, Free (Thyrox)    B12    Vitamin D 25 hydroxy    IBC + Ferritin [place]  3. Low vitamin B12 level  J85.6 Basic metabolic panel    CBC with Differential    Hemoglobin A1c    Hepatic function panel    Lipid panel    TSH    T4, Free (Thyrox)    B12    Vitamin D 25 hydroxy    IBC + Ferritin [place]  4. Iron deficiency anemia, unspecified iron deficiency anemia type  D14.9 Basic metabolic panel    CBC with Differential    Hemoglobin A1c    Hepatic function  panel    Lipid panel    TSH    T4, Free (Thyrox)    B12    Vitamin D 25 hydroxy    IBC + Ferritin [place]  5. Essential hypertension  F02 Basic metabolic panel    CBC with Differential    Hemoglobin A1c    Hepatic function panel    Lipid panel    TSH    T4, Free (Thyrox)    B12    Vitamin D 25 hydroxy    IBC + Ferritin [place]  6. Vitamin D deficiency  O37.8 Basic metabolic panel    CBC with Differential    Hemoglobin A1c    Hepatic function panel    Lipid panel    TSH    T4, Free (Thyrox)    B12    Vitamin D 25 hydroxy    IBC + Ferritin [place]  7. PSTPRC STATUS, BARIATRIC SURGERY  H88.50 Basic metabolic panel    CBC with Differential    Hemoglobin A1c    Hepatic function panel    Lipid panel    TSH    T4, Free (Thyrox)    B12    Vitamin D 25 hydroxy  IBC + Ferritin [place]  8. Anemia, unspecified type  G86.7 Basic metabolic panel    CBC with Differential    Hemoglobin A1c    Hepatic function panel    Lipid panel    TSH    T4, Free (Thyrox)    B12    Vitamin D 25 hydroxy    IBC + Ferritin [place]  9. Weight gain  R63.5    38 # in one year      Bariatric  Vitamins advised  To help with  Nutrient absorption  Will see levels   Hx of elevated alk phos and vit d defi needs fu  Needs  Colonoscopy  She didn't  Fu with last  She works night Need to fu  Review record  After patient left   Noted  Never got thyroid US nor thyroid nodule check   or abd [place] for poss 1.7 are right liver  Advised  For thyroid noduleleft and liver  Patient Care Team: Sheppard Luckenbach, Standley Brooking, MD as PCP - General Patient Instructions  Lab today .  Please get back on track and track  All food drink and sleep to  help over the next 2 weeks and more consider getting a bariatric  Vitamin  In addition to  Your current vitamins .   Avoid   Simple carbohydrates  And sugars to help keep from getting diabetes and have  Sugars swim.   YOU NEED TO GET YOUR COLONSCOPY!.    How can I help you get this  done?     We can do a stool card in the interim .  Also     Health Maintenance, Female Adopting a healthy lifestyle and getting preventive care are important in promoting health and wellness. Ask your health care provider about:  The right schedule for you to have regular tests and exams.  Things you can do on your own to prevent diseases and keep yourself healthy. What should I know about diet, weight, and exercise? Eat a healthy diet   Eat a diet that includes plenty of vegetables, fruits, low-fat dairy products, and lean protein.  Do not eat a lot of foods that are high in solid fats, added sugars, or sodium. Maintain a healthy weight Body mass index (BMI) is used to identify weight problems. It estimates body fat based on height and weight. Your health care provider can help determine your BMI and help you achieve or maintain a healthy weight. Get regular exercise Get regular exercise. This is one of the most important things you can do for your health. Most adults should:  Exercise for at least 150 minutes each week. The exercise should increase your heart rate and make you sweat (moderate-intensity exercise).  Do strengthening exercises at least twice a week. This is in addition to the moderate-intensity exercise.  Spend less time sitting. Even light physical activity can be beneficial. Watch cholesterol and blood lipids Have your blood tested for lipids and cholesterol at 53 years of age, then have this test every 5 years. Have your cholesterol levels checked more often if:  Your lipid or cholesterol levels are high.  You are older than 53 years of age.  You are at high risk for heart disease. What should I know about cancer screening? Depending on your health history and family history, you may need to have cancer screening at various ages. This may include screening for:  Breast cancer.  Cervical cancer.  Colorectal cancer.  Skin cancer.  Lung cancer. What  should I know about heart disease, diabetes, and high blood pressure? Blood pressure and heart disease  High blood pressure causes heart disease and increases the risk of stroke. This is more likely to develop in people who have high blood pressure readings, are of African descent, or are overweight.  Have your blood pressure checked: ? Every 3-5 years if you are 65-71 years of age. ? Every year if you are 24 years old or older. Diabetes Have regular diabetes screenings. This checks your fasting blood sugar level. Have the screening done:  Once every three years after age 86 if you are at a normal weight and have a low risk for diabetes.  More often and at a younger age if you are overweight or have a high risk for diabetes. What should I know about preventing infection? Hepatitis B If you have a higher risk for hepatitis B, you should be screened for this virus. Talk with your health care provider to find out if you are at risk for hepatitis B infection. Hepatitis C Testing is recommended for:  Everyone born from [birth year] through 1965.  Anyone with known risk factors for hepatitis C. Sexually transmitted infections (STIs)  Get screened for STIs, including gonorrhea and chlamydia, if: ? You are sexually active and are younger than 53 years of age. ? You are older than 53 years of age and your health care provider tells you that you are at risk for this type of infection. ? Your sexual activity has changed since you were last screened, and you are at increased risk for chlamydia or gonorrhea. Ask your health care provider if you are at risk.  Ask your health care provider about whether you are at high risk for HIV. Your health care provider may recommend a prescription medicine to help prevent HIV infection. If you choose to take medicine to prevent HIV, you should first get tested for HIV. You should then be tested every 3 months for as long as you are taking the medicine. Pregnancy  If  you are about to stop having your period (premenopausal) and you may become pregnant, seek counseling before you get pregnant.  Take 400 to 800 micrograms (mcg) of folic acid every day if you become pregnant.  Ask for birth control (contraception) if you want to prevent pregnancy. Osteoporosis and menopause Osteoporosis is a disease in which the bones lose minerals and strength with aging. This can result in bone fractures. If you are 55 years old or older, or if you are at risk for osteoporosis and fractures, ask your health care provider if you should:  Be screened for bone loss.  Take a calcium or vitamin D supplement to lower your risk of fractures.  Be given hormone replacement therapy (HRT) to treat symptoms of menopause. Follow these instructions at home: Lifestyle  Do not use any products that contain nicotine or tobacco, such as cigarettes, e-cigarettes, and chewing tobacco. If you need help quitting, ask your health care provider.  Do not use street drugs.  Do not share needles.  Ask your health care provider for help if you need support or information about quitting drugs. Alcohol use  Do not drink alcohol if: ? Your health care provider tells you not to drink. ? You are pregnant, may be pregnant, or are planning to become pregnant.  If you drink alcohol: ? Limit how much you use to 0-1 drink a day. ? Limit intake if you are breastfeeding.  Be aware of how much alcohol is in your drink. In the [place] one drink equals one 12 oz bottle of beer (355 mL), one 5 oz glass of wine (148 mL), or one 1 oz glass of hard liquor (44 mL). General instructions  Schedule regular health, dental, and eye exams.  Stay current with your vaccines.  Tell your health care provider if: ? You often feel depressed. ? You have ever been abused or do not feel safe at home. Summary  Adopting a healthy lifestyle and getting preventive care are important in promoting health and wellness.   Follow your health care provider's instructions about healthy diet, exercising, and getting tested or screened for diseases.  Follow your health care provider's instructions on monitoring your cholesterol and blood pressure. This information is not intended to replace advice given to you by your health care provider. Make sure you discuss any questions you have with your health care provider. Document Released: 06/14/2011 Document Revised: 11/22/2018 Document Reviewed: 11/22/2018 Elsevier Patient Education  2020 [place] Trinten Boudoin M.D.

## 2019-10-22 ENCOUNTER — Ambulatory Visit (INDEPENDENT_AMBULATORY_CARE_PROVIDER_SITE_OTHER): Payer: BC Managed Care – PPO | Admitting: Internal Medicine

## 2019-10-22 ENCOUNTER — Other Ambulatory Visit: Payer: Self-pay

## 2019-10-22 ENCOUNTER — Encounter: Payer: Self-pay | Admitting: Internal Medicine

## 2019-10-22 VITALS — BP 128/80 | HR 86 | Temp 97.8°F | Ht 68.0 in | Wt 275.2 lb

## 2019-10-22 DIAGNOSIS — Z0000 Encounter for general adult medical examination without abnormal findings: Secondary | ICD-10-CM | POA: Diagnosis not present

## 2019-10-22 DIAGNOSIS — Z9884 Bariatric surgery status: Secondary | ICD-10-CM

## 2019-10-22 DIAGNOSIS — I10 Essential (primary) hypertension: Secondary | ICD-10-CM | POA: Diagnosis not present

## 2019-10-22 DIAGNOSIS — D509 Iron deficiency anemia, unspecified: Secondary | ICD-10-CM | POA: Diagnosis not present

## 2019-10-22 DIAGNOSIS — D649 Anemia, unspecified: Secondary | ICD-10-CM

## 2019-10-22 DIAGNOSIS — Z79899 Other long term (current) drug therapy: Secondary | ICD-10-CM | POA: Diagnosis not present

## 2019-10-22 DIAGNOSIS — E559 Vitamin D deficiency, unspecified: Secondary | ICD-10-CM

## 2019-10-22 DIAGNOSIS — E538 Deficiency of other specified B group vitamins: Secondary | ICD-10-CM

## 2019-10-22 DIAGNOSIS — R635 Abnormal weight gain: Secondary | ICD-10-CM

## 2019-10-22 LAB — BASIC METABOLIC PANEL
BUN: 16 mg/dL (ref 6–23)
CO2: 28 mEq/L (ref 19–32)
Calcium: 8.9 mg/dL (ref 8.4–10.5)
Chloride: 104 mEq/L (ref 96–112)
Creatinine, Ser: 1.02 mg/dL (ref 0.40–1.20)
GFR: 68.43 mL/min (ref >60.00)
Glucose, Bld: 94 mg/dL (ref 70–99)
Potassium: 4.5 mEq/L (ref 3.5–5.1)
Sodium: 139 mEq/L (ref 135–145)

## 2019-10-22 LAB — IBC + FERRITIN
Ferritin: 9.3 ng/mL — ABNORMAL LOW (ref 10.0–291.0)
Iron: 86 ug/dL (ref 42–145)
Saturation Ratios: 21.3 % (ref 20.0–50.0)
Transferrin: 289 mg/dL (ref 212.0–360.0)

## 2019-10-22 LAB — CBC WITH DIFFERENTIAL/PLATELET
Basophils Absolute: 0 10*3/uL (ref 0.0–0.1)
Basophils Relative: 0.4 % (ref 0.0–3.0)
Eosinophils Absolute: 0.2 10*3/uL (ref 0.0–0.7)
Eosinophils Relative: 3.8 % (ref 0.0–5.0)
HCT: 35.8 % — ABNORMAL LOW (ref 36.0–46.0)
Hemoglobin: 11.8 g/dL — ABNORMAL LOW (ref 12.0–15.0)
Lymphocytes Relative: 49.2 % — ABNORMAL HIGH (ref 12.0–46.0)
Lymphs Abs: 2.1 10*3/uL (ref 0.7–4.0)
MCHC: 32.9 g/dL (ref 30.0–36.0)
MCV: 88.6 fl (ref 78.0–100.0)
Monocytes Absolute: 0.3 10*3/uL (ref 0.1–1.0)
Monocytes Relative: 8.2 % (ref 3.0–12.0)
Neutro Abs: 1.6 10*3/uL (ref 1.4–7.7)
Neutrophils Relative %: 38.4 % — ABNORMAL LOW (ref 43.0–77.0)
Platelets: 230 10*3/uL (ref 150.0–400.0)
RBC: 4.04 Mil/uL (ref 3.87–5.11)
RDW: 15.2 % (ref 11.5–15.5)
WBC: 4.2 10*3/uL (ref 4.0–10.5)

## 2019-10-22 LAB — HEPATIC FUNCTION PANEL
ALT: 18 U/L (ref 0–35)
AST: 18 U/L (ref 0–37)
Albumin: 3.8 g/dL (ref 3.5–5.2)
Alkaline Phosphatase: 161 U/L — ABNORMAL HIGH (ref 39–117)
Bilirubin, Direct: 0.1 mg/dL (ref 0.0–0.3)
Total Bilirubin: 0.7 mg/dL (ref 0.2–1.2)
Total Protein: 6.5 g/dL (ref 6.0–8.3)

## 2019-10-22 LAB — LIPID PANEL
Cholesterol: 173 mg/dL (ref 0–200)
HDL: 51.6 mg/dL (ref >39.00)
LDL Cholesterol: 109 mg/dL — ABNORMAL HIGH (ref 0–99)
NonHDL: 121.19
Total CHOL/HDL Ratio: 3
Triglycerides: 63 mg/dL (ref 0.0–149.0)
VLDL: 12.6 mg/dL (ref 0.0–40.0)

## 2019-10-22 LAB — VITAMIN B12: Vitamin B-12: 1335 pg/mL — ABNORMAL HIGH (ref 211–911)

## 2019-10-22 LAB — HEMOGLOBIN A1C: Hgb A1c MFr Bld: 5.7 % (ref 4.6–6.5)

## 2019-10-22 LAB — TSH: TSH: 1.01 u[IU]/mL (ref 0.35–4.50)

## 2019-10-22 LAB — T4, FREE: Free T4: 0.67 ng/dL (ref 0.60–1.60)

## 2019-10-22 LAB — VITAMIN D 25 HYDROXY (VIT D DEFICIENCY, FRACTURES): VITD: 13.23 ng/mL — ABNORMAL LOW (ref 30.00–100.00)

## 2019-10-22 NOTE — Patient Instructions (Signed)
Lab today .  Please get back on track and track  All food drink and sleep to  help over the next 2 weeks and more consider getting a bariatric  Vitamin  In addition to  Your current vitamins .   Avoid   Simple carbohydrates  And sugars to help keep from getting diabetes and have  Sugars swim.   YOU NEED TO GET YOUR COLONSCOPY!.    How can I help you get this done?     We can do a stool card in the interim .  Also     Health Maintenance, Female Adopting a healthy lifestyle and getting preventive care are important in promoting health and wellness. Ask your health care provider about:  The right schedule for you to have regular tests and exams.  Things you can do on your own to prevent diseases and keep yourself healthy. What should I know about diet, weight, and exercise? Eat a healthy diet   Eat a diet that includes plenty of vegetables, fruits, low-fat dairy products, and lean protein.  Do not eat a lot of foods that are high in solid fats, added sugars, or sodium. Maintain a healthy weight Body mass index (BMI) is used to identify weight problems. It estimates body fat based on height and weight. Your health care provider can help determine your BMI and help you achieve or maintain a healthy weight. Get regular exercise Get regular exercise. This is one of the most important things you can do for your health. Most adults should:  Exercise for at least 150 minutes each week. The exercise should increase your heart rate and make you sweat (moderate-intensity exercise).  Do strengthening exercises at least twice a week. This is in addition to the moderate-intensity exercise.  Spend less time sitting. Even light physical activity can be beneficial. Watch cholesterol and blood lipids Have your blood tested for lipids and cholesterol at 53 years of age, then have this test every 5 years. Have your cholesterol levels checked more often if:  Your lipid or cholesterol levels are high.   You are older than 53 years of age.  You are at high risk for heart disease. What should I know about cancer screening? Depending on your health history and family history, you may need to have cancer screening at various ages. This may include screening for:  Breast cancer.  Cervical cancer.  Colorectal cancer.  Skin cancer.  Lung cancer. What should I know about heart disease, diabetes, and high blood pressure? Blood pressure and heart disease  High blood pressure causes heart disease and increases the risk of stroke. This is more likely to develop in people who have high blood pressure readings, are of African descent, or are overweight.  Have your blood pressure checked: ? Every 3-5 years if you are 56-40 years of age. ? Every year if you are 38 years old or older. Diabetes Have regular diabetes screenings. This checks your fasting blood sugar level. Have the screening done:  Once every three years after age 36 if you are at a normal weight and have a low risk for diabetes.  More often and at a younger age if you are overweight or have a high risk for diabetes. What should I know about preventing infection? Hepatitis B If you have a higher risk for hepatitis B, you should be screened for this virus. Talk with your health care provider to find out if you are at risk for hepatitis B infection.  Hepatitis C Testing is recommended for:  Everyone born from [birth year] through 1965.  Anyone with known risk factors for hepatitis C. Sexually transmitted infections (STIs)  Get screened for STIs, including gonorrhea and chlamydia, if: ? You are sexually active and are younger than 53 years of age. ? You are older than 53 years of age and your health care provider tells you that you are at risk for this type of infection. ? Your sexual activity has changed since you were last screened, and you are at increased risk for chlamydia or gonorrhea. Ask your health care provider if you are at  risk.  Ask your health care provider about whether you are at high risk for HIV. Your health care provider may recommend a prescription medicine to help prevent HIV infection. If you choose to take medicine to prevent HIV, you should first get tested for HIV. You should then be tested every 3 months for as long as you are taking the medicine. Pregnancy  If you are about to stop having your period (premenopausal) and you may become pregnant, seek counseling before you get pregnant.  Take 400 to 800 micrograms (mcg) of folic acid every day if you become pregnant.  Ask for birth control (contraception) if you want to prevent pregnancy. Osteoporosis and menopause Osteoporosis is a disease in which the bones lose minerals and strength with aging. This can result in bone fractures. If you are 88 years old or older, or if you are at risk for osteoporosis and fractures, ask your health care provider if you should:  Be screened for bone loss.  Take a calcium or vitamin D supplement to lower your risk of fractures.  Be given hormone replacement therapy (HRT) to treat symptoms of menopause. Follow these instructions at home: Lifestyle  Do not use any products that contain nicotine or tobacco, such as cigarettes, e-cigarettes, and chewing tobacco. If you need help quitting, ask your health care provider.  Do not use street drugs.  Do not share needles.  Ask your health care provider for help if you need support or information about quitting drugs. Alcohol use  Do not drink alcohol if: ? Your health care provider tells you not to drink. ? You are pregnant, may be pregnant, or are planning to become pregnant.  If you drink alcohol: ? Limit how much you use to 0-1 drink a day. ? Limit intake if you are breastfeeding.  Be aware of how much alcohol is in your drink. In the [place] one drink equals one 12 oz bottle of beer (355 mL), one 5 oz glass of wine (148 mL), or one 1 oz glass of hard liquor  (44 mL). General instructions  Schedule regular health, dental, and eye exams.  Stay current with your vaccines.  Tell your health care provider if: ? You often feel depressed. ? You have ever been abused or do not feel safe at home. Summary  Adopting a healthy lifestyle and getting preventive care are important in promoting health and wellness.  Follow your health care provider's instructions about healthy diet, exercising, and getting tested or screened for diseases.  Follow your health care provider's instructions on monitoring your cholesterol and blood pressure. This information is not intended to replace advice given to you by your health care provider. Make sure you discuss any questions you have with your health care provider. Document Released: 06/14/2011 Document Revised: 11/22/2018 Document Reviewed: 11/22/2018 Elsevier Patient Education  2020 [employer].

## 2019-10-23 ENCOUNTER — Other Ambulatory Visit: Payer: Self-pay

## 2019-10-23 DIAGNOSIS — R945 Abnormal results of liver function studies: Secondary | ICD-10-CM

## 2019-10-23 DIAGNOSIS — E559 Vitamin D deficiency, unspecified: Secondary | ICD-10-CM

## 2019-10-23 DIAGNOSIS — R748 Abnormal levels of other serum enzymes: Secondary | ICD-10-CM

## 2019-10-23 DIAGNOSIS — E041 Nontoxic single thyroid nodule: Secondary | ICD-10-CM

## 2019-10-23 MED ORDER — VITAMIN D (ERGOCALCIFEROL) 1.25 MG (50000 UNIT) PO CAPS: 50000.0000 [IU] | ORAL_CAPSULE | ORAL | 0 refills | Status: DC

## 2019-11-16 ENCOUNTER — Ambulatory Visit
Admission: RE | Admit: 2019-11-16 | Discharge: 2019-11-16 | Disposition: A | Discharge status: Home / Self Care | Payer: BC Managed Care – PPO | Source: Ambulatory Visit | Attending: Internal Medicine | Admitting: Internal Medicine

## 2019-11-16 DIAGNOSIS — R748 Abnormal levels of other serum enzymes: Secondary | ICD-10-CM

## 2019-11-16 DIAGNOSIS — E041 Nontoxic single thyroid nodule: Secondary | ICD-10-CM

## 2019-11-16 DIAGNOSIS — R945 Abnormal results of liver function studies: Secondary | ICD-10-CM

## 2019-11-20 DIAGNOSIS — E041 Nontoxic single thyroid nodule: Secondary | ICD-10-CM

## 2019-11-20 DIAGNOSIS — R748 Abnormal levels of other serum enzymes: Secondary | ICD-10-CM

## 2019-11-20 DIAGNOSIS — Z1211 Encounter for screening for malignant neoplasm of colon: Secondary | ICD-10-CM

## 2019-11-20 NOTE — Addendum Note (Signed)
Addended by: Modena Morrow R on: 11/20/2019 02:35 PM   Modules accepted: Orders

## 2019-11-27 ENCOUNTER — Encounter: Payer: Self-pay | Admitting: Internal Medicine

## 2019-11-27 ENCOUNTER — Encounter: Payer: Self-pay | Admitting: Gastroenterology

## 2019-11-27 ENCOUNTER — Other Ambulatory Visit: Payer: Self-pay

## 2019-11-27 ENCOUNTER — Ambulatory Visit (INDEPENDENT_AMBULATORY_CARE_PROVIDER_SITE_OTHER): Payer: BC Managed Care – PPO | Admitting: Internal Medicine

## 2019-11-27 VITALS — BP 124/78 | HR 83 | Temp 98.5°F | Ht 68.0 in | Wt 278.2 lb

## 2019-11-27 DIAGNOSIS — E042 Nontoxic multinodular goiter: Secondary | ICD-10-CM | POA: Diagnosis not present

## 2019-11-27 DIAGNOSIS — R748 Abnormal levels of other serum enzymes: Secondary | ICD-10-CM

## 2019-11-27 NOTE — Progress Notes (Signed)
Name: Brooke Le  MRN/ DOB: [id number], 1966-03-03    Age/ Sex: 53 y.o., female    PCP: Burnis Medin, MD   Reason for Endocrinology Evaluation: MNG/ Elevated Alk. Phos     Date of Initial Endocrinology Evaluation: 11/27/2019     HPI: [name] is a 53 y.o. female with a past medical history of HTN and gastric bypass. The patient presented for initial endocrinology clinic visit on 11/27/2019 for consultative assistance with her MNG   Pt was diagnosed with MNG in 11/2019 based on ultrasound results. Unclear to me what prompted the ultrasound.   She denies neck pain  but has occasional dysphagia and hoarseness.  No prior exposure to radiation.  Weight has been fluctuating. Denies Cough, sob or chest pain    Pt was noted to have elevated alkaline phosphatase since 2009, this has been fluctuating in nature   She denies any recent bone fracture . No diagnosis of osteoporosis or osteopenia Menopause a year ago    Maternal aunt with thyroid disease.   HISTORY:  Past Medical History:  Past Medical History:  Diagnosis Date  . Allergic rhinitis   . Anemia, iron deficiency   . Fibromyalgia   . Gynecological examination    Dr. Nori Riis  . JQGBEEFE(071.2) 08/21/2007   Qualifier: Diagnosis of  By: Regis Bill MD, Standley Brooking   . Hypertension    had decreased with weight loss  . Low vitamin B12 level   . Migraine headache    hx of seen in [facility] in the remote past  . Obesity   . 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 Syncope    ER evaluation in 2008; had anemia then   Past Surgical History:  Past Surgical History:  Procedure Laterality Date  . ADENOIDECTOMY    . ESOPHAGOGASTRODUODENOSCOPY  10/31/2002  . GASTRIC BYPASS    . TUBAL LIGATION        Social History:  reports that she has never smoked. She has never used smokeless tobacco.  Family History: family history includes Alcohol abuse  in an other family member; Diabetes in an other family member; Hypertension in an other family member.   HOME MEDICATIONS: Allergies as of 11/27/2019      Reactions   Bee Pollen Swelling      Medication List       Accurate as of November 27, 2019 10:45 AM. If you have any questions, ask your nurse or doctor.        amitriptyline 10 MG tablet Commonly known as: ELAVIL TAKE 3 TABLETS (30 MG TOTAL) BY MOUTH AT BEDTIME.   Black Elderberry(Berry-Flower) 575 MG Caps Take by mouth.   hydrochlorothiazide 25 MG tablet Commonly known as: HYDRODIURIL TAKE 1 TABLET BY MOUTH EVERY DAY   verapamil 240 MG CR tablet Commonly known as: CALAN-SR TAKE 1 TABLET (240 MG TOTAL) BY MOUTH AT BEDTIME.   vitamin B-12 1000 MCG tablet Commonly known as: CYANOCOBALAMIN Take 1,000 mcg by mouth daily.   Vitamin D (Ergocalciferol) 1.25 MG (50000 UT) Caps capsule Commonly known as: DRISDOL Take 1 capsule (50,000 Units total) by mouth every 7 (seven) days.         REVIEW OF SYSTEMS: A comprehensive ROS was conducted with the patient and is negative except as per HPI and below:  Review of Systems  Constitutional: Negative for chills and fever.  HENT: Negative for congestion  and sore throat.   Eyes: Negative for pain and discharge.  Respiratory: Negative for cough and shortness of breath.   Cardiovascular: Negative for chest pain and palpitations.  Gastrointestinal: Negative for constipation and diarrhea.  Genitourinary: Negative for frequency.  Musculoskeletal: Positive for joint pain.  Endo/Heme/Allergies: Negative for polydipsia.  Psychiatric/Behavioral: Positive for depression. The patient is not nervous/anxious.        OBJECTIVE:  VS: BP 124/78 (BP Location: Left Arm, Patient Position: Sitting, Cuff Size: Large)   Pulse 83   Temp 98.5 F (36.9 C)   Ht _0  (1.727 m)   Wt 278 lb 3.2 oz (126.2 kg)   SpO2 99%   BMI 42.30 kg/m    Wt Readings from Last 3 Encounters:  11/27/19 278  lb 3.2 oz (126.2 kg)  10/22/19 275 lb 3.2 oz (124.8 kg)  10/18/18 237 lb 6.4 oz (107.7 kg)     EXAM: General: Pt appears well and is in NAD  Hydration: Well-hydrated with moist mucous membranes and good skin turgor  Eyes: External eye exam normal without stare, lid lag or exophthalmos.  EOM intact.    Neck: General: Supple without adenopathy. Thyroid: Thyroid size enlarged.   Lungs: Clear with good BS bilat with no rales, rhonchi, or wheezes  Heart: Auscultation: RRR.  Abdomen: Normoactive bowel sounds, soft, nontender, without masses or organomegaly palpable  Extremities:  BL LE: No pretibial edema normal ROM and strength.  Skin: Hair: Texture and amount normal with gender appropriate distribution Skin Inspection: No rashes Skin Palpation: Skin temperature, texture, and thickness normal to palpation  Neuro: Cranial nerves: II - XII grossly intact  Motor: Normal strength throughout DTRs: 2+ and symmetric in UE without delay in relaxation phase  Mental Status: Judgment, insight: Intact Orientation: Oriented to time, place, and person Mood and affect: No depression, anxiety, or agitation     DATA REVIEWED:  Results for [name], CANALE (MRN [id number]) as of 11/27/2019 10:37  Ref. Range 10/22/2019 14:12  TSH Latest Ref Range: 0.35 - 4.50 uIU/mL 1.01  T4,Free(Direct) Latest Ref Range: 0.60 - 1.60 ng/dL 0.67    Thyroid Ultrasound 11/16/2019  Estimated total number of nodules >/= 1 cm: 2  Number of spongiform nodules >/=  2 cm not described below (TR1): 0  Number of mixed cystic and solid nodules >/= 1.5 cm not described below ([place]): 0  _________________________________________________________  0.8 cm hypoechoic nodule without calcifications, mid right; This nodule does NOT meet TI-RADS criteria for biopsy or dedicated follow-up.  Nodule # 2:  Location: Right; Inferior  Maximum size: 1 cm; Other 2 dimensions: 0.8 x 0.7 cm  Composition: solid/almost  completely solid (2)  Echogenicity: isoechoic (1)  Shape: not taller-than-wide (0)  Margins: ill-defined (0)  Echogenic foci: none (0)  ACR TI-RADS total points: 3.  ACR TI-RADS risk category: TR3 (3 points).  ACR TI-RADS recommendations:  Given size (<1.4 cm) and appearance, this nodule does NOT meet TI-RADS criteria for biopsy or dedicated follow-up.  _________________________________________________________  Nodule # 2:  Location: Left; Inferior  Maximum size: 2.9 cm; Other 2 dimensions: 2.9 x 2.2 cm  Composition: solid/almost completely solid (2)  Echogenicity: hyperechoic (1)  Shape: not taller-than-wide (0)  Margins: ill-defined (0)  Echogenic foci: none (0)  ACR TI-RADS total points: 3.  ACR TI-RADS risk category: TR3 (3 points).  ACR TI-RADS recommendations:  **Given size (>/= 2.5 cm) and appearance, fine needle aspiration of this mildly suspicious nodule should be considered based on TI-RADS criteria.  IMPRESSION: 1. Borderline thyromegaly with bilateral nodules. 2. Recommend FNA biopsy of 2.9 cm inferior left mildly suspicious nodule.  ASSESSMENT/PLAN/RECOMMENDATIONS:   1. MNG:  - Pt with minimal local neck symptoms that could or could not be related to thyroid nodules - Pt agreed to proceed with FNA of the left inferior nodule based on the ACR TI-RAD criteria  - Bio chemically and clinically euthyroid   2. Elevated Alkaline Phosphatase:    - Elevated Alkaline phosphatase could be of hepatic, intestinal or bone origin. - Elevated Alkaline phosphatase of bone origin is due to increase osteoblastic activity.  - Causes include : hyperthyroidism, hyperparathyroidism, osteomalacia , recent fractures, paget's disease or familial causes.  - Will start with Alk. Phos isozymes first    3. Vitamin D Deficiency :   - This is being replenished by PCP   F/U in 1 yr or sooner pending Alk. Phos results.   Signed electronically  by: Mack Guise, MD  [facility] Endocrinology  [facility] [address], [place], [place] [address] Phone: [phone] FAX: [phone]   CC: Burnis Medin, [place] [address] Phone: [phone] Fax: [phone]   Return to Endocrinology clinic as below: Future Appointments  Date Time Provider [place]  12/19/2019  2:00 PM Panosh, Standley Brooking, MD [id number]

## 2019-11-27 NOTE — Patient Instructions (Signed)
-   We have put in orders for a left  thyroid biopsy to be done at [facility]  - Please stop by the lab today to determine if the elevated alkaline phosphatase is related to bone or digestive system.

## 2019-12-01 LAB — ALKALINE PHOSPHATASE, ISOENZYMES
Alkaline Phosphatase: 189 IU/L — ABNORMAL HIGH (ref 39–117)
BONE FRACTION: 25 % (ref 14–68)
INTESTINAL FRAC.: 3 % (ref 0–18)
LIVER FRACTION: 72 % (ref 18–85)

## 2019-12-03 ENCOUNTER — Telehealth: Payer: Self-pay | Admitting: Internal Medicine

## 2019-12-03 ENCOUNTER — Encounter: Payer: Self-pay | Admitting: Gastroenterology

## 2019-12-03 ENCOUNTER — Ambulatory Visit (AMBULATORY_SURGERY_CENTER): Payer: BC Managed Care – PPO | Admitting: *Deleted

## 2019-12-03 ENCOUNTER — Other Ambulatory Visit: Payer: Self-pay

## 2019-12-03 VITALS — Temp 96.8°F | Ht 68.0 in | Wt 279.0 lb

## 2019-12-03 DIAGNOSIS — R748 Abnormal levels of other serum enzymes: Secondary | ICD-10-CM

## 2019-12-03 DIAGNOSIS — Z1211 Encounter for screening for malignant neoplasm of colon: Secondary | ICD-10-CM

## 2019-12-03 DIAGNOSIS — Z1159 Encounter for screening for other viral diseases: Secondary | ICD-10-CM

## 2019-12-03 MED ORDER — SUPREP BOWEL PREP KIT 17.5-3.13-1.6 GM/177ML PO SOLN: 1.0000 | Freq: Once | ORAL | 0 refills | Status: AC

## 2019-12-03 NOTE — Telephone Encounter (Signed)
Attempted to call the patient but no answer. I left her a message to check the portal.    Her Alk. Phos isozymes are normal but the bulk is digestive, which is beyond the scope of endocrinology.   I will go ahead and order a DXA       Abby Nena Jordan, MD  [facility] Endocrinology  [facility] [address] [place], [place] [address] Phone: [phone] FAX: [phone]

## 2019-12-03 NOTE — Progress Notes (Signed)
No egg or soy allergy known to patient  No issues with past sedation with any surgeries  or procedures, no intubation problems  No diet pills per patient No home 02 use per patient  No blood thinners per patient  Pt denies issues with constipation  No A fib or A flutter  EMMI video sent to pt's e mail   Due to the COVID-19 pandemic we are asking patients to follow these guidelines. Please only bring one care partner. Please be aware that your care partner may wait in the car in the parking lot or if they feel like they will be too hot to wait in the car, they may wait in the lobby on the 4th floor. All care partners are required to wear a mask the entire time (we do not have any that we can provide them), they need to practice social distancing, and we will do a Covid check for all patient's and care partners when you arrive. Also we will check their temperature and your temperature. If the care partner waits in their car they need to stay in the parking lot the entire time and we will call them on their cell phone when the patient is ready for discharge so they can bring the car to the front of the building. Also all patient's will need to wear a mask into building. Suprep $15 coupon  

## 2019-12-04 NOTE — Telephone Encounter (Signed)
Pt scheduled 12/10/19 @9  a.m. lft a detailed message for pt with # to call if she needs to reschedule.

## 2019-12-10 ENCOUNTER — Ambulatory Visit (INDEPENDENT_AMBULATORY_CARE_PROVIDER_SITE_OTHER)
Admission: RE | Admit: 2019-12-10 | Discharge: 2019-12-10 | Disposition: A | Discharge status: Home / Self Care | Payer: BC Managed Care – PPO | Source: Ambulatory Visit | Attending: Internal Medicine | Admitting: Internal Medicine

## 2019-12-10 ENCOUNTER — Other Ambulatory Visit: Payer: Self-pay

## 2019-12-10 DIAGNOSIS — R748 Abnormal levels of other serum enzymes: Secondary | ICD-10-CM

## 2019-12-11 DIAGNOSIS — R748 Abnormal levels of other serum enzymes: Secondary | ICD-10-CM | POA: Diagnosis not present

## 2019-12-12 ENCOUNTER — Encounter: Payer: Self-pay | Admitting: Internal Medicine

## 2019-12-17 ENCOUNTER — Encounter: Payer: BC Managed Care – PPO | Admitting: Gastroenterology

## 2019-12-18 ENCOUNTER — Other Ambulatory Visit: Payer: Self-pay

## 2019-12-19 ENCOUNTER — Encounter: Payer: Self-pay | Admitting: Internal Medicine

## 2019-12-19 ENCOUNTER — Ambulatory Visit (INDEPENDENT_AMBULATORY_CARE_PROVIDER_SITE_OTHER): Payer: BC Managed Care – PPO | Admitting: Internal Medicine

## 2019-12-19 ENCOUNTER — Ambulatory Visit
Admission: RE | Admit: 2019-12-19 | Discharge: 2019-12-19 | Disposition: A | Discharge status: Home / Self Care | Payer: BC Managed Care – PPO | Source: Ambulatory Visit | Attending: Internal Medicine | Admitting: Internal Medicine

## 2019-12-19 ENCOUNTER — Other Ambulatory Visit (HOSPITAL_COMMUNITY)
Admission: RE | Admit: 2019-12-19 | Discharge: 2019-12-19 | Disposition: A | Discharge status: Home / Self Care | Payer: BC Managed Care – PPO | Source: Ambulatory Visit | Attending: Physician Assistant | Admitting: Physician Assistant

## 2019-12-19 VITALS — BP 124/62 | HR 82 | Temp 97.8°F | Ht 68.0 in | Wt 278.0 lb

## 2019-12-19 DIAGNOSIS — E559 Vitamin D deficiency, unspecified: Secondary | ICD-10-CM

## 2019-12-19 DIAGNOSIS — D34 Benign neoplasm of thyroid gland: Secondary | ICD-10-CM | POA: Diagnosis not present

## 2019-12-19 DIAGNOSIS — D1803 Hemangioma of intra-abdominal structures: Secondary | ICD-10-CM

## 2019-12-19 DIAGNOSIS — Z9884 Bariatric surgery status: Secondary | ICD-10-CM

## 2019-12-19 DIAGNOSIS — E611 Iron deficiency: Secondary | ICD-10-CM | POA: Diagnosis not present

## 2019-12-19 DIAGNOSIS — E042 Nontoxic multinodular goiter: Secondary | ICD-10-CM

## 2019-12-19 DIAGNOSIS — E041 Nontoxic single thyroid nodule: Secondary | ICD-10-CM | POA: Diagnosis present

## 2019-12-19 DIAGNOSIS — R748 Abnormal levels of other serum enzymes: Secondary | ICD-10-CM | POA: Diagnosis not present

## 2019-12-19 LAB — CBC WITH DIFFERENTIAL/PLATELET
Basophils Absolute: 0 10*3/uL (ref 0.0–0.1)
Basophils Relative: 0.3 % (ref 0.0–3.0)
Eosinophils Absolute: 0.1 10*3/uL (ref 0.0–0.7)
Eosinophils Relative: 2.8 % (ref 0.0–5.0)
HCT: 38 % (ref 36.0–46.0)
Hemoglobin: 12.7 g/dL (ref 12.0–15.0)
Lymphocytes Relative: 52.5 % — ABNORMAL HIGH (ref 12.0–46.0)
Lymphs Abs: 2.6 10*3/uL (ref 0.7–4.0)
MCHC: 33.5 g/dL (ref 30.0–36.0)
MCV: 87.7 fl (ref 78.0–100.0)
Monocytes Absolute: 0.4 10*3/uL (ref 0.1–1.0)
Monocytes Relative: 8 % (ref 3.0–12.0)
Neutro Abs: 1.8 10*3/uL (ref 1.4–7.7)
Neutrophils Relative %: 36.4 % — ABNORMAL LOW (ref 43.0–77.0)
Platelets: 271 10*3/uL (ref 150.0–400.0)
RBC: 4.33 Mil/uL (ref 3.87–5.11)
RDW: 13.9 % (ref 11.5–15.5)
WBC: 5.1 10*3/uL (ref 4.0–10.5)

## 2019-12-19 LAB — IBC + FERRITIN
Ferritin: 8.7 ng/mL — ABNORMAL LOW (ref 10.0–291.0)
Iron: 97 ug/dL (ref 42–145)
Saturation Ratios: 23.1 % (ref 20.0–50.0)
Transferrin: 300 mg/dL (ref 212.0–360.0)

## 2019-12-19 LAB — ALKALINE PHOSPHATASE: Alkaline Phosphatase: 174 U/L — ABNORMAL HIGH (ref 39–117)

## 2019-12-19 LAB — VITAMIN D 25 HYDROXY (VIT D DEFICIENCY, FRACTURES): VITD: 33.94 ng/mL (ref 30.00–100.00)

## 2019-12-19 LAB — GAMMA GT: GGT: 9 U/L (ref 7–51)

## 2019-12-19 MED ORDER — AMITRIPTYLINE HCL 10 MG PO TABS: 20.0000 mg | ORAL_TABLET | Freq: Every day | ORAL | 2 refills | Status: DC

## 2019-12-19 NOTE — Procedures (Signed)
PROCEDURE SUMMARY:  Using direct ultrasound guidance, 5 passes were made using 25 g needles into the nodule within the left lobe of the thyroid.   Ultrasound was used to confirm needle placements on all occasions.   EBL = trace  Specimens were sent to Pathology for analysis.  See procedure note under Imaging tab in Epic for full procedure details.  Khalik Pewitt S Insiya Oshea PA-C 12/19/2019 11:03 AM

## 2019-12-19 NOTE — Patient Instructions (Signed)
Lab today   To check  Vit d  Etc .   Continue  Attention    To  healthy eating .  To not let the weight go back up. Great Blood pressure   Today![name]

## 2019-12-19 NOTE — Progress Notes (Signed)
This visit occurred during the SARS-CoV-2 public health emergency.  Safety protocols were in place, including screening questions prior to the visit, additional usage of staff PPE, and extensive cleaning of exam room while observing appropriate contact time as indicated for disinfecting solutions.    Chief Complaint  Patient presents with  . colonscopy    pt wants to discuss colonoscopy    HPI: Brooke Le 54 y.o. come in for a number of issues management . Had  Thyroid  bx  .    Today   And  Gastro feb 4th  For colonscopy  Endo feels alk phos could be from liver and not bone but  Vit d has been low and on  High dose weekly without problem  BP : Has been taking   Med regularly and readings are much better  At goal  Taking otc vits  And  Extra D  Without problem .  ROS: See pertinent positives and negatives per HPI. No cp sob  Has r knee pain some  Poss from prev injury  No fall lots of stress with covid family job ( works [employer])  Past Medical History:  Diagnosis Date  . Allergic rhinitis   . Allergy   . Anemia, iron deficiency   . Fibromyalgia   . Gynecological examination    Brooke Le  . ZOXWRUEA(540.9) 08/21/2007   Qualifier: Diagnosis of  By: Brooke Le   . Hypertension    had decreased with weight loss  . Low vitamin B12 level   . Migraine headache    hx of seen in [facility] in the remote past  . Obesity   . 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 Syncope    ER evaluation in 2008; had anemia then    Family History  Problem Relation Age of Onset  . Alcohol abuse Other   . Diabetes Other   . Hypertension Other   . Prostate cancer Father   . Esophageal cancer Maternal Aunt   . Colon cancer Maternal Uncle   . Colon cancer Cousin   . Colon cancer Other   . Rectal cancer Neg Hx   . Stomach cancer Neg Hx       Outpatient Medications Prior to Visit  Medication Sig Dispense Refill  .  Black Elderberry,Berry-Flower, 575 MG CAPS Take by mouth.    . hydrochlorothiazide (HYDRODIURIL) 25 MG tablet TAKE 1 TABLET BY MOUTH EVERY DAY 30 tablet 0  . ibuprofen (ADVIL) 800 MG tablet     . verapamil (CALAN-SR) 240 MG CR tablet TAKE 1 TABLET (240 MG TOTAL) BY MOUTH AT BEDTIME. 90 tablet 1  . vitamin B-12 (CYANOCOBALAMIN) 1000 MCG tablet Take 1,000 mcg by mouth daily.    . Vitamin D, Ergocalciferol, (DRISDOL) 1.25 MG (50000 UT) CAPS capsule Take 1 capsule (50,000 Units total) by mouth every 7 (seven) days. 12 capsule 0  . amitriptyline (ELAVIL) 10 MG tablet TAKE 3 TABLETS (30 MG TOTAL) BY MOUTH AT BEDTIME. 270 tablet 1   No facility-administered medications prior to visit.     EXAM:  BP 124/62 (BP Location: Right Arm, Patient Position: Sitting, Cuff Size: Normal)   Pulse 82   Temp 97.8 F (36.6 C) (Temporal)   Ht 5' 8" (1.727 m)   Wt 278 lb (126.1 kg)   LMP 10/04/2018 (Approximate)   SpO2 96%   BMI 42.27 kg/m   Body mass  index is 42.27 kg/m.  GENERAL: vitals reviewed and listed above, alert, oriented, appears well hydrated and in no acute distress HEENT: atraumatic, conjunctiva  clear, no obvious abnormalities on inspection of external nose and ears OP : masked NECK: no obvious masses on inspection palpation  LUNGS: clear to auscultation bilaterally, no wheezes, rales or rhonchi, good air movement CV: HRRR, no clubbing cyanosis or  peripheral edema nl cap refill  MS: moves all extremities without noticeable focal  abnormality PSYCH: pleasant and cooperative, no obvious depression or anxiety Lab Results  Component Value Date   WBC 5.1 12/19/2019   HGB 12.7 12/19/2019   HCT 38.0 12/19/2019   PLT 271.0 12/19/2019   GLUCOSE 94 10/22/2019   CHOL 173 10/22/2019   TRIG 63.0 10/22/2019   HDL 51.60 10/22/2019   LDLCALC 109 (H) 10/22/2019   ALT 18 10/22/2019   AST 18 10/22/2019   NA 139 10/22/2019   K 4.5 10/22/2019   CL 104 10/22/2019   CREATININE 1.02 10/22/2019   BUN  16 10/22/2019   CO2 28 10/22/2019   TSH 1.01 10/22/2019   HGBA1C 5.7 10/22/2019   BP Readings from Last 3 Encounters:  12/19/19 124/62  11/27/19 124/78  10/22/19 128/80   Wt Readings from Last 3 Encounters:  12/19/19 278 lb (126.1 kg)  12/03/19 279 lb (126.6 kg)  11/27/19 278 lb 3.2 oz (126.2 kg)     ASSESSMENT AND PLAN:  Discussed the following assessment and plan:  Elevated alkaline phosphatase level - Plan: Alkaline phosphatase, Gamma GT, Vitamin D 25 hydroxy, IBC + Ferritin Brooke Le, CBC with Differential  Vitamin D deficiency - Plan: Alkaline phosphatase, Gamma GT, Vitamin D 25 hydroxy, IBC + Ferritin Brooke Le, CBC with Differential  PSTPRC STATUS, BARIATRIC SURGERY - Plan: Alkaline phosphatase, Gamma GT, Vitamin D 25 hydroxy, IBC + Ferritin Brooke Le, CBC with Differential  Iron deficiency - Plan: Alkaline phosphatase, Gamma GT, Vitamin D 25 hydroxy, IBC + Ferritin Brooke Le, CBC with Differential  Hemangioma of liver - incidental finding Get updated vit d  And  Labs iron etc .    At this time cont vits and rx vit d  And go from there  Will share info with Gi as indicated and plan fu  As indicated .  definitely proceed with colon cancer screening  Uncertain how much of above is from poor absorption   But doing better . Working on [employer] control again  Disc  -Patient advised to return or notify health care team  if  new concerns arise.  Patient Instructions  Lab today   To check  Vit d  Etc .   Continue  Attention    To  healthy eating .  To not let the weight go back up. Great Blood pressure   Today!Brooke Le. Bailei Buist M.D.

## 2019-12-20 LAB — CYTOLOGY - NON PAP

## 2019-12-21 ENCOUNTER — Ambulatory Visit: Payer: BC Managed Care – PPO | Attending: Internal Medicine

## 2019-12-21 DIAGNOSIS — Z20822 Contact with and (suspected) exposure to covid-19: Secondary | ICD-10-CM

## 2019-12-22 LAB — NOVEL CORONAVIRUS, NAA: SARS-CoV-2, NAA: NOT DETECTED

## 2019-12-25 NOTE — Progress Notes (Signed)
Vit d is better stay on dosing of  vit d ( can ask for refill)  Anemia is better   Alk phos is about the same and will follow  and send info to GI Dr Loletha Carrow  Please make appt with me for 4-5 months

## 2019-12-31 ENCOUNTER — Encounter: Payer: Self-pay | Admitting: Internal Medicine

## 2019-12-31 ENCOUNTER — Telehealth: Payer: Self-pay | Admitting: Internal Medicine

## 2019-12-31 DIAGNOSIS — R748 Abnormal levels of other serum enzymes: Secondary | ICD-10-CM

## 2019-12-31 NOTE — Telephone Encounter (Signed)
-----  Message from Burnis Medin, MD sent at 12/25/2019 12:44 PM EST ----- Vit d is better stay on dosing of  vit d ( can ask for refill)  Anemia is better   Alk phos is about the same and will follow  and send info to GI Dr Loletha Carrow  Please make appt with me for 4-5 months

## 2019-12-31 NOTE — Telephone Encounter (Signed)
Based on the recent communication between Dr. Regis Bill and GI , and based on the recent normal heptic work up, I would like to proceed with a bone scan just to cover all causes for elevated Alk Phos from endocrinology stand  Point.    Attempted to call the patient on 12/31/19 @ 1315 with no answer. I left a message for the patient to check the portal.    A bone scan has been ordered.     Abby Nena Jordan, MD  [facility]  [facility] [address] [place], [place] [address] Phone: [phone] FAX: [phone]

## 2020-01-08 ENCOUNTER — Other Ambulatory Visit: Payer: Self-pay | Admitting: Internal Medicine

## 2020-01-10 ENCOUNTER — Telehealth: Payer: Self-pay | Admitting: Internal Medicine

## 2020-01-10 NOTE — Telephone Encounter (Signed)
This is not a bone density it is a bone scan, do we have someone here that is working on these types of orders or is this something that I need to work on myself?

## 2020-01-10 NOTE — Telephone Encounter (Signed)
Pt scheduled for 01/16/2020 @ 11 and 2 and pt aware of appointments

## 2020-01-10 NOTE — Telephone Encounter (Signed)
Not sure who is working on these, do I need to schedule this?

## 2020-01-10 NOTE — Telephone Encounter (Signed)
Yes Bone density is scheduled by the appropriate nurse

## 2020-01-10 NOTE — Telephone Encounter (Signed)
Patient requests to be called at ph# [phone] re: status of scheduling appointment for Bone Scan

## 2020-01-15 ENCOUNTER — Telehealth: Payer: Self-pay

## 2020-01-15 NOTE — Telephone Encounter (Signed)
Called pt regarding no show for COVID test for upcoming scheduled colonoscopy w/ Dr. Loletha Carrow.  No answer.  Left voice message requesting pt to call office back.

## 2020-01-16 ENCOUNTER — Encounter (HOSPITAL_COMMUNITY): Payer: BC Managed Care – PPO

## 2020-01-16 ENCOUNTER — Encounter (HOSPITAL_COMMUNITY): Admission: RE | Admit: 2020-01-16 | Payer: BC Managed Care – PPO | Source: Ambulatory Visit

## 2020-01-17 ENCOUNTER — Encounter: Payer: BC Managed Care – PPO | Admitting: Gastroenterology

## 2020-01-17 ENCOUNTER — Other Ambulatory Visit (HOSPITAL_COMMUNITY): Payer: BC Managed Care – PPO

## 2020-01-17 ENCOUNTER — Ambulatory Visit (HOSPITAL_COMMUNITY): Payer: BC Managed Care – PPO

## 2020-02-04 ENCOUNTER — Other Ambulatory Visit: Payer: Self-pay | Admitting: Internal Medicine

## 2020-02-05 ENCOUNTER — Ambulatory Visit (INDEPENDENT_AMBULATORY_CARE_PROVIDER_SITE_OTHER): Payer: BC Managed Care – PPO

## 2020-02-05 ENCOUNTER — Other Ambulatory Visit: Payer: Self-pay | Admitting: Gastroenterology

## 2020-02-05 DIAGNOSIS — Z1159 Encounter for screening for other viral diseases: Secondary | ICD-10-CM

## 2020-02-05 LAB — SARS CORONAVIRUS 2 (TAT 6-24 HRS): SARS Coronavirus 2: NEGATIVE

## 2020-02-07 ENCOUNTER — Encounter: Payer: Self-pay | Admitting: Gastroenterology

## 2020-02-07 ENCOUNTER — Other Ambulatory Visit: Payer: Self-pay

## 2020-02-07 ENCOUNTER — Ambulatory Visit: Payer: BC Managed Care – PPO | Admitting: Gastroenterology

## 2020-02-07 VITALS — BP 134/73 | HR 66 | Temp 97.5°F | Resp 21 | Ht 68.0 in | Wt 279.0 lb

## 2020-02-07 DIAGNOSIS — Z1211 Encounter for screening for malignant neoplasm of colon: Secondary | ICD-10-CM

## 2020-02-07 DIAGNOSIS — Z01818 Encounter for other preprocedural examination: Secondary | ICD-10-CM

## 2020-02-07 MED ORDER — SODIUM CHLORIDE 0.9 % IV SOLN: 500.0000 mL | Freq: Once | INTRAVENOUS | Status: DC

## 2020-02-07 NOTE — Progress Notes (Signed)
Pt given instructions for next colonoscopy on 02-14-20.  Covid test put in for 02-12-20 at 1:10

## 2020-02-07 NOTE — Progress Notes (Signed)
PT taken to PACU. Monitors in place. VSS. Report given to RN. 

## 2020-02-07 NOTE — Patient Instructions (Signed)
Reschedule for next Thursday March 4th at 3:30 pm   YOU HAD AN ENDOSCOPIC PROCEDURE TODAY AT [facility]:   Refer to the procedure report that was given to you for any specific questions about what was found during the examination.  If the procedure report does not answer your questions, please call your gastroenterologist to clarify.  If you requested that your care partner not be given the details of your procedure findings, then the procedure report has been included in a sealed envelope for you to review at your convenience later.  YOU SHOULD EXPECT: Some feelings of bloating in the abdomen. Passage of more gas than usual.  Walking can help get rid of the air that was put into your GI tract during the procedure and reduce the bloating. If you had a lower endoscopy (such as a colonoscopy or flexible sigmoidoscopy) you may notice spotting of blood in your stool or on the toilet paper. If you underwent a bowel prep for your procedure, you may not have a normal bowel movement for a few days.  Please Note:  You might notice some irritation and congestion in your nose or some drainage.  This is from the oxygen used during your procedure.  There is no need for concern and it should clear up in a day or so.  SYMPTOMS TO REPORT IMMEDIATELY:   Following lower endoscopy (colonoscopy or flexible sigmoidoscopy):  Excessive amounts of blood in the stool  Significant tenderness or worsening of abdominal pains  Swelling of the abdomen that is new, acute  Fever of 100F or higher   For urgent or emergent issues, a gastroenterologist can be reached at any hour by calling [phone].   DIET:  We do recommend a small meal at first, but then you may proceed to your regular diet.  Drink plenty of fluids but you should avoid alcoholic beverages for 24 hours.  ACTIVITY:  You should plan to take it easy for the rest of today and you should NOT DRIVE or use heavy machinery until tomorrow  (because of the sedation medicines used during the test).    FOLLOW UP: Our staff will call the number listed on your records 48-72 hours following your procedure to check on you and address any questions or concerns that you may have regarding the information given to you following your procedure. If we do not reach you, we will leave a message.  We will attempt to reach you two times.  During this call, we will ask if you have developed any symptoms of COVID 19. If you develop any symptoms (ie: fever, flu-like symptoms, shortness of breath, cough etc.) before then, please call [phone].  If you test positive for Covid 19 in the 2 weeks post procedure, please call and report this information to [place].    If any biopsies were taken you will be contacted by phone or by letter within the next 1-3 weeks.  Please call us at [phone] if you have not heard about the biopsies in 3 weeks.    SIGNATURES/CONFIDENTIALITY: You and/or your care partner have signed paperwork which will be entered into your electronic medical record.  These signatures attest to the fact that that the information above on your After Visit Summary has been reviewed and is understood.  Full responsibility of the confidentiality of this discharge information lies with you and/or your care-partner.

## 2020-02-07 NOTE — Progress Notes (Signed)
Pt's states no medical or surgical changes since previsit or office visit.  Temp taken by JB VS taken by DT

## 2020-02-07 NOTE — Op Note (Signed)
[place] Patient Name: Brooke Le Procedure Date: 02/07/2020 1:05 PM MRN: [id number] Endoscopist: Mallie Mussel L. Loletha Carrow , MD Age: 54 Referring MD:  Date of Birth: Nov 05, 1966 Gender: Female Account #: [id number] Procedure:                Colonoscopy Indications:              Screening for colorectal malignant neoplasm Medicines:                Monitored Anesthesia Care Procedure:                Pre-Anesthesia Assessment:                           - Prior to the procedure, a History and Physical                            was performed, and patient medications and                            allergies were reviewed. The patient's tolerance of                            previous anesthesia was also reviewed. The risks                            and benefits of the procedure and the sedation                            options and risks were discussed with the patient.                            All questions were answered, and informed consent                            was obtained. Prior Anticoagulants: The patient has                            taken no previous anticoagulant or antiplatelet                            agents. ASA Grade Assessment: III - A patient with                            severe systemic disease. After reviewing the risks                            and benefits, the patient was deemed in                            satisfactory condition to undergo the procedure.                           After obtaining informed consent, the colonoscope  was passed under direct vision. Throughout the                            procedure, the patient's blood pressure, pulse, and                            oxygen saturations were monitored continuously. The                            Colonoscope was introduced through the anus with                            the intention of advancing to the cecum. The scope                            was  advanced to the sigmoid colon before the                            procedure was aborted. Medications were given. The                            colonoscopy was performed with difficulty due to                            poor bowel prep. The patient tolerated the                            procedure well. The quality of the bowel                            preparation was poor. The bowel preparation used                            was SUPREP. Scope In: 1:14:21 PM Scope Out: 1:16:29 PM Total Procedure Duration: 0 hours 2 minutes 8 seconds  Findings:                 The perianal and digital rectal examinations were                            normal.                           A large amount of liquid semi-liquid stool was                            found in the rectum and in the distal sigmoid                            colon, precluding visualization. Scope could not be                            advanced any further. Complications:            No immediate complications. Estimated Blood  Loss:     Estimated blood loss: none. Impression:               - Preparation of the colon was poor.                           - Stool in the rectum and in the distal sigmoid                            colon.                           - No specimens collected. Recommendation:           - Patient has a contact number available for                            emergencies. The signs and symptoms of potential                            delayed complications were discussed with the                            patient. Return to normal activities tomorrow.                            Written discharge instructions were provided to the                            patient.                           - Resume previous diet.                           - Continue present medications.                           - Repeat colonoscopy at appointment to be scheduled                            for screening purposes. 2-DAY  PREP Mallie Mussel L. Loletha Carrow, MD 02/07/2020 1:23:52 PM This report has been signed electronically.

## 2020-02-11 ENCOUNTER — Telehealth: Payer: Self-pay | Admitting: *Deleted

## 2020-02-11 ENCOUNTER — Encounter: Payer: Self-pay | Admitting: Gastroenterology

## 2020-02-11 NOTE — Telephone Encounter (Signed)
  Follow up Call-  Call back number 02/07/2020  Post procedure Call Back phone  # [phone]  Permission to leave phone message Yes  Some recent data might be hidden     Patient questions:  Do you have a fever, pain , or abdominal swelling? No. Pain Score  0 *  Have you tolerated food without any problems? Yes.    Have you been able to return to your normal activities? Yes.    Do you have any questions about your discharge instructions: Diet   No. Medications  No. Follow up visit  No.  Do you have questions or concerns about your Care? No.  Actions: * If pain score is 4 or above: 1. No action needed, pain <4.Have you developed a fever since your procedure? no  2.   Have you had an respiratory symptoms (SOB or cough) since your procedure? no  3.   Have you tested positive for COVID 19 since your procedure no  4.   Have you had any family members/close contacts diagnosed with the COVID 19 since your procedure?  no   If yes to any of these questions please route to Joylene John, RN and Alphonsa Gin, Therapist, sports.

## 2020-02-12 ENCOUNTER — Other Ambulatory Visit: Payer: Self-pay

## 2020-02-12 ENCOUNTER — Ambulatory Visit (INDEPENDENT_AMBULATORY_CARE_PROVIDER_SITE_OTHER): Payer: BC Managed Care – PPO

## 2020-02-12 DIAGNOSIS — Z1159 Encounter for screening for other viral diseases: Secondary | ICD-10-CM

## 2020-02-12 LAB — SARS CORONAVIRUS 2 (TAT 6-24 HRS): SARS Coronavirus 2: NEGATIVE

## 2020-02-14 ENCOUNTER — Ambulatory Visit (AMBULATORY_SURGERY_CENTER): Payer: BC Managed Care – PPO | Admitting: Gastroenterology

## 2020-02-14 ENCOUNTER — Encounter: Payer: Self-pay | Admitting: Gastroenterology

## 2020-02-14 ENCOUNTER — Other Ambulatory Visit: Payer: Self-pay

## 2020-02-14 VITALS — BP 143/77 | HR 67 | Temp 97.8°F | Resp 20 | Ht 68.0 in | Wt 278.0 lb

## 2020-02-14 DIAGNOSIS — Z1211 Encounter for screening for malignant neoplasm of colon: Secondary | ICD-10-CM | POA: Diagnosis not present

## 2020-02-14 MED ORDER — SODIUM CHLORIDE 0.9 % IV SOLN: 500.0000 mL | Freq: Once | INTRAVENOUS | Status: DC

## 2020-02-14 NOTE — Progress Notes (Signed)
Temp ADB Vitals KA

## 2020-02-14 NOTE — Patient Instructions (Signed)
YOU HAD AN ENDOSCOPIC PROCEDURE TODAY AT THE [facility]:   Refer to the procedure report that was given to you for any specific questions about what was found during the examination.  If the procedure report does not answer your questions, please call your gastroenterologist to clarify.  If you requested that your care partner not be given the details of your procedure findings, then the procedure report has been included in a sealed envelope for you to review at your convenience later.  YOU SHOULD EXPECT: Some feelings of bloating in the abdomen. Passage of more gas than usual.  Walking can help get rid of the air that was put into your GI tract during the procedure and reduce the bloating. If you had a lower endoscopy (such as a colonoscopy or flexible sigmoidoscopy) you may notice spotting of blood in your stool or on the toilet paper. If you underwent a bowel prep for your procedure, you may not have a normal bowel movement for a few days.  Please Note:  You might notice some irritation and congestion in your nose or some drainage.  This is from the oxygen used during your procedure.  There is no need for concern and it should clear up in a day or so.  SYMPTOMS TO REPORT IMMEDIATELY:   Following lower endoscopy (colonoscopy or flexible sigmoidoscopy):  Excessive amounts of blood in the stool  Significant tenderness or worsening of abdominal pains  Swelling of the abdomen that is new, acute  Fever of 100F or higher  For urgent or emergent issues, a gastroenterologist can be reached at any hour by calling [phone]. Do not use MyChart messaging for urgent concerns.    DIET:  We do recommend a small meal at first, but then you may proceed to your regular diet.  Drink plenty of fluids but you should avoid alcoholic beverages for 24 hours.  ACTIVITY:  You should plan to take it easy for the rest of today and you should NOT DRIVE or use heavy machinery until tomorrow (because  of the sedation medicines used during the test).    FOLLOW UP: Our staff will call the number listed on your records 48-72 hours following your procedure to check on you and address any questions or concerns that you may have regarding the information given to you following your procedure. If we do not reach you, we will leave a message.  We will attempt to reach you two times.  During this call, we will ask if you have developed any symptoms of COVID 19. If you develop any symptoms (ie: fever, flu-like symptoms, shortness of breath, cough etc.) before then, please call [phone].  If you test positive for Covid 19 in the 2 weeks post procedure, please call and report this information to us.    If any biopsies were taken you will be contacted by phone or by letter within the next 1-3 weeks.  Please call us at [phone] if you have not heard about the biopsies in 3 weeks.    SIGNATURES/CONFIDENTIALITY: You and/or your care partner have signed paperwork which will be entered into your electronic medical record.  These signatures attest to the fact that that the information above on your After Visit Summary has been reviewed and is understood.  Full responsibility of the confidentiality of this discharge information lies with you and/or your care-partner. 

## 2020-02-14 NOTE — Op Note (Signed)
[place] Patient Name: Brooke Le Procedure Date: 02/14/2020 3:20 PM MRN: [id number] Endoscopist: Mallie Mussel L. Loletha Carrow , MD Age: 54 Referring MD:  Date of Birth: 02/22/1966 Gender: Female Account #: [id number] Procedure:                Colonoscopy Indications:              Screening for colorectal malignant neoplasm, This                            is the patient's first colonoscopy Medicines:                Monitored Anesthesia Care Procedure:                Pre-Anesthesia Assessment:                           - Prior to the procedure, a History and Physical                            was performed, and patient medications and                            allergies were reviewed. The patient's tolerance of                            previous anesthesia was also reviewed. The risks                            and benefits of the procedure and the sedation                            options and risks were discussed with the patient.                            All questions were answered, and informed consent                            was obtained. Prior Anticoagulants: The patient has                            taken no previous anticoagulant or antiplatelet                            agents. ASA Grade Assessment: III - A patient with                            severe systemic disease. After reviewing the risks                            and benefits, the patient was deemed in                            satisfactory condition to undergo the procedure.  After obtaining informed consent, the colonoscope                            was passed under direct vision. Throughout the                            procedure, the patient's blood pressure, pulse, and                            oxygen saturations were monitored continuously. The                            Colonoscope was introduced through the anus and                            advanced to the  the cecum, identified by                            appendiceal orifice and ileocecal valve. The                            colonoscopy was performed with difficulty due to a                            redundant colon, significant looping and the                            patient's body habitus. Successful completion of                            the procedure was aided by changing the patient to                            a semi-prone position and using manual pressure.                            The patient tolerated the procedure well. The                            quality of the bowel preparation was good. The                            ileocecal valve, appendiceal orifice, and rectum                            were photographed. The bowel preparation used was 2                            day Miralax/Plenvu (patient was rescheduled from a                            colonoscopy attempt the week prior with poor  preparation). Scope In: 3:25:16 PM Scope Out: 3:45:10 PM Scope Withdrawal Time: 0 hours 8 minutes 11 seconds  Total Procedure Duration: 0 hours 19 minutes 54 seconds  Findings:                 The perianal and digital rectal examinations were                            normal.                           The colon (entire examined portion) was                            significantly redundant, causing scope looping and                            challenging scope passage.                           There is no endoscopic evidence of polyps in the                            entire colon.                           The exam was otherwise without abnormality on                            direct and retroflexion views. Complications:            No immediate complications. Estimated Blood Loss:     Estimated blood loss: none. Impression:               - Redundant colon.                           - The examination was otherwise normal on direct                             and retroflexion views.                           - No specimens collected. Recommendation:           - Patient has a contact number available for                            emergencies. The signs and symptoms of potential                            delayed complications were discussed with the                            patient. Return to normal activities tomorrow.                            Written discharge instructions were provided to the  patient.                           - Resume previous diet.                           - Continue present medications.                           - Repeat colonoscopy in 10 years for screening                            purposes. Tramar Brueckner L. Loletha Carrow, MD 02/14/2020 3:51:37 PM This report has been signed electronically.

## 2020-02-14 NOTE — Progress Notes (Signed)
Pt Drowsy. VSS. To PACU, report to RN. No anesthetic complications noted.  

## 2020-02-18 ENCOUNTER — Telehealth: Payer: Self-pay

## 2020-02-18 NOTE — Telephone Encounter (Signed)
  Follow up Call-  Call back number 02/14/2020 02/07/2020  Post procedure Call Back phone  # [phone]  Permission to leave phone message Yes Yes  Some recent data might be hidden     Patient questions:  Do you have a fever, pain , or abdominal swelling? No. Pain Score  0 *  Have you tolerated food without any problems? Yes.    Have you been able to return to your normal activities? Yes.    Do you have any questions about your discharge instructions: Diet   No. Medications  No. Follow up visit  No.  Do you have questions or concerns about your Care? No.  Actions: * If pain score is 4 or above: No action needed, pain <4.  1. Have you developed a fever since your procedure? no  2.   Have you had an respiratory symptoms (SOB or cough) since your procedure? no  3.   Have you tested positive for COVID 19 since your procedure no  4.   Have you had any family members/close contacts diagnosed with the COVID 19 since your procedure?  no   If yes to any of these questions please route to Joylene John, RN and Alphonsa Gin, Therapist, sports.

## 2020-02-28 ENCOUNTER — Other Ambulatory Visit: Payer: Self-pay

## 2020-02-28 ENCOUNTER — Telehealth (INDEPENDENT_AMBULATORY_CARE_PROVIDER_SITE_OTHER): Payer: BC Managed Care – PPO | Admitting: Internal Medicine

## 2020-02-28 ENCOUNTER — Encounter: Payer: Self-pay | Admitting: Internal Medicine

## 2020-02-28 VITALS — Ht 68.0 in | Wt 275.0 lb

## 2020-02-28 DIAGNOSIS — M79606 Pain in leg, unspecified: Secondary | ICD-10-CM | POA: Diagnosis not present

## 2020-02-28 DIAGNOSIS — T148XXA Other injury of unspecified body region, initial encounter: Secondary | ICD-10-CM | POA: Diagnosis not present

## 2020-02-28 MED ORDER — AMOXICILLIN-POT CLAVULANATE 875-125 MG PO TABS: 1.0000 | ORAL_TABLET | Freq: Two times a day (BID) | ORAL | 0 refills | Status: DC

## 2020-02-28 NOTE — Progress Notes (Signed)
Virtual Visit via Video Note  I connected with@ on 02/28/20 at 11:00 AM EDT by a video enabled telemedicine application and verified that I am speaking with the correct person using two identifiers. Location patient: in car  Taking father to doctor  Location provider: home office Persons participating in the virtual visit: patient, provider  [name] for concerns with  Leg pain  No discrete injury but noted on weekend about 4 days ago got up and had discomfort pain in leg on getting out of her car  and walking  And off and on felt swollen but wasn't  .  Noted a scratch just below the knee and had some superficial drainage?   And has been doctoring with antibiotic  Topical and now looking as healing  Without a lot of redness and no dc . Today she didn't have pain in leg but  Had made appt  In case  Tried to send in pix but  Downloading  A problem now so cannot see the leg.  No fever  No swleeeing or joint swelling  ROS: See pertinent positives and negatives per HPI.  Past Medical History:  Diagnosis Date  . Allergic rhinitis   . Allergy   . Anemia, iron deficiency   . Anxiety   . Fibromyalgia   . Gynecological examination    Dr. Nori Riis  . [id number]) 08/21/2007   Qualifier: Diagnosis of  By: Regis Bill MD, Standley Brooking   . Hypertension    had decreased with weight loss  . Low vitamin B12 level   . Migraine headache    hx of seen in [facility] in the remote past  . Obesity   . 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 Syncope    ER evaluation in 2008; had anemia then    Past Surgical History:  Procedure Laterality  Date  . ADENOIDECTOMY    . ESOPHAGOGASTRODUODENOSCOPY  10/31/2002  . GASTRIC BYPASS    . TUBAL LIGATION    . UPPER GASTROINTESTINAL ENDOSCOPY      Family History  Problem Relation Age of Onset  . Alcohol abuse Other   . Diabetes Other   . Hypertension Other   . Prostate cancer Father   . Esophageal cancer Maternal Aunt   . Colon cancer Maternal Uncle   . Colon cancer Cousin   . Colon cancer Other   . Rectal cancer Neg Hx   . Stomach cancer Neg Hx     Social History   Tobacco Use  . Smoking status: Never Smoker  . Smokeless tobacco: Never Used  Substance Use Topics  . Alcohol use: Yes    Comment: socially   . Drug use: Never      Current Outpatient Medications:  .  amitriptyline (ELAVIL) 10 MG tablet, Take 2 tablets (20 mg total) by mouth at bedtime., Disp: 180 tablet, Rfl: 2 .  Black Elderberry,Berry-Flower, 575 MG CAPS, Take by mouth., Disp: , Rfl:  .  hydrochlorothiazide (HYDRODIURIL) 25 MG tablet, TAKE 1 TABLET BY MOUTH EVERY DAY, Disp: 30 tablet, Rfl: 0 .  ibuprofen (ADVIL) 800 MG tablet, , Disp: ,  Rfl:  .  verapamil (CALAN-SR) 240 MG CR tablet, TAKE 1 TABLET (240 MG TOTAL) BY MOUTH AT BEDTIME., Disp: 90 tablet, Rfl: 1 .  vitamin B-12 (CYANOCOBALAMIN) 1000 MCG tablet, Take 1,000 mcg by mouth daily., Disp: , Rfl:  .  Vitamin D, Ergocalciferol, (DRISDOL) 1.25 MG (50000 UNIT) CAPS capsule, TAKE 1 CAPSULE (50,000 UNITS TOTAL) BY MOUTH EVERY 7 (SEVEN) DAYS., Disp: 12 capsule, Rfl: 0 .  amoxicillin-clavulanate (AUGMENTIN) 875-125 MG tablet, Take 1 tablet by mouth every 12 (twelve) hours. If needed for skin infection, Disp: 10 tablet, Rfl: 0  EXAM: BP Readings from Last 3 Encounters:  02/14/20 (!) 143/77  02/07/20 134/73  12/19/19 124/62    VITALS per patient if applicable:  GENERAL: alert, oriented, appears well and in no acute distress  HEENT: atraumatic, conjunttiva clear, no obvious abnormalities on inspection of external nose and ears  NECK: normal  movements of the head and neck  LUNGS: on inspection no signs of respiratory distress, breathing rate appears normal, no obvious gross SOB, gasping or wheezing  CV: no obvious cyanosis  MS: unfortunately cannot see her leg   Cant get camera to her and will still try to send in pix a     PSYCH/NEURO: pleasant and cooperative, no obvious depression or anxiety, speech and thought processing grossly intact   ASSESSMENT AND PLAN:  Discussed the following assessment and plan:    ICD-10-CM   1. Acute pain of lower extremity, unspecified laterality  M79.606    see text  beter today  2. Skin abrasion  T14.8XXA    below knee   healing   unfortunately cannot visualize the leg but hx   Consider a skin infection that is now probably healing since the pain is subsided today .   Because of weekend coming  Will send in Augmentin bid fo 5 days and not to take unless looks infected again .  If returning sx and pain swelling then needs in person visit  At this time not cw dvt but   Would add evaluation if recurrence  And progression of sx  Counseled.   Expectant management and discussion of plan and treatment with opportunity to ask questions and all were answered. The patient agreed with the plan and demonstrated an understanding of the instructions.   Advised to call back or seek an in-person evaluation if worsening  or having  further concerns . Return if symptoms recurring or  worsen, for will try to send in picture again.    Shanon Ace, MD

## 2020-03-02 ENCOUNTER — Other Ambulatory Visit: Payer: Self-pay

## 2020-03-17 ENCOUNTER — Ambulatory Visit: Payer: BC Managed Care – PPO

## 2020-03-20 ENCOUNTER — Ambulatory Visit: Payer: BC Managed Care – PPO

## 2020-03-20 ENCOUNTER — Ambulatory Visit: Payer: BC Managed Care – PPO | Attending: Family

## 2020-03-20 DIAGNOSIS — Z23 Encounter for immunization: Secondary | ICD-10-CM

## 2020-03-20 NOTE — Progress Notes (Signed)
   Covid-19 Vaccination Clinic  Name:  CARTINA PROTHERO    MRN: [id number] DOB: 19-Jan-1966  03/20/2020  Ms. Pinder-Sargeant was observed post Covid-19 immunization for 15 minutes without incident. She was provided with Vaccine Information Sheet and instruction to access the V-Safe system.   Ms. Delgaudio was instructed to call 911 with any severe reactions post vaccine: [name] Difficulty breathing  . Swelling of face and throat  . A fast heartbeat  . A bad rash all over body  . Dizziness and weakness   Immunizations Administered    Name Date Dose VIS Date Route   Moderna COVID-19 Vaccine 03/20/2020 12:00 PM 0.5 mL 11/13/2019 Intramuscular   Manufacturer: Moderna   Lot: [id number]   [id number]

## 2020-04-01 ENCOUNTER — Other Ambulatory Visit: Payer: Self-pay | Admitting: Internal Medicine

## 2020-04-22 ENCOUNTER — Ambulatory Visit: Payer: BC Managed Care – PPO | Attending: Internal Medicine

## 2020-05-24 ENCOUNTER — Ambulatory Visit: Payer: BC Managed Care – PPO

## 2020-05-26 NOTE — Progress Notes (Deleted)
No chief complaint on file.   HPI: Brooke Le 54 y.o. come in for Chronic disease management   Bp HAs  ROS: See pertinent positives and negatives per HPI.  Past Medical History:  Diagnosis Date  . Allergic rhinitis   . Allergy   . Anemia, iron deficiency   . Anxiety   . Fibromyalgia   . Gynecological examination    Dr. Nori Riis  . [id number](989.2) 08/21/2007   Qualifier: Diagnosis of  By: Regis Bill MD, Standley Brooking   . Hypertension    had decreased with weight loss  . Low vitamin B12 level   . Migraine headache    [facility] of [facility] in [facility] in [facility] past  . Obesity   . 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 Syncope    ER evaluation in 2008; had anemia then    Family History  Problem Relation Age of Onset  . Alcohol abuse Other   . Diabetes Other   . Hypertension Other   . Prostate cancer Father   . Esophageal cancer Maternal Aunt   . Colon cancer Maternal Uncle   . Colon cancer Cousin   . Colon cancer Other   . Rectal cancer Neg Hx   . Stomach cancer Neg Hx     Social History   Socioeconomic History  . Marital status: Married    Spouse name: Not on file  . Number of children: Not on file  . Years of education: Not on file  . Highest education level: Not on file  Occupational History  . Not on file  Tobacco Use  . Smoking status: Never Smoker  . Smokeless tobacco: Never Used  Vaping Use  . Vaping Use: Never used  Substance and Sexual Activity  . Alcohol use: Yes    Comment: socially   . Drug use: Never  . Sexual activity: Not on file  Other Topics Concern  . Not on file  Social History Narrative   Married   Regular Exercise- no   Laid off [employer]- July 2011 lost insurance in [place] 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    No pets             Social Determinants of Radio broadcast assistant Strain:   .  Difficulty of Paying Living Expenses:   Food Insecurity:   . Worried About Charity fundraiser in the Last Year:   . Arboriculturist in the Last Year:   Transportation Needs:   . Film/video editor (Medical):   [name] Lack of Transportation (Non-Medical):   Physical Activity:   . Days of Exercise per Week:   . Minutes of Exercise per Session:   Stress:   . Feeling of Stress :   Social Connections:   . Frequency of Communication with Friends and Family:   . Frequency of Social Gatherings with Friends and Family:   . Attends Religious Services:   . Active Member of Clubs or Organizations:   . Attends Archivist Meetings:   [name] Marital Status:     Outpatient Medications Prior to Visit  Medication Sig Dispense Refill  . amitriptyline (ELAVIL) 10 MG tablet Take 2 tablets (20 mg total) by mouth at bedtime. 180 tablet 2  . amoxicillin-clavulanate (AUGMENTIN) 875-125 MG tablet  Take 1 tablet by mouth every 12 (twelve) hours. If needed for skin infection 10 tablet 0  . Black Elderberry,Berry-Flower, 575 MG CAPS Take by mouth.    . hydrochlorothiazide (HYDRODIURIL) 25 MG tablet TAKE 1 TABLET BY MOUTH EVERY DAY 30 tablet 0  . ibuprofen (ADVIL) 800 MG tablet     . verapamil (CALAN-SR) 240 MG CR tablet TAKE 1 TABLET (240 MG TOTAL) BY MOUTH AT BEDTIME. 90 tablet 1  . vitamin B-12 (CYANOCOBALAMIN) 1000 MCG tablet Take 1,000 mcg by mouth daily.    . Vitamin D, Ergocalciferol, (DRISDOL) 1.25 MG (50000 UNIT) CAPS capsule TAKE 1 CAPSULE (50,000 UNITS TOTAL) BY MOUTH EVERY 7 (SEVEN) DAYS. 12 capsule 2   No facility-administered medications prior to visit.     EXAM:  LMP 10/04/2018 (Approximate)   There is no height or weight on file to calculate BMI. Wt Readings from Last 3 Encounters:  02/28/20 275 lb (124.7 kg)  02/14/20 278 lb (126.1 kg)  02/07/20 279 lb (126.6 kg)    GENERAL: vitals reviewed and listed above, alert, oriented, appears well hydrated and in no acute distress HEENT:  atraumatic, conjunctiva  clear, no obvious abnormalities on inspection of external nose and ears OP : no lesion edema or exudate  NECK: no obvious masses on inspection palpation  LUNGS: clear to auscultation bilaterally, no wheezes, rales or rhonchi, good air movement CV: HRRR, no clubbing cyanosis or  peripheral edema nl cap refill  MS: moves all extremities without noticeable focal  abnormality PSYCH: pleasant and cooperative, no obvious depression or anxiety Lab Results  Component Value Date   WBC 5.1 12/19/2019   HGB 12.7 12/19/2019   HCT 38.0 12/19/2019   PLT 271.0 12/19/2019   GLUCOSE 94 10/22/2019   CHOL 173 10/22/2019   TRIG 63.0 10/22/2019   HDL 51.60 10/22/2019   LDLCALC 109 (H) 10/22/2019   ALT 18 10/22/2019   AST 18 10/22/2019   NA 139 10/22/2019   K 4.5 10/22/2019   CL 104 10/22/2019   CREATININE 1.02 10/22/2019   BUN 16 10/22/2019   CO2 28 10/22/2019   TSH 1.01 10/22/2019   HGBA1C 5.7 10/22/2019   BP Readings from Last 3 Encounters:  02/14/20 (!) 143/77  02/07/20 134/73  12/19/19 124/62    ASSESSMENT AND PLAN:  Discussed the following assessment and plan:  No diagnosis found.  -Patient advised to return or notify health care team  if  new concerns arise.  There are no Patient Instructions on file for this visit.   Standley Brooking. Benedetta Sundstrom M.D.

## 2020-05-27 ENCOUNTER — Ambulatory Visit: Payer: BC Managed Care – PPO | Admitting: Internal Medicine

## 2020-06-05 ENCOUNTER — Ambulatory Visit: Payer: BC Managed Care – PPO

## 2020-06-05 ENCOUNTER — Telehealth: Payer: Self-pay | Admitting: Internal Medicine

## 2020-06-05 NOTE — Telephone Encounter (Signed)
Pt is calling in wanting to get a referral for R leg pain from a previous incident about 16 yrs ago and it is flaring up again.  Pt has been scheduled 06/06/2020 @ 9:00.

## 2020-06-06 ENCOUNTER — Other Ambulatory Visit: Payer: Self-pay

## 2020-06-06 ENCOUNTER — Encounter: Payer: Self-pay | Admitting: Internal Medicine

## 2020-06-06 ENCOUNTER — Ambulatory Visit: Payer: BC Managed Care – PPO | Admitting: Internal Medicine

## 2020-06-06 ENCOUNTER — Ambulatory Visit (HOSPITAL_COMMUNITY)
Admission: RE | Admit: 2020-06-06 | Discharge: 2020-06-06 | Disposition: A | Discharge status: Home / Self Care | Payer: BC Managed Care – PPO | Source: Ambulatory Visit | Attending: Internal Medicine | Admitting: Internal Medicine

## 2020-06-06 VITALS — BP 130/76 | HR 76 | Temp 97.5°F | Ht 68.0 in | Wt 285.2 lb

## 2020-06-06 DIAGNOSIS — M7989 Other specified soft tissue disorders: Secondary | ICD-10-CM

## 2020-06-06 DIAGNOSIS — I10 Essential (primary) hypertension: Secondary | ICD-10-CM

## 2020-06-06 DIAGNOSIS — M79605 Pain in left leg: Secondary | ICD-10-CM

## 2020-06-06 DIAGNOSIS — Z79899 Other long term (current) drug therapy: Secondary | ICD-10-CM

## 2020-06-06 LAB — CBC WITH DIFFERENTIAL/PLATELET
Basophils Absolute: 0 10*3/uL (ref 0.0–0.1)
Basophils Relative: 0.5 % (ref 0.0–3.0)
Eosinophils Absolute: 0.2 10*3/uL (ref 0.0–0.7)
Eosinophils Relative: 3.5 % (ref 0.0–5.0)
HCT: 35.8 % — ABNORMAL LOW (ref 36.0–46.0)
Hemoglobin: 11.9 g/dL — ABNORMAL LOW (ref 12.0–15.0)
Lymphocytes Relative: 29.4 % (ref 12.0–46.0)
Lymphs Abs: 1.5 10*3/uL (ref 0.7–4.0)
MCHC: 33.1 g/dL (ref 30.0–36.0)
MCV: 87.7 fl (ref 78.0–100.0)
Monocytes Absolute: 0.3 10*3/uL (ref 0.1–1.0)
Monocytes Relative: 6.3 % (ref 3.0–12.0)
Neutro Abs: 3.1 10*3/uL (ref 1.4–7.7)
Neutrophils Relative %: 60.3 % (ref 43.0–77.0)
Platelets: 222 10*3/uL (ref 150.0–400.0)
RBC: 4.09 Mil/uL (ref 3.87–5.11)
RDW: 14.1 % (ref 11.5–15.5)
WBC: 5.1 10*3/uL (ref 4.0–10.5)

## 2020-06-06 LAB — HEPATIC FUNCTION PANEL
ALT: 15 U/L (ref 0–35)
AST: 17 U/L (ref 0–37)
Albumin: 3.8 g/dL (ref 3.5–5.2)
Alkaline Phosphatase: 169 U/L — ABNORMAL HIGH (ref 39–117)
Bilirubin, Direct: 0.1 mg/dL (ref 0.0–0.3)
Total Bilirubin: 0.6 mg/dL (ref 0.2–1.2)
Total Protein: 6.3 g/dL (ref 6.0–8.3)

## 2020-06-06 LAB — BASIC METABOLIC PANEL
BUN: 13 mg/dL (ref 6–23)
CO2: 27 mEq/L (ref 19–32)
Calcium: 8.9 mg/dL (ref 8.4–10.5)
Chloride: 108 mEq/L (ref 96–112)
Creatinine, Ser: 1.06 mg/dL (ref 0.40–1.20)
GFR: 65.3 mL/min (ref >60.00)
Glucose, Bld: 94 mg/dL (ref 70–99)
Potassium: 4 mEq/L (ref 3.5–5.1)
Sodium: 142 mEq/L (ref 135–145)

## 2020-06-06 LAB — TSH: TSH: 1.02 u[IU]/mL (ref 0.35–4.50)

## 2020-06-06 NOTE — Progress Notes (Signed)
Blood tests  borderline anemia again  but not severe.  Kidney function is good  Take vitamins as we discussed . Also with some iron   Depending on how leg doing  plan rov in 2 months to follow up.

## 2020-06-06 NOTE — Patient Instructions (Signed)
Restart the diuretic   [place] check for clot in leg Lab today

## 2020-06-06 NOTE — Progress Notes (Signed)
Chief Complaint  Patient presents with  . Leg Pain    Left leg pain, pain is getting worse, would like a referral    HPI: Brooke Le 54 y.o. come in for  Worsening lef tleg pain  She is now working longer shifts  Night jsut got off  And for the past week or so inc swelling and pain lateral shin area  Sharper at times  Legs more swollen but only takine diuretic as needed ( last a week or so ago)  Very stretched with responsibility  Got modern vaccine in March and not until yesterday got second Moderna worried it could cause a problem  Was told she needs to start over with the  vaccine cause was late. ? July 22? No hx dvt or fam hx  No resp sx  No bleeding  Has gained some weight lots of stress  bp not too bad at home  uncetain if needs thyroid recheck  ROS: See pertinent positives and negatives per HPI. No cp sob   Past Medical History:  Diagnosis Date  . Allergic rhinitis   . Allergy   . Anemia, iron deficiency   . Anxiety   . Fibromyalgia   . Gynecological examination    Dr. Nori Riis  . GLOVFIEP(329.5) 08/21/2007   Qualifier: Diagnosis of  By: Regis Bill MD, Standley Brooking   . Hypertension    had decreased with weight loss  . Low vitamin B12 level   . Migraine headache    [facility] of [facility] in [facility] in [facility] past  . Obesity   . 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 Syncope    ER evaluation in 2008; had anemia then    Family History  Problem Relation Age of Onset  . Alcohol abuse Other   . Diabetes Other   . Hypertension Other   . Prostate cancer Father   . Esophageal cancer Maternal Aunt   . Colon cancer Maternal Uncle   . Colon cancer Cousin   . Colon cancer Other   . Rectal cancer Neg Hx   . Stomach cancer Neg Hx     Social History   Socioeconomic History  . Marital status: Married    Spouse name: Not on file  . Number of children: Not on file  . Years of education: Not on file  . Highest  education level: Not on file  Occupational History  . Not on file  Tobacco Use  . Smoking status: Never Smoker  . Smokeless tobacco: Never Used  Vaping Use  . Vaping Use: Never used  Substance and Sexual Activity  . Alcohol use: Yes    Comment: socially   . Drug use: Never  . Sexual activity: Not on file  Other Topics Concern  . 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 Difficulty of Paying Living Expenses:   Food Insecurity:   . Worried About Charity fundraiser in the Last Year:   . [employer]  in the Last Year:   Transportation Needs:   . Film/video editor (Medical):   [name] Lack of Transportation (Non-Medical):   Physical Activity:   . Days of Exercise per Week:   . Minutes of Exercise per Session:   Stress:   . Feeling of Stress :   Social Connections:   . Frequency of Communication with Friends and Family:   . Frequency of Social Gatherings with Friends and Family:   . Attends Religious Services:   . Active Member of Clubs or Organizations:   . Attends Archivist Meetings:   [name] Marital Status:     Outpatient Medications Prior to Visit  Medication Sig Dispense Refill  . amitriptyline (ELAVIL) 10 MG tablet Take 2 tablets (20 mg total) by mouth at bedtime. 180 tablet 2  . Black Elderberry,Berry-Flower, 575 MG CAPS Take by mouth.    [name] ibuprofen (ADVIL) 800 MG tablet     . verapamil (CALAN-SR) 240 MG CR tablet TAKE 1 TABLET (240 MG TOTAL) BY MOUTH AT BEDTIME. 90 tablet 1  . vitamin B-12 (CYANOCOBALAMIN) 1000 MCG tablet Take 1,000 mcg by mouth daily.    [name] amoxicillin-clavulanate (AUGMENTIN) 875-125 MG tablet Take 1 tablet by mouth every 12 (twelve) hours. If needed for skin  infection (Patient not taking: Reported on 06/06/2020) 10 tablet 0  . hydrochlorothiazide (HYDRODIURIL) 25 MG tablet TAKE 1 TABLET BY MOUTH EVERY DAY (Patient not taking: Reported on 06/06/2020) 30 tablet 0  . Vitamin D, Ergocalciferol, (DRISDOL) 1.25 MG (50000 UNIT) CAPS capsule TAKE 1 CAPSULE (50,000 UNITS TOTAL) BY MOUTH EVERY 7 (SEVEN) DAYS. (Patient not taking: Reported on 06/06/2020) 12 capsule 2   No facility-administered medications prior to visit.     EXAM:  BP 130/76   Pulse 76   Temp (!) 97.5 F (36.4 C) (Temporal)   Ht 5\' 8"  (1.727 m)   Wt 285 lb 3.2 oz (129.4 kg)   LMP 10/04/2018 (Approximate)   SpO2 94%   BMI 43.36 kg/m   Body mass index is 43.36 kg/m. Wt Readings from Last 3 Encounters:  06/06/20 285 lb 3.2 oz (129.4 kg)  02/28/20 275 lb (124.7 kg)  02/14/20 278 lb (126.1 kg)    GENERAL: vitals reviewed and listed above, alert, oriented, appears well hydrated and in no acute distress tired but non toxic and normal  HEENT: atraumatic, conjunctiva  clear, no obvious abnormalities on inspection of external nose and ears OP : masked  NECK: no obvious masses on inspection palpation  LUNGS: clear to auscultation bilaterally, no wheezes, rales or rhonchi, good air movement CV: HRRR, no clubbing cyanosis  Legs 2+ ewdema left more than right   No calf tenderenss  Left paratibial pain  tinu nodule but no redness or mass  MS: moves all extremities  PSYCH: pleasant and cooperative, no obvious depression or anxiety  Lab Results  Component Value Date   WBC 5.1 12/19/2019   HGB 12.7 12/19/2019   HCT 38.0 12/19/2019   PLT 271.0 12/19/2019   GLUCOSE 94 10/22/2019   CHOL 173 10/22/2019   TRIG 63.0 10/22/2019   HDL 51.60 10/22/2019   LDLCALC 109 (H) 10/22/2019   ALT 18 10/22/2019   AST 18 10/22/2019   NA 139 10/22/2019   K 4.5 10/22/2019   CL 104 10/22/2019   CREATININE 1.02 10/22/2019   BUN 16 10/22/2019   CO2 28 10/22/2019   TSH 1.01 10/22/2019   HGBA1C 5.7  10/22/2019   BP Readings from Last 3  Encounters:  06/06/20 130/76  02/14/20 (!) 143/77  02/07/20 134/73    ASSESSMENT AND PLAN:  Discussed the following assessment and plan:  Left leg pain - Plan: Basic metabolic panel, CBC with Differential/Platelet, TSH, Hepatic function panel, VAS [place] LOWER EXTREMITY VENOUS (DVT), CANCELED: US Venous Img Lower Unilateral Left (DVT)  Leg swelling - Plan: Basic metabolic panel, CBC with Differential/Platelet, TSH, Hepatic function panel, VAS [place] LOWER EXTREMITY VENOUS (DVT), CANCELED: US Venous Img Lower Unilateral Left (DVT)  Medication management - Plan: Basic metabolic panel, CBC with Differential/Platelet, TSH, Hepatic function panel  Essential hypertension - Plan: Basic metabolic panel, CBC with Differential/Platelet, TSH, Hepatic function panel Doubt a vaccine issue and not sure aney evidence for repeating  Another injection  But at this time  Check doppler for dvt ( today at 4 pm staty)   make the diuretic every day  Assess work status after [place] result back Disc potential rx  If pos testing  -Patient advised to return or notify health care team  if  new concerns arise. Ps she should probably be taking bariatric vits     Patient Instructions  Restart the diuretic   [place] check for clot in leg Lab today     Standley Brooking. Kimela Malstrom M.D.

## 2020-06-06 NOTE — Progress Notes (Signed)
Good news  no blood clot in leg   Take the diuretic fluid pill every day  Also make sure taking b vitamins ( bariatric vitamins ok  because you have had bariatric surgery )    Elevate legs for the next few days till swelling goes down  Ok to write a note out of work for 3 days  to do t his If pain ongoing we will get a referral to  sports medicine  or ortho

## 2020-06-10 ENCOUNTER — Other Ambulatory Visit: Payer: Self-pay

## 2020-06-10 DIAGNOSIS — M79605 Pain in left leg: Secondary | ICD-10-CM

## 2020-06-10 DIAGNOSIS — M7989 Other specified soft tissue disorders: Secondary | ICD-10-CM

## 2020-06-10 NOTE — Telephone Encounter (Signed)
Not typical to get congestion after the vaccine  You may want to get covid tested  Even though not that sick   Ok to extend note to Thursday ( megan please  Get her an updated  note  Thanks)  Refer to sports medicine or ortho for leg pain  Left

## 2020-06-10 NOTE — Telephone Encounter (Signed)
Pt is calling to check the status of a updated note due to her still not feeling any better

## 2020-06-13 ENCOUNTER — Telehealth (INDEPENDENT_AMBULATORY_CARE_PROVIDER_SITE_OTHER): Payer: BC Managed Care – PPO | Admitting: Internal Medicine

## 2020-06-13 ENCOUNTER — Other Ambulatory Visit: Payer: Self-pay

## 2020-06-13 ENCOUNTER — Encounter: Payer: Self-pay | Admitting: Internal Medicine

## 2020-06-13 VITALS — Ht 68.0 in | Wt 272.0 lb

## 2020-06-13 DIAGNOSIS — J22 Unspecified acute lower respiratory infection: Secondary | ICD-10-CM | POA: Diagnosis not present

## 2020-06-13 DIAGNOSIS — M79605 Pain in left leg: Secondary | ICD-10-CM | POA: Diagnosis not present

## 2020-06-13 MED ORDER — BENZONATATE 100 MG PO CAPS: 100.0000 mg | ORAL_CAPSULE | Freq: Three times a day (TID) | ORAL | 0 refills | Status: DC | PRN

## 2020-06-13 MED ORDER — MUCINEX DM MAXIMUM STRENGTH 60-1200 MG PO TB12: 1.0000 | ORAL_TABLET | Freq: Two times a day (BID) | ORAL | 1 refills | Status: DC | PRN

## 2020-06-13 NOTE — Telephone Encounter (Signed)
So  This is probably a viral infection that lasts 1-2 weeks  Seems  Many  people have recently had in community and get better .   But if having fever more than 2-3 days shortness of breath in chest  Or serious persistent pain  Chest  Head  Over days without  Help from tylenol etc.  Then soul have a visit  ( would still have to be virtual ) I suggest    ie signs of pneumonia  Are relapsing fever chills  Chest pain coughing up blood  Shortness of breath  sicker  Again .

## 2020-06-13 NOTE — Progress Notes (Signed)
Virtual Visit via Video Note  I connected with@ on 06/13/20 at  3:00 PM EDT by a video enabled telemedicine application and verified that I am speaking with the correct person using two identifiers. Location patient: home Location provider:work office Persons participating in the virtual visit: patient, provider  [name]   For resp sx  And fu  Leg is some better swelling  as has rested    And on hctz   Has fu appt  On Tues  July 7  Brooke Le SM 3 pm   THe resp infection is coughing very badly hard to sleep   Not as bad as a few days ago  Has achy and  And hard to stop cough  No real sob has some stuffiness   Hard tot sleep and is hoarse. Had covid test negative   For this illness and has had  2 vaccine far apart  Also  will need  forms completed from time out of work     ROS: See pertinent positives and negatives per HPI.  Past Medical History:  Diagnosis Date  . Allergic rhinitis   . Allergy   . Anemia, iron deficiency   . Anxiety   . Fibromyalgia   . Gynecological examination    Brooke. Nori Le  . [id number](542.7) 08/21/2007   Qualifier: Diagnosis of  By: Brooke Le, Brooke Le   . Hypertension    had decreased with weight loss  . Low vitamin B12 level   . Migraine headache    hx of seen in [facility] in the remote past  . Obesity   . PSTPRC STATUS, BARIATRIC SURGERY 08/21/2007   Qualifier: Diagnosis of  By: Brooke Le, Brooke Le   . Syncope    ER evaluation in 2008; had anemia then    Past Surgical History:  Procedure Laterality Date  . ADENOIDECTOMY    . ESOPHAGOGASTRODUODENOSCOPY  10/31/2002  . GASTRIC BYPASS    . TUBAL LIGATION    . UPPER GASTROINTESTINAL ENDOSCOPY       Family History  Problem Relation Age of Onset  . Alcohol abuse Other   . Diabetes Other   . Hypertension Other   . Prostate cancer Father   . Esophageal cancer Maternal Aunt   . Colon cancer Maternal Uncle   . Colon cancer Cousin   . Colon cancer Other   . Rectal cancer Neg Hx   . Stomach cancer Neg Hx     Social History   Tobacco Use  . Smoking status: Never Smoker  . Smokeless tobacco: Never Used  Vaping Use  . Vaping Use: Never used  Substance Use Topics  . Alcohol use: Yes    Comment: socially   . Drug use: Never      Current Outpatient Medications:  .  amitriptyline (ELAVIL) 10 MG tablet, Take 2 tablets (20 mg total) by mouth at bedtime., Disp: 180 tablet, Rfl: 2 .  ascorbic acid (VITAMIN C) 500 MG tablet, Take 500 mg by mouth daily., Disp: , Rfl:  .  Black Elderberry,Berry-Flower, 575 MG CAPS, Take by mouth., Disp: , Rfl:  .  hydrochlorothiazide (HYDRODIURIL) 25 MG tablet, TAKE 1 TABLET BY MOUTH  EVERY DAY, Disp: 30 tablet, Rfl: 0 .  ibuprofen (ADVIL) 800 MG tablet, , Disp: , Rfl:  .  verapamil (CALAN-SR) 240 MG CR tablet, TAKE 1 TABLET (240 MG TOTAL) BY MOUTH AT BEDTIME., Disp: 90 tablet, Rfl: 1 .  vitamin B-12 (CYANOCOBALAMIN) 1000 MCG tablet, Take 1,000 mcg by mouth daily., Disp: , Rfl:  .  Zinc 50 MG TABS, Take 1 tablet by mouth daily., Disp: , Rfl:  .  benzonatate (TESSALON PERLES) 100 MG capsule, Take 1-2 capsules (100-200 mg total) by mouth 3 (three) times daily as needed for cough., Disp: 30 capsule, Rfl: 0 .  Dextromethorphan-guaiFENesin (MUCINEX DM MAXIMUM STRENGTH) 60-1200 MG TB12, Take 1 tablet by mouth every 12 (twelve) hours as needed (for cough at night)., Disp: 28 tablet, Rfl: 1 .  Vitamin D, Ergocalciferol, (DRISDOL) 1.25 MG (50000 UNIT) CAPS capsule, TAKE 1 CAPSULE (50,000 UNITS TOTAL) BY MOUTH EVERY 7 (SEVEN) DAYS. (Patient not taking: Reported on 06/06/2020), Disp: 12 capsule, Rfl: 2  EXAM: BP Readings from Last 3 Encounters:  06/06/20 130/76   02/14/20 (!) 143/77  02/07/20 134/73    VITALS per patient if applicable:  GENERAL: alert, oriented, appear no toxic but sick and tired  and in no acute distress intermittent coughing   And ur congestion   HEENT: atraumatic, conjunttiva clear, no obvious abnormalities on inspection of external nose and ears NECK: normal movements of the head and neck LUNGS: on inspection no signs of respiratory distress, breathing rate appears normal, no obvious gross SOB, gasping or wheezing  Bronchial cough  CV: no obvious cyanosis PSYCH/NEURO: pleasant and cooperative, no obvious depression or anxiety, speech and thought processing grossly intact   ASSESSMENT AND PLAN:  Discussed the following assessment and plan:    ICD-10-CM   1. Acute respiratory infection  J22   2. Left leg pain  M79.605    Looks still ill but non toxic close observation and rest indicated   No work until after weekend to [employer] ed if cp sob sx of pna otherwise add cough med for support  And rest over weekend  Prefer she keep appt  Next week regarding leg pain   If improved    She can call that am about how feeling and I well send a message to Brooke Le about this  She will get me forms about absence from work this week  Counseled.   Expectant management and discussion of plan and treatment with opportunity to ask questions and all were answered. The patient agreed with the plan and demonstrated an understanding of the instructions.  30 minutes time review and contact  Plans   Advised to call back or seek an in-person evaluation if worsening  or having  further concerns . Return if symptoms worsen or fail to improve as expected.    Brooke Ace, Le

## 2020-06-17 ENCOUNTER — Other Ambulatory Visit: Payer: Self-pay

## 2020-06-17 ENCOUNTER — Ambulatory Visit (INDEPENDENT_AMBULATORY_CARE_PROVIDER_SITE_OTHER): Payer: BC Managed Care – PPO

## 2020-06-17 ENCOUNTER — Ambulatory Visit: Payer: Self-pay

## 2020-06-17 ENCOUNTER — Ambulatory Visit (INDEPENDENT_AMBULATORY_CARE_PROVIDER_SITE_OTHER): Payer: BC Managed Care – PPO | Admitting: Family Medicine

## 2020-06-17 ENCOUNTER — Encounter: Payer: Self-pay | Admitting: Family Medicine

## 2020-06-17 VITALS — BP 110/74 | HR 66 | Ht 68.0 in | Wt 271.0 lb

## 2020-06-17 DIAGNOSIS — M79605 Pain in left leg: Secondary | ICD-10-CM

## 2020-06-17 DIAGNOSIS — M5416 Radiculopathy, lumbar region: Secondary | ICD-10-CM | POA: Diagnosis not present

## 2020-06-17 MED ORDER — GABAPENTIN 300 MG PO CAPS
300.0000 mg | ORAL_CAPSULE | Freq: Three times a day (TID) | ORAL | 3 refills | Status: DC | PRN
Start: 2020-06-17 — End: 2021-10-13

## 2020-06-17 NOTE — Patient Instructions (Signed)
Thank you for coming in today. Plan for xray today.  Try gabapentin.  If not improved let me know and I will plan for MRI lumbar spine.    Radicular Pain Radicular pain is a type of pain that spreads from your back or neck along a spinal nerve. Spinal nerves are nerves that leave the spinal cord and go to the muscles. Radicular pain is sometimes called radiculopathy, radiculitis, or a pinched nerve. When you have this type of pain, you may also have weakness, numbness, or tingling in the area of your body that is supplied by the nerve. The pain may feel sharp and burning. Depending on which spinal nerve is affected, the pain may occur in the:  Neck area (cervical radicular pain). You may also feel pain, numbness, weakness, or tingling in the arms.  Mid-spine area (thoracic radicular pain). You would feel this pain in the back and chest. This type is rare.  Lower back area (lumbar radicular pain). You would feel this pain as low back pain. You may feel pain, numbness, weakness, or tingling in the buttocks or legs. Sciatica is a type of lumbar radicular pain that shoots down the back of the leg. Radicular pain occurs when one of the spinal nerves becomes irritated or squeezed (compressed). It is often caused by something pushing on a spinal nerve, such as one of the bones of the spine (vertebrae) or one of the round cushions between vertebrae (intervertebral disks). This can result from:  An injury.  Wear and tear or aging of a disk.  The growth of a bone spur that pushes on the nerve. Radicular pain often goes away when you follow instructions from your health care provider for relieving pain at home. Follow these instructions at home: Managing pain      If directed, put ice on the affected area: ? Put ice in a plastic bag. ? Place a towel between your skin and the bag. ? Leave the ice on for 20 minutes, 2-3 times a day.  If directed, apply heat to the affected area as often as told  by your health care provider. Use the heat source that your health care provider recommends, such as a moist heat pack or a heating pad. ? Place a towel between your skin and the heat source. ? Leave the heat on for 20-30 minutes. ? Remove the heat if your skin turns bright red. This is especially important if you are unable to feel pain, heat, or cold. You may have a greater risk of getting burned. Activity   Do not sit or rest in bed for long periods of time.  Try to stay as active as possible. Ask your health care provider what type of exercise or activity is best for you.  Avoid activities that make your pain worse, such as bending and lifting.  Do not lift anything that is heavier than 10 lb (4.5 kg), or the limit that you are told, until your health care provider says that it is safe.  Practice using proper technique when lifting items. Proper lifting technique involves bending your knees and rising up.  Do strength and range-of-motion exercises only as told by your health care provider or physical therapist. General instructions  Take over-the-counter and prescription medicines only as told by your health care provider.  Pay attention to any changes in your symptoms.  Keep all follow-up visits as told by your health care provider. This is important. ? Your health care provider may  send you to a physical therapist to help with this pain. Contact a health care provider if:  Your pain and other symptoms get worse.  Your pain medicine is not helping.  Your pain has not improved after a few weeks of home care.  You have a fever. Get help right away if:  You have severe pain, weakness, or numbness.  You have difficulty with bladder or bowel control. Summary  Radicular pain is a type of pain that spreads from your back or neck along a spinal nerve.  When you have radicular pain, you may also have weakness, numbness, or tingling in the area of your body that is supplied by  the nerve.  The pain may feel sharp or burning.  Radicular pain may be treated with ice, heat, medicines, or physical therapy. This information is not intended to replace advice given to you by your health care provider. Make sure you discuss any questions you have with your health care provider. Document Revised: 06/13/2018 Document Reviewed: 06/13/2018 Elsevier Patient Education  [place].

## 2020-06-17 NOTE — Progress Notes (Signed)
Subjective:    I'm seeing this patient as a consultation for:  Dr. Regis Bill. Note will be routed back to referring provider/PCP.  CC: L leg pain and swelling  I, Judy Pimple, am serving as a Education administrator for Dr. [name].  HPI: Pt is a 54 y/o female presenting w/ c/o L leg pain and swelling that has been worsening since late June. States swelling is better today because she has been elevating her legs.  She locates her symptoms to Lateral L leg that will be stiff and swollen.  Radiating pain:no L leg swelling: yes Aggravating factors: walking or going up or down stairs  Treatments tried: elevation, OTC cream   Diagnostic imaging: L LE venous doppler- 06/06/20  Past medical history, Surgical history, Family history, Social history, Allergies, and medications have been entered into the medical record, reviewed.   Review of Systems: No new headache, visual changes, nausea, vomiting, diarrhea, constipation, dizziness, abdominal pain, skin rash, fevers, chills, night sweats, weight loss, swollen lymph nodes, body aches, joint swelling, muscle aches, chest pain, shortness of breath, mood changes, visual or auditory hallucinations.   Objective:    Vitals:   06/17/20 1456  BP: 110/74  Pulse: 66  SpO2: 98%   General: Well Developed, well nourished, and in no acute distress.  Neuro/Psych: Alert and oriented x3, extra-ocular muscles intact, able to move all 4 extremities, sensation grossly intact. Skin: Warm and dry, no rashes noted.  Respiratory: Not using accessory muscles, speaking in full sentences, trachea midline.  Cardiovascular: Pulses palpable, no extremity edema. Abdomen: Does not appear distended. MSK: Left leg nodular appearance anterior lower leg otherwise normal-appearing with no edema. Not particularly tender to palpation. Normal motion. Normal strength. Strength testing does not reproduce pain. L-spine normal-appearing nontender normal lumbar motion.  Lower extremity  strength is intact. Reflexes are intact. Positive left-sided slump test.  Lab and Radiology Results  X-ray images L-spine and left tib-fib obtained today personally and independently reviewed  L-spine: Mild degenerative changes no acute fracture.  Left tib-fib no abnormalities in area of concern.  Small osteophyte present at lateral malleolus  Await formal radiology review   Diagnostic Limited MSK Ultrasound of: Left tib-fib anterior Normal-appearing musculature at anterior lower leg. Normal bony cortex Impression: Normal lower extremity ultrasound  Report vascular ultrasound obtained June 25th reviewed showing no DVT or Baker's cyst.  Impression and Recommendations:    Assessment and Plan: 54 y.o. female with left anterior tibia pain.  Pain is in distribution of L5 nerve root and with positive slump test likely represents lumbar radiculopathy.  It is possible that she has some isolated orthopedic issue in this area however less likely based on normal x-ray ultrasound imaging.  Plan for trial of gabapentin and physical therapy.  If not improved next step is MRI.  Patient declined prednisone today noting significant side effects of this medication previously.[name]  PDMP not reviewed this encounter. Orders Placed This Encounter  Procedures  . [place] LIMITED JOINT SPACE STRUCTURES LOW LEFT    Standing Status:   Future    Number of Occurrences:   1    Standing Expiration Date:   06/17/2021    Order Specific Question:   Reason for Exam (SYMPTOM  OR DIAGNOSIS REQUIRED)    Answer:   Left leg pain    Order Specific Question:   Preferred imaging location?    Answer:   [facility]  . DG Tibia/Fibula Left    Standing Status:   Future  Number of Occurrences:   1    Standing Expiration Date:   06/17/2021    Order Specific Question:   Reason for Exam (SYMPTOM  OR DIAGNOSIS REQUIRED)    Answer:   eval left leg pain    Order Specific Question:   Is patient pregnant?     Answer:   No    Order Specific Question:   Preferred imaging location?    Answer:   [facility]    Order Specific Question:   Radiology Contrast Protocol - do NOT remove file path    Answer:   \\[email or web address]s.pdf  . DG Lumbar Spine 2-3 Views    Standing Status:   Future    Number of Occurrences:   1    Standing Expiration Date:   06/17/2021    Order Specific Question:   Reason for Exam (SYMPTOM  OR DIAGNOSIS REQUIRED)    Answer:   lumbar rad left L5    Order Specific Question:   Is patient pregnant?    Answer:   No    Order Specific Question:   Preferred imaging location?    Answer:   [facility]    Order Specific Question:   Radiology Contrast Protocol - do NOT remove file path    Answer:   \\[email or web address]\[email or web address]\DXFluoroContrastProtocols.pdf  . Ambulatory referral to Physical Therapy    Referral Priority:   Routine    Referral Type:   Physical Medicine    Referral Reason:   Specialty Services Required    Requested Specialty:   Physical Therapy   Meds ordered this encounter  Medications  . gabapentin (NEURONTIN) 300 MG capsule    Sig: Take 1 capsule (300 mg total) by mouth 3 (three) times daily as needed (nerve pain).    Dispense:  90 capsule    Refill:  3    Discussed warning signs or symptoms. Please see discharge instructions. Patient expresses understanding.   The above documentation has been reviewed and is accurate and complete [name], M.D.

## 2020-06-18 ENCOUNTER — Telehealth: Payer: Self-pay | Admitting: Internal Medicine

## 2020-06-18 NOTE — Telephone Encounter (Signed)
Called patient again and she stated that she is still weak and achy all over and she has a runny nose and sneezing. Patient is coughing but not as bad as it was. Patient does not have a fever she stated. Patient saw a foot doctor and stated that her O2 was good. Patient is asking to extend her out of work note until next Wednesday.  Please advise if xray needed and if OK to extend her work note.

## 2020-06-18 NOTE — Telephone Encounter (Signed)
See message answered  Other thread

## 2020-06-18 NOTE — Telephone Encounter (Signed)
Called patient and LMOVM to return call  Left a detailed voice message for patient to call us back to give [place] more details as to how she is feeling so we can further decide on next steps.

## 2020-06-18 NOTE — Telephone Encounter (Signed)
Ok to extend her  Note until Wednesday  Please order c xray at [facility]  Dx prolonged coghing  illness ( negative covid  Testing)

## 2020-06-18 NOTE — Progress Notes (Signed)
X-ray left shin looks normal

## 2020-06-18 NOTE — Progress Notes (Signed)
X-ray lumbar spine shows some mild arthritis

## 2020-06-18 NOTE — Telephone Encounter (Signed)
Please advise. MyChart message was forwarded this morning. Patient is still not feeling well.

## 2020-06-18 NOTE — Telephone Encounter (Signed)
Pt sent a message via Mychart this morning and would like to know what is the next step. Thanks

## 2020-06-18 NOTE — Telephone Encounter (Signed)
Need more information  Consider chest x ray  And fu visit

## 2020-06-19 ENCOUNTER — Other Ambulatory Visit: Payer: Self-pay

## 2020-06-19 DIAGNOSIS — R053 Chronic cough: Secondary | ICD-10-CM

## 2020-06-19 DIAGNOSIS — Z20822 Contact with and (suspected) exposure to covid-19: Secondary | ICD-10-CM

## 2020-06-19 NOTE — Telephone Encounter (Signed)
I have sent the patient a new work note and also ordered the xray and sent her a message to go have this done today if she can.

## 2020-06-27 NOTE — Telephone Encounter (Signed)
FORM completed and signed

## 2020-06-30 NOTE — Telephone Encounter (Signed)
Please get the chest x ray done  As orderd  And then  Plan fu visit this week

## 2020-07-03 ENCOUNTER — Ambulatory Visit: Payer: BC Managed Care – PPO | Attending: Family Medicine | Admitting: Physical Therapy

## 2020-08-01 ENCOUNTER — Encounter: Payer: Self-pay | Admitting: Internal Medicine

## 2020-08-01 ENCOUNTER — Ambulatory Visit (INDEPENDENT_AMBULATORY_CARE_PROVIDER_SITE_OTHER): Payer: BC Managed Care – PPO | Admitting: Internal Medicine

## 2020-08-01 ENCOUNTER — Other Ambulatory Visit: Payer: Self-pay

## 2020-08-01 VITALS — BP 138/84 | HR 56 | Temp 98.1°F | Ht 68.0 in | Wt 279.8 lb

## 2020-08-01 DIAGNOSIS — K629 Disease of anus and rectum, unspecified: Secondary | ICD-10-CM | POA: Diagnosis not present

## 2020-08-01 NOTE — Patient Instructions (Signed)
To return and reevaluate if not  Decreasing   In size or as needed

## 2020-08-01 NOTE — Progress Notes (Signed)
Chief Complaint  Patient presents with  . Mass    Lump near anus    HPI: Brooke Le 54 y.o. come in for  New problem  About a week ago noted itchy irritated  Notable lump near anus   Used prep h and now has no s  No pain no bleeding  ? If could be a hemorrhoid of something more important  ROS: See pertinent positives and negatives per HPI.  Past Medical History:  Diagnosis Date  . Allergic rhinitis   . Allergy   . Anemia, iron deficiency   . Anxiety   . Fibromyalgia   . Gynecological examination    Dr. Nori Riis  . HFWYOVZC(588.5) 08/21/2007   Qualifier: Diagnosis of  By: Regis Bill MD, Standley Brooking   . Hypertension    had decreased with weight loss  . Low vitamin B12 level   . Migraine headache    hx of seen in [facility] in the remote past  . Obesity   . 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 Syncope    ER evaluation in 2008; had anemia then    Family History  Problem Relation Age of Onset  . Alcohol abuse Other   . Diabetes Other   . Hypertension Other   . Prostate cancer Father   . Esophageal cancer Maternal Aunt   . Colon cancer Maternal Uncle   . Colon cancer Cousin   . Colon cancer Other   . Rectal cancer Neg Hx   . Stomach cancer Neg Hx     Social History   Socioeconomic History  . Marital status: Married    Spouse name: Not on file  . Number of children: Not on file  . Years of education: Not on file  . Highest education level: Not on file  Occupational History  . Not on file  Tobacco Use  . Smoking status: Never Smoker  . Smokeless tobacco: Never Used  Vaping Use  . Vaping Use: Never used  Substance and Sexual Activity  . Alcohol use: Yes    Comment: socially   . Drug use: Never  . Sexual activity: Not on file  Other Topics Concern  . Not on file  Social History Narrative   Married   Regular Exercise- no   Laid off [employer]- July 2011 lost insurance in [place]   Back  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    No pets             Social Determinants of Radio broadcast assistant Strain:   . Difficulty of Paying Living Expenses: Not on file  Food Insecurity:   . Worried About Charity fundraiser in the Last Year: Not on file  . Ran Out of Food in the Last Year: Not on file  Transportation Needs:   . Lack of Transportation (Medical): Not on file  . Lack of Transportation (Non-Medical): Not on file  Physical Activity:   . Days of Exercise per Week: Not on file  . Minutes of Exercise per Session: Not on file  Stress:   . Feeling of Stress : Not on file  Social Connections:   . Frequency of Communication with Friends and Family: Not on file  . Frequency of Social Gatherings with Friends and Family: Not on file  .  Attends Religious Services: Not on file  . Active Member of Clubs or Organizations: Not on file  . Attends Archivist Meetings: Not on file  . Marital Status: Not on file    Outpatient Medications Prior to Visit  Medication Sig Dispense Refill  . amitriptyline (ELAVIL) 10 MG tablet Take 2 tablets (20 mg total) by mouth at bedtime. 180 tablet 2  . ascorbic acid (VITAMIN C) 500 MG tablet Take 500 mg by mouth daily.    . Black Elderberry,Berry-Flower, 575 MG CAPS Take by mouth.    . gabapentin (NEURONTIN) 300 MG capsule Take 1 capsule (300 mg total) by mouth 3 (three) times daily as needed (nerve pain). 90 capsule 3  . hydrochlorothiazide (HYDRODIURIL) 25 MG tablet TAKE 1 TABLET BY MOUTH EVERY DAY 30 tablet 0  . ibuprofen (ADVIL) 800 MG tablet     . verapamil (CALAN-SR) 240 MG CR tablet TAKE 1 TABLET (240 MG TOTAL) BY MOUTH AT BEDTIME. 90 tablet 1  . vitamin B-12 (CYANOCOBALAMIN) 1000 MCG tablet Take 1,000 mcg by mouth daily.    . Vitamin D, Ergocalciferol, (DRISDOL) 1.25 MG (50000 UNIT) CAPS capsule TAKE 1 CAPSULE (50,000 UNITS TOTAL) BY MOUTH EVERY 7 (SEVEN) DAYS. 12 capsule 2    . Zinc 50 MG TABS Take 1 tablet by mouth daily.    . benzonatate (TESSALON PERLES) 100 MG capsule Take 1-2 capsules (100-200 mg total) by mouth 3 (three) times daily as needed for cough. (Patient not taking: Reported on 08/01/2020) 30 capsule 0  . Dextromethorphan-guaiFENesin (MUCINEX DM MAXIMUM STRENGTH) 60-1200 MG TB12 Take 1 tablet by mouth every 12 (twelve) hours as needed (for cough at night). (Patient not taking: Reported on 08/01/2020) 28 tablet 1   No facility-administered medications prior to visit.     EXAM:  BP 138/84   Pulse (!) 56   Temp 98.1 F (36.7 C) (Oral)   Ht 5\' 8"  (1.727 m)   Wt 279 lb 12.8 oz (126.9 kg)   LMP 10/04/2018 (Approximate)   BMI 42.54 kg/m   Body mass index is 42.54 kg/m.  GENERAL: vitals reviewed and listed above, alert, oriented, appears well hydrated and in no acute distress HEENT: atraumatic, conjunctiva  clear, no obvious abnormalities on inspection of external nose and ears OP :masked NECK: no obvious masses on inspection palpation   Ext anal area  3 mm  mm pale soft cystic feeling roundish  About 7 pm  Non tender and no bleeding  Or redness  PSYCH: pleasant and cooperative, no obvious depression or anxiety  BP Readings from Last 3 Encounters:  08/01/20 138/84  06/17/20 110/74  06/06/20 130/76    ASSESSMENT AND PLAN:  Discussed the following assessment and plan:  Anal lesion - feels cystic  poss hemorrhoidal  but pale color follow if not receding or  increasing in size in a month   Continue local prep h and tucks   And  If not going down in another month  Or growing then recheck reevaluate  -Patient advised to return or notify health care team  if  new concerns arise. Or not getting better  Patient Instructions  To return and reevaluate if not  Decreasing   In size or as needed     [name] K. [name] M.D.

## 2020-08-14 ENCOUNTER — Telehealth (INDEPENDENT_AMBULATORY_CARE_PROVIDER_SITE_OTHER): Payer: BC Managed Care – PPO | Admitting: Family Medicine

## 2020-08-14 ENCOUNTER — Encounter: Payer: Self-pay | Admitting: Family Medicine

## 2020-08-14 DIAGNOSIS — I10 Essential (primary) hypertension: Secondary | ICD-10-CM

## 2020-08-14 DIAGNOSIS — R519 Headache, unspecified: Secondary | ICD-10-CM | POA: Diagnosis not present

## 2020-08-14 DIAGNOSIS — Z8669 Personal history of other diseases of the nervous system and sense organs: Secondary | ICD-10-CM

## 2020-08-14 MED ORDER — ONDANSETRON 4 MG PO TBDP
4.0000 mg | ORAL_TABLET | Freq: Three times a day (TID) | ORAL | 0 refills | Status: DC | PRN
Start: 2020-08-14 — End: 2021-04-16

## 2020-08-14 NOTE — Patient Instructions (Signed)
-  take 2 tablets of Aleve and the zofran  -use and ice pack and get some rest  -if the treatment does not help or if you are getting worse seek in-person evaluation right away  -monitor BP if running over 140/80 seek follow up with your primary care doctor  -follow up next week with your doctor   WORK SLIP:  Patient Brooke Le,  09-25-1966, was seen for a medical visit today, 08/14/20. Please excuse from work today and tomorrow. Have advised referral for inperson evaluation if symptoms not resolved with treatment over the next 24 hours.   Sincerely: E-signature: Dr. Colin Benton, DO [place] Ph: [phone]

## 2020-08-14 NOTE — Progress Notes (Signed)
Virtual Visit via Video Note  I connected with Brooke Le  on 08/14/20 at  6:00 PM EDT by a video enabled telemedicine application and verified that I am speaking with the correct person using two identifiers.  Location patient: home, [place] Location provider:work or home office Persons participating in the virtual visit: patient, provider  I discussed the limitations of evaluation and management by telemedicine and the availability of in person appointments. The patient expressed understanding and agreed to proceed.   HPI:  Acute visit for headache: -started yesterday -symptoms include R sided throbbing headache - above the R eye, nausea, sensitivity to light and sound -has had these types of headaches before and was diagnosed with migraines, she usually has to stay in a dark, quiet room and uses compresses; sometimes -Excedrin helps, but has not aborted this headache -she is on elavil and verapamil daily -denies fevers, vision changes, weakness, numbness, worst ha, neck stiffness, cough, congestion, sore throat, vomiting, diarrhea -when asked about her BP she reports has been ok - mildly high "in the 140s"  ROS: See pertinent positives and negatives per HPI.  Past Medical History:  Diagnosis Date  . Allergic rhinitis   . Allergy   . Anemia, iron deficiency   . Anxiety   . Fibromyalgia   . Gynecological examination    Dr. Nori Riis  . [id number](518.8) 08/21/2007   Qualifier: Diagnosis of  By: Regis Bill MD, Standley Brooking   . Hypertension    had decreased with weight loss  . Low vitamin B12 level   . Migraine headache    hx of seen in [facility] in the remote past  . Obesity   . 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 Syncope    ER evaluation in 2008; had anemia then    Past Surgical History:  Procedure Laterality Date  . ADENOIDECTOMY    . ESOPHAGOGASTRODUODENOSCOPY  10/31/2002  . GASTRIC BYPASS    . TUBAL LIGATION    . UPPER  GASTROINTESTINAL ENDOSCOPY      Family History  Problem Relation Age of Onset  . Alcohol abuse Other   . Diabetes Other   . Hypertension Other   . Prostate cancer Father   . Esophageal cancer Maternal Aunt   . Colon cancer Maternal Uncle   . Colon cancer Cousin   . Colon cancer Other   . Rectal cancer Neg Hx   . Stomach cancer Neg Hx     SOCIAL HX: see hpi   Current Outpatient Medications:  .  amitriptyline (ELAVIL) 10 MG tablet, Take 2 tablets (20 mg total) by mouth at bedtime., Disp: 180 tablet, Rfl: 2 .  ascorbic acid (VITAMIN C) 500 MG tablet, Take 500 mg by mouth daily., Disp: , Rfl:  .  benzonatate (TESSALON PERLES) 100 MG capsule, Take 1-2 capsules (100-200 mg total) by mouth 3 (three) times daily as needed for cough., Disp: 30 capsule, Rfl: 0 .  Black Elderberry,Berry-Flower, 575 MG CAPS, Take by mouth., Disp: , Rfl:  .  Dextromethorphan-guaiFENesin (MUCINEX DM MAXIMUM STRENGTH) 60-1200 MG TB12, Take 1 tablet by mouth every 12 (twelve) hours as needed (for cough at night)., Disp: 28 tablet, Rfl: 1 .  gabapentin (NEURONTIN) 300 MG capsule, Take 1 capsule (300 mg total) by mouth 3 (three) times daily as needed (nerve pain)., Disp: 90 capsule, Rfl: 3 .  hydrochlorothiazide (HYDRODIURIL) 25 MG tablet, TAKE 1 TABLET BY MOUTH EVERY DAY, Disp: 30  tablet, Rfl: 0 .  verapamil (CALAN-SR) 240 MG CR tablet, TAKE 1 TABLET (240 MG TOTAL) BY MOUTH AT BEDTIME., Disp: 90 tablet, Rfl: 1 .  vitamin B-12 (CYANOCOBALAMIN) 1000 MCG tablet, Take 1,000 mcg by mouth daily., Disp: , Rfl:  .  Vitamin D, Ergocalciferol, (DRISDOL) 1.25 MG (50000 UNIT) CAPS capsule, TAKE 1 CAPSULE (50,000 UNITS TOTAL) BY MOUTH EVERY 7 (SEVEN) DAYS., Disp: 12 capsule, Rfl: 2 .  Zinc 50 MG TABS, Take 1 tablet by mouth daily., Disp: , Rfl:  .  ondansetron (ZOFRAN ODT) 4 MG disintegrating tablet, Take 1 tablet (4 mg total) by mouth every 8 (eight) hours as needed for nausea or vomiting., Disp: 20 tablet, Rfl:  0  EXAM:  VITALS per patient if applicable: does not have cuff currently to check, but can get one later  GENERAL: alert, oriented, appears well and in no acute distress  HEENT: atraumatic, conjunttiva clear, no obvious abnormalities on inspection of external nose and ears  NECK: normal movements of the head and neck  LUNGS: on inspection no signs of respiratory distress, breathing rate appears normal, no obvious gross SOB, gasping or wheezing  CV: no obvious cyanosis  MS: moves all visible extremities without noticeable abnormality  PSYCH/NEURO: pleasant and cooperative, no obvious depression or anxiety, speech and thought processing grossly intact  ASSESSMENT AND PLAN:  Discussed the following assessment and plan:  Nonintractable headache, unspecified chronicity pattern, unspecified headache type  History of migraine  Essential hypertension  -we discussed possible serious and likely etiologies, options for evaluation and workup, limitations of telemedicine visit vs in person visit, treatment, treatment risks and precautions. Pt prefers to treat via telemedicine empirically rather then risking or undertaking an in person visit at this moment. Query migraine given classic symptoms and her history of the same. Opted to try trial NSAID and antemetic. she opted to try OTC Aleve 2 tablets along with Rx for Zofran after discussion options. Discussed other causes of headaches and advised inperson evaluation if not effective. Discussed her BP but did not have current reading. Advised to monitor and to follow up promptly with PCP if running high - discussed BP goals. Advised follow up with PCP next week.  Work/School slipped offered: provided in patient instructions. Advised to seek prompt follow up telemedicine visit or in person care if worsening, new symptoms arise, or if is not improving with treatment.    I discussed the assessment and treatment plan with the patient. The patient was  provided an opportunity to ask questions and all were answered. The patient agreed with the plan and demonstrated an understanding of the instructions.    Lucretia Kern, DO

## 2020-08-19 ENCOUNTER — Encounter: Payer: Self-pay | Admitting: Internal Medicine

## 2020-08-19 ENCOUNTER — Telehealth (INDEPENDENT_AMBULATORY_CARE_PROVIDER_SITE_OTHER): Payer: BC Managed Care – PPO | Admitting: Internal Medicine

## 2020-08-19 ENCOUNTER — Other Ambulatory Visit: Payer: Self-pay

## 2020-08-19 VITALS — Temp 99.0°F | Ht 68.0 in | Wt 279.0 lb

## 2020-08-19 DIAGNOSIS — R509 Fever, unspecified: Secondary | ICD-10-CM

## 2020-08-19 DIAGNOSIS — R519 Headache, unspecified: Secondary | ICD-10-CM

## 2020-08-19 DIAGNOSIS — J989 Respiratory disorder, unspecified: Secondary | ICD-10-CM | POA: Diagnosis not present

## 2020-08-19 NOTE — Progress Notes (Signed)
Virtual Visit via Video Note  I connected with@ on 08/19/20 at 10:30 AM EDT by a video enabled telemedicine application and verified that I am speaking with the correct person using two identifiers. Location patient: home Location provider:work office Persons participating in the virtual visit: patient, provider  [name] See ( 2 dr Maudie Mercury for headaches ) she has a hx of migraines    Achy  Temp 101 over the  Weekend  Sept 4-5    And minor cough congestion  ( nota s bac as previous coughing illnesses )  Had death in family  And traveled to [place] in ? [place] service   No one helse got sick  masks up at work and no opther wirk exposures  Mild congestion  Feels badly  No sob  No rask ho active v d    ROS: See pertinent positives and negatives per HPI.  Past Medical History:  Diagnosis Date  . Allergic rhinitis   . Allergy   . Anemia, iron deficiency   . Anxiety   . Fibromyalgia   . Gynecological examination    Dr. Nori Riis  . [id number](664.4) 08/21/2007   Qualifier: Diagnosis of  By: Regis Bill MD, Standley Brooking   . Hypertension    had decreased with weight loss  . Low vitamin B12 level   . Migraine headache    hx of seen in [facility] in the remote past  . Obesity   . 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 Syncope    ER evaluation in 2008; had anemia then    Past Surgical History:  Procedure Laterality Date  . ADENOIDECTOMY    . ESOPHAGOGASTRODUODENOSCOPY  10/31/2002  . GASTRIC BYPASS    . TUBAL LIGATION    . UPPER GASTROINTESTINAL ENDOSCOPY      Family History  Problem Relation Age of Onset  . Alcohol abuse  Other   . Diabetes Other   . Hypertension Other   . Prostate cancer Father   . Esophageal cancer Maternal Aunt   . Colon cancer Maternal Uncle   . Colon cancer Cousin   . Colon cancer Other   . Rectal cancer Neg Hx   . Stomach cancer Neg Hx     Social History   Tobacco Use  . Smoking status: Never Smoker  . Smokeless tobacco: Never Used  Vaping Use  . Vaping Use: Never used  Substance Use Topics  . Alcohol use: Yes    Comment: socially   . Drug use: Never      Current Outpatient Medications:  .  amitriptyline (ELAVIL) 10 MG tablet, Take 2 tablets (20 mg total) by mouth at bedtime., Disp: 180 tablet, Rfl: 2 .  ascorbic acid (VITAMIN C) 500 MG tablet, Take 500 mg by mouth daily., Disp: , Rfl:  .  benzonatate (TESSALON PERLES) 100 MG capsule, Take 1-2 capsules (100-200 mg total) by mouth 3 (three) times daily as needed for cough., Disp: 30 capsule, Rfl: 0 .  Black Elderberry,Berry-Flower, 575 MG CAPS, Take by mouth., Disp: , Rfl:  .  Dextromethorphan-guaiFENesin (MUCINEX DM MAXIMUM STRENGTH) 60-1200 MG TB12,  Take 1 tablet by mouth every 12 (twelve) hours as needed (for cough at night)., Disp: 28 tablet, Rfl: 1 .  gabapentin (NEURONTIN) 300 MG capsule, Take 1 capsule (300 mg total) by mouth 3 (three) times daily as needed (nerve pain)., Disp: 90 capsule, Rfl: 3 .  hydrochlorothiazide (HYDRODIURIL) 25 MG tablet, TAKE 1 TABLET BY MOUTH EVERY DAY, Disp: 30 tablet, Rfl: 0 .  ondansetron (ZOFRAN ODT) 4 MG disintegrating tablet, Take 1 tablet (4 mg total) by mouth every 8 (eight) hours as needed for nausea or vomiting., Disp: 20 tablet, Rfl: 0 .  verapamil (CALAN-SR) 240 MG CR tablet, TAKE 1 TABLET (240 MG TOTAL) BY MOUTH AT BEDTIME., Disp: 90 tablet, Rfl: 1 .  vitamin B-12 (CYANOCOBALAMIN) 1000 MCG tablet, Take 1,000 mcg by mouth daily., Disp: , Rfl:  .  Vitamin D, Ergocalciferol, (DRISDOL) 1.25 MG (50000 UNIT) CAPS capsule, TAKE 1 CAPSULE (50,000 UNITS TOTAL) BY MOUTH EVERY 7 (SEVEN)  DAYS., Disp: 12 capsule, Rfl: 2 .  Zinc 50 MG TABS, Take 1 tablet by mouth daily., Disp: , Rfl:   EXAM: BP Readings from Last 3 Encounters:  08/01/20 138/84  06/17/20 110/74  06/06/20 130/76    VITALS per patient if applicable:  GENERAL: alert, oriented, appears   uncomfortable   Ill appearing non toxic  Holding head mild congestion   HEENT: atraumatic, conjunttiva clear, no obvious abnormalities on inspection of external nose and ears  NECK: normal movements of the head and neck  LUNGS: on inspection no signs of respiratory distress, breathing rate appears normal, no obvious gross SOB, gasping or wheezing ocass minopr cough   CV: no obvious cyanosis  PSYCH/NEURO:alert cooperative,  speech and thought processing grossly intact Lab Results  Component Value Date   WBC 5.1 06/06/2020   HGB 11.9 (L) 06/06/2020   HCT 35.8 (L) 06/06/2020   PLT 222.0 06/06/2020   GLUCOSE 94 06/06/2020   CHOL 173 10/22/2019   TRIG 63.0 10/22/2019   HDL 51.60 10/22/2019   LDLCALC 109 (H) 10/22/2019   ALT 15 06/06/2020   AST 17 06/06/2020   NA 142 06/06/2020   K 4.0 06/06/2020   CL 108 06/06/2020   CREATININE 1.06 06/06/2020   BUN 13 06/06/2020   CO2 27 06/06/2020   TSH 1.02 06/06/2020   HGBA1C 5.7 10/22/2019    ASSESSMENT AND PLAN:  Discussed the following assessment and plan:    ICD-10-CM   1. Febrile respiratory illness  J98.9    R50.9   2. Nonintractable headache, unspecified chronicity pattern, unspecified headache type  R51.9     Counseled.  Although has been vaccinated  fro covid 19 she has fever ha congestion mild  c/w covid   No tick bite ( may have had a bite on leg with travel but no rash or sx cw tick fever. At this time  Get covid tested and I would advise  MCA if positive testing  For her risk profile  To ed if alarm sx   Need to know how doing in 48 hours  .    Expectant management and discussion of plan and treatment with opportunity to ask questions and all were  answered. The patient agreed with the plan and demonstrated an understanding of the instructions.  review and  Counsel and plan  Advised to call back or seek an in-person evaluation if worsening  or having  further concerns . Return for depending on results  of covid testing and how doing  in 58  hours .    Shanon Ace, MD

## 2020-08-28 NOTE — Telephone Encounter (Signed)
Still sounds viral   If feer is gone good sign   If not continueing to improve after weekend or  Severe pain then plan follow up discussion

## 2020-09-01 ENCOUNTER — Other Ambulatory Visit: Payer: Self-pay

## 2020-09-01 MED ORDER — HYDROCHLOROTHIAZIDE 25 MG PO TABS: 25.0000 mg | ORAL_TABLET | Freq: Every day | ORAL | 5 refills | Status: DC

## 2020-09-01 NOTE — Telephone Encounter (Signed)
Ok to refill x 6 mos

## 2020-09-02 ENCOUNTER — Other Ambulatory Visit: Payer: Self-pay | Admitting: Internal Medicine

## 2020-09-03 NOTE — Telephone Encounter (Signed)
Form is on your desk  Please  Copy for scan .

## 2020-10-21 NOTE — Telephone Encounter (Signed)
I dont know what this means They need to identify what is missing on form  Have  patient   Fill out a copy of the form to see what info is missing and   Is required .  This is an Data processing manager not a medical  process .

## 2020-11-18 ENCOUNTER — Telehealth (INDEPENDENT_AMBULATORY_CARE_PROVIDER_SITE_OTHER): Payer: BC Managed Care – PPO | Admitting: Family Medicine

## 2020-11-18 ENCOUNTER — Other Ambulatory Visit: Payer: Self-pay

## 2020-11-18 DIAGNOSIS — M25562 Pain in left knee: Secondary | ICD-10-CM | POA: Diagnosis not present

## 2020-11-18 DIAGNOSIS — M79642 Pain in left hand: Secondary | ICD-10-CM | POA: Diagnosis not present

## 2020-11-18 DIAGNOSIS — G8929 Other chronic pain: Secondary | ICD-10-CM

## 2020-11-18 DIAGNOSIS — M25532 Pain in left wrist: Secondary | ICD-10-CM

## 2020-11-18 NOTE — Progress Notes (Signed)
Virtual Visit via Video Note  I connected with Brooke Le  on 11/18/20 at  4:00 PM EST by a video enabled telemedicine application and verified that I am speaking with the correct person using two identifiers.  Location patient: home, [place] Location provider:work or home office Persons participating in the virtual visit: patient, provider  I discussed the limitations of evaluation and management by telemedicine and the availability of in person appointments. The patient expressed understanding and agreed to proceed.   HPI:  Acute telemedicine visit for several issues:  1) L hand/wrist issues -right handed -started over 1 week ago -noticed lump on dorsal hand, hurts when moving fingers -types a lot -no redness, warmth, new activity or injury -she wants to see an orthopedic doc for this  2)knee issues: -chronic knee issues, L -reports saw [facility] [facility] in the past for this, most recently this summer -has had chronic issues with pain and intermittent swelling with this knee -she wants a referral to see ortho again  ROS: See pertinent positives and negatives per HPI.  Past Medical History:  Diagnosis Date  . Allergic rhinitis   . Allergy   . Anemia, iron deficiency   . Anxiety   . Fibromyalgia   . Gynecological examination    Dr. Nori Riis  . SWFUXNAT(557.3) 08/21/2007   Qualifier: Diagnosis of  By: Regis Bill MD, Standley Brooking   . Hypertension    had decreased with weight loss  . Low vitamin B12 level   . Migraine headache    hx of seen in [facility] in the remote past  . Obesity   . 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 Syncope    ER evaluation in 2008; had anemia then    Past Surgical History:  Procedure Laterality Date  . ADENOIDECTOMY    . ESOPHAGOGASTRODUODENOSCOPY  10/31/2002  . GASTRIC BYPASS    . TUBAL LIGATION    . UPPER GASTROINTESTINAL ENDOSCOPY       Current Outpatient Medications:  .  amitriptyline (ELAVIL) 10 MG  tablet, Take 2 tablets (20 mg total) by mouth at bedtime., Disp: 180 tablet, Rfl: 2 .  ascorbic acid (VITAMIN C) 500 MG tablet, Take 500 mg by mouth daily., Disp: , Rfl:  .  benzonatate (TESSALON PERLES) 100 MG capsule, Take 1-2 capsules (100-200 mg total) by mouth 3 (three) times daily as needed for cough., Disp: 30 capsule, Rfl: 0 .  Black Elderberry,Berry-Flower, 575 MG CAPS, Take by mouth., Disp: , Rfl:  .  Dextromethorphan-guaiFENesin (MUCINEX DM MAXIMUM STRENGTH) 60-1200 MG TB12, Take 1 tablet by mouth every 12 (twelve) hours as needed (for cough at night)., Disp: 28 tablet, Rfl: 1 .  gabapentin (NEURONTIN) 300 MG capsule, Take 1 capsule (300 mg total) by mouth 3 (three) times daily as needed (nerve pain)., Disp: 90 capsule, Rfl: 3 .  hydrochlorothiazide (HYDRODIURIL) 25 MG tablet, Take 1 tablet (25 mg total) by mouth daily., Disp: 30 tablet, Rfl: 5 .  ondansetron (ZOFRAN ODT) 4 MG disintegrating tablet, Take 1 tablet (4 mg total) by mouth every 8 (eight) hours as needed for nausea or vomiting., Disp: 20 tablet, Rfl: 0 .  verapamil (CALAN-SR) 240 MG CR tablet, TAKE 1 TABLET BY MOUTH AT BEDTIME., Disp: 90 tablet, Rfl: 1 .  vitamin B-12 (CYANOCOBALAMIN) 1000 MCG tablet, Take 1,000 mcg by mouth daily., Disp: , Rfl:  .  Vitamin D, Ergocalciferol, (DRISDOL) 1.25 MG (50000 UNIT) CAPS capsule, TAKE  1 CAPSULE (50,000 UNITS TOTAL) BY MOUTH EVERY 7 (SEVEN) DAYS., Disp: 12 capsule, Rfl: 2 .  Zinc 50 MG TABS, Take 1 tablet by mouth daily., Disp: , Rfl:   EXAM:  VITALS per patient if applicable:  GENERAL: alert, oriented, appears well and in no acute distress  HEENT: atraumatic, conjunttiva clear, no obvious abnormalities on inspection of external nose and ears  NECK: normal movements of the head and neck  LUNGS: on inspection no signs of respiratory distress, breathing rate appears normal, no obvious gross SOB, gasping or wheezing  CV: no obvious cyanosis  MS: moves all visible extremities  without noticeable abnormality - poor video visit quality and lighting - but I could not appreciated swelling or lump on visual inspection, she points L dorsal hand and wrist as area of lump/pain, has full ROM in ext/flexion/rotation wrist, is able to flex and extend fingers with reported catching or locking  PSYCH/NEURO: pleasant and cooperative, no obvious depression or anxiety, speech and thought processing grossly intact  ASSESSMENT AND PLAN:  Discussed the following assessment and plan:  Left wrist pain  Pain of left hand  Chronic pain of left knee  -we discussed possible serious and likely etiologies, options for evaluation and workup, limitations of telemedicine visit vs in person visit, treatment, treatment risks and precautions for each of her concerns. Pt prefers to treat via telemedicine empirically rather than in person at this moment as she primarily wants referral to specialist. Advised since saw specialist recently for the knee would likely not need a referral for follow up and she plans to call her specialist for this. For the acute hand/wrist issues discussed options and she prefers to try orthopedic urgent care tomorrow and wants to try ice/analgesic in interim.    I discussed the assessment and treatment plan with the patient. The patient was provided an opportunity to ask questions and all were answered. The patient agreed with the plan and demonstrated an understanding of the instructions.     Brooke Kern, DO

## 2020-11-24 ENCOUNTER — Ambulatory Visit: Payer: BC Managed Care – PPO | Admitting: Internal Medicine

## 2020-11-24 NOTE — Progress Notes (Deleted)
Name: Brooke Le  MRN/ DOB: [id number], [birth year]    Age/ Sex: 54 y.o., female     PCP: Burnis Medin, MD   Reason for Endocrinology Evaluation: MNG/Elevated Alk Phos      Initial Endocrinology Clinic Visit: 11/27/2019    PATIENT IDENTIFIER: Brooke Le is a 54 y.o., female with a past medical history of HTN and Hx of gastric bypass . She has followed with [facility] since 11/27/2019 for consultative assistance with management of her MNG/Elevated Alk. Phos .   THYROID  SUMMARY: Pt was diagnosed with MNG in 11/2019 based on ultrasound results. Unclear to me what prompted the ultrasound.  She had an ultrasound in 11/2019 with multiple bilateral nodules. She is S/P benign FNA of the left inferior nodule 2.9 cm on 12/19/2019     ALKALINE PHOSPHATASE HISTORY: Pt was noted to have elevated alkaline phosphatase since 2009, this has been fluctuating in nature   Alkaline Phosphatase isozymes showed normal results with  Bone fraction less then hepatic fraction at 25 vs 72 %  She has a hx of vitamin D deficiency  With a nadir of 15.85 ng/mL in 2018 Normal DXA 11/2019 TSh and calcium normal   Maternal aunt with thyroid disease.   SUBJECTIVE:     Today (11/24/2020):  Brooke Le is here for ****    HISTORY:  Past Medical History:  Past Medical History:  Diagnosis Date  . Allergic rhinitis   . Allergy   . Anemia, iron deficiency   . Anxiety   . Fibromyalgia   . Gynecological examination    Dr. Nori Riis  . [id number](915.0) 08/21/2007   Qualifier: Diagnosis of  By: Regis Bill MD, Standley Brooking   . Hypertension    had decreased with weight loss  . Low vitamin B12 level   . Migraine headache    hx of seen in [facility] in the remote past  . Obesity   . 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 Syncope    ER evaluation in 2008; had anemia then    Past Surgical History:  Past  Surgical History:  Procedure Laterality Date  . ADENOIDECTOMY    . ESOPHAGOGASTRODUODENOSCOPY  10/31/2002  . GASTRIC BYPASS    . TUBAL LIGATION    . UPPER GASTROINTESTINAL ENDOSCOPY       Social History:  reports that she has never smoked. She has never used smokeless tobacco. She reports current alcohol use. She reports that she does not use drugs. Family History:  Family History  Problem Relation Age of Onset  . Alcohol abuse Other   . Diabetes Other   . Hypertension Other   . Prostate cancer Father   . Esophageal cancer Maternal Aunt   . Colon cancer Maternal Uncle   . Colon cancer Cousin   . Colon cancer Other   . Rectal cancer Neg Hx   . Stomach cancer Neg Hx       HOME MEDICATIONS: Allergies as of 11/24/2020      Reactions   Bee Pollen Swelling      Medication List       Accurate as of November 24, 2020  7:20 AM. If you have any questions, ask your nurse or doctor.        amitriptyline 10 MG tablet Commonly known as: ELAVIL Take 2 tablets (20 mg total) by mouth at bedtime.   ascorbic acid 500  MG tablet Commonly known as: VITAMIN C Take 500 mg by mouth daily.   benzonatate 100 MG capsule Commonly known as: Tessalon Perles Take 1-2 capsules (100-200 mg total) by mouth 3 (three) times daily as needed for cough.   Black Elderberry(Berry-Flower) 575 MG Caps Take by mouth.   gabapentin 300 MG capsule Commonly known as: NEURONTIN Take 1 capsule (300 mg total) by mouth 3 (three) times daily as needed (nerve pain).   hydrochlorothiazide 25 MG tablet Commonly known as: HYDRODIURIL Take 1 tablet (25 mg total) by mouth daily.   Mucinex DM Maximum Strength 60-1200 MG Tb12 Take 1 tablet by mouth every 12 (twelve) hours as needed (for cough at night).   ondansetron 4 MG disintegrating tablet Commonly known as: Zofran ODT Take 1 tablet (4 mg total) by mouth every 8 (eight) hours as needed for nausea or vomiting.   verapamil 240 MG CR tablet Commonly known  as: CALAN-SR TAKE 1 TABLET BY MOUTH AT BEDTIME.   vitamin B-12 1000 MCG tablet Commonly known as: CYANOCOBALAMIN Take 1,000 mcg by mouth daily.   Vitamin D (Ergocalciferol) 1.25 MG (50000 UNIT) Caps capsule Commonly known as: DRISDOL TAKE 1 CAPSULE (50,000 UNITS TOTAL) BY MOUTH EVERY 7 (SEVEN) DAYS.   Zinc 50 MG Tabs Take 1 tablet by mouth daily.         OBJECTIVE:   PHYSICAL EXAM: VS: LMP 10/04/2018 (Approximate)    EXAM: General: Pt appears well and is in NAD  Neck: General: Supple without adenopathy. Thyroid: Thyroid size normal.  No goiter or nodules appreciated.  Lungs: Clear with good BS bilat with no rales, rhonchi, or wheezes  Heart: Auscultation: RRR.  Abdomen: Normoactive bowel sounds, soft, nontender, without masses or organomegaly palpable  Extremities:  BL LE: No pretibial edema normal ROM and strength.  Mental Status: Judgment, insight: Intact Orientation: Oriented to time, place, and person Mood and affect: No depression, anxiety, or agitation     DATA REVIEWED: ***    ASSESSMENT / PLAN / RECOMMENDATIONS:   1. ***  Plan:  ***    Medications   ***   Signed electronically by: Mack Guise, MD  [facility]  [facility] [address] [place], [place] [address] Phone: [phone] FAX: [phone]      CC: Burnis Medin, [place] [address] Phone: [phone]  Fax: [phone]   Return to Endocrinology clinic as below: Future Appointments  Date Time Provider [place]  11/24/2020 10:30 AM Makaelah Cranfield, Melanie Crazier, MD [id number] None

## 2020-11-25 ENCOUNTER — Ambulatory Visit: Payer: BC Managed Care – PPO | Admitting: Internal Medicine

## 2020-12-02 NOTE — Progress Notes (Signed)
I, Brooke Le, LAT, ATC, am serving as scribe for Dr. [name].  Brooke Le is a 53 y.o. female who presents to [place] at [facility] today for f/u of L leg/knee pain and new L hand/wrist pain.  She was last seen by Dr. Georgina Le on 06/17/20 for her L leg pain and swelling and was referred to PT.  She was also prescribed Gabapentin.  Since her last visit, she reports L ant knee pain x 2-3 weeks w/ some L knee swelling.  She also reports some L leg swelling.  Aggravating factors include walking and L LE weight bearing.  She has to do a lot of standing for work and has recently added a 2nd job working at [employer].  This job started October.  She usually uses her right arm mostly so does not think that is causing her left hand pain.  L hand/wrist pain: Reports pain x approximately one month.  She notes a "bump/lump" at her L dorsal wrist.  -Radiating pain: yes into her L forearm -Swelling: yes -Aggravating factors: L wrist ext; typing -Treatments tried: ice; menthol glove   Pertinent review of systems: No fevers or chills  Relevant historical information: Hypertension.  No personal or family history of rheumatologic disorders   Exam:  BP (!) 164/100 (BP Location: Right Arm, Patient Position: Sitting, Cuff Size: Large)   Ht 5\' 8"  (1.727 m)   Wt 286 lb 9.6 oz (130 kg)   LMP 10/04/2018 (Approximate)   BMI 43.58 kg/m  General: Well Developed, well nourished, and in no acute distress.   MSK: Left wrist swollen dorsal wrist and hand.  Otherwise normal.  Normal hand and wrist motion.  Tender palpation dorsal hand and wrist.  Strength intact.  Pulses cap refill and sensation intact distally.  Left knee normal-appearing normal motion. Nontender. Stable ligaments exam. Intact strength.    Lab and Radiology Results  Diagnostic Limited MSK Ultrasound of: Left wrist and knee Left wrist: Hypoechoic fluid tracks in fourth dorsal wrist compartment consistent  with tenosynovitis. Left knee moderate effusion.  Narrowed joint lines medial and laterally with partially extruded lateral meniscus.  Small Baker's cyst present Impression: Fourth dorsal wrist compartment tenosynovitis wrist. 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     Assessment and Plan: 54 y.o. female with left wrist pain.  Etiology somewhat unclear.  Patient does have fourth dorsal wrist compartment tenosynovitis but does not have a clear cause why.  I think it is probably her typing.  I suspect she has an ergonomic issue at work.  We discussed some options.  Plan for trial of prednisone and Voltaren gel.  She will look at her life a little more carefully and see if she is doing simply habitually with her left hand that could be causing this pain.  If not better would consider injection.  Knee pain: Due to exacerbation of DJD.  She has been doing more activity with her second job at [employer].  As she is giving prednisone for the rest that should help the knee as well.  Also Voltaren gel should help.  She is currently out of work at [employer] will be happy to fill out forms if needed.  Proceed with steroid injection if needed.     PDMP not reviewed this encounter. Orders Placed This Encounter  Procedures  . [place] LIMITED JOINT SPACE STRUCTURES LOW LEFT(NO LINKED CHARGES)    Order Specific Question:   Reason for Exam (SYMPTOM  OR DIAGNOSIS REQUIRED)    Answer:   L knee pain    Order Specific Question:   Preferred imaging location?    Answer:   [facility] ordered this encounter  Medications  . predniSONE (STERAPRED UNI-PAK 48 TAB) 10 MG (48) TBPK tablet    Sig: Take by mouth daily. 12 day dosepack po    Dispense:  48 tablet    Refill:  0     Discussed warning signs or symptoms. Please see discharge instructions. Patient expresses understanding.   The above documentation has been reviewed and is accurate and complete Brooke Le,  M.D.

## 2020-12-03 ENCOUNTER — Other Ambulatory Visit: Payer: Self-pay

## 2020-12-03 ENCOUNTER — Ambulatory Visit: Payer: Self-pay

## 2020-12-03 ENCOUNTER — Encounter: Payer: Self-pay | Admitting: Family Medicine

## 2020-12-03 ENCOUNTER — Ambulatory Visit (INDEPENDENT_AMBULATORY_CARE_PROVIDER_SITE_OTHER): Payer: BC Managed Care – PPO | Admitting: Family Medicine

## 2020-12-03 VITALS — BP 164/100 | Ht 68.0 in | Wt 286.6 lb

## 2020-12-03 DIAGNOSIS — M25532 Pain in left wrist: Secondary | ICD-10-CM | POA: Diagnosis not present

## 2020-12-03 DIAGNOSIS — M25562 Pain in left knee: Secondary | ICD-10-CM

## 2020-12-03 DIAGNOSIS — G8929 Other chronic pain: Secondary | ICD-10-CM | POA: Diagnosis not present

## 2020-12-03 MED ORDER — PREDNISONE 10 MG (48) PO TBPK: ORAL_TABLET | Freq: Every day | ORAL | 0 refills | Status: DC

## 2020-12-03 NOTE — Patient Instructions (Signed)
Thank you for coming in today.  Use voltaren gel on the wrist and knee.   Take the prednisone.   If not better in 2-4 weeks let me know.   Look at your life to see if you are doing something habitually with your left hand.   Keep me updated.   Happy to forms for [employer] (second job).

## 2020-12-29 ENCOUNTER — Encounter: Payer: Self-pay | Admitting: Family Medicine

## 2020-12-30 NOTE — Progress Notes (Signed)
   I, Peterson Lombard, LAT, ATC acting as a scribe for [name], MD.  Brooke Le is a 55 y.o. female who presents to [place] at [facility] today for continued L wrist/hand pain. Pt was last seen by Dr. Georgina Snell on 12/03/20 and was advised to try prednisone, Voltaren gel, and evaluate her ergonomics at work. L wrist/hand pain has been ongoing for approximately 2 months. Pt locates pain to L dorsal aspect of wrist/metacarpals and notes a bump along the dorsal aspect. Today, pt reports pain and bump goes away and comes back. Pt reports she's been taking prednisone, but less amount and less frequently than as prescribed.  Radiating pain: yes into her L forearm Swelling: yes Aggravating factors: L wrist ext, typing Treatments tried: ice, menthol glove  Pertinent review of systems: No fevers or chills  Relevant historical information: Hypertension.   Exam:  BP 110/80 (BP Location: Right Arm, Patient Position: Sitting, Cuff Size: Large)   Pulse 78   Ht 5\' 8"  (1.727 m)   Wt 282 lb 4.8 oz (128.1 kg)   LMP 10/04/2018 (Approximate)   SpO2 98%   BMI 42.92 kg/m  General: Well Developed, well nourished, and in no acute distress.   MSK: Left wrist: Mild swelling to dorsal aspect of wrist.  Tender palpation dorsal wrist.  Normal wrist motion.  Strength intact.  Pulses capillary fill and sensation are intact.    Lab and Radiology Results  Diagnostic Limited MSK Ultrasound of: Left wrist Hypoechoic fluid tracks within fourth dorsal wrist compartment consistent with tenosynovitis.  Otherwise wrist looks normal appearing to ultrasound examination. Impression: Left wrist dorsal tenosynovitis.      Assessment and Plan: 55 y.o. female with left wrist pain thought to be tenosynovitis.  Failing early conservative management trial.  Discussed options.  Patient elects for trial of physical therapy with hand therapy.  Could consider injection however patient would like  to try hand PT first.  Recheck back after PT trial if not improved would proceed with injection then.   PDMP not reviewed this encounter. Orders Placed This Encounter  Procedures  . [place] LIMITED JOINT SPACE STRUCTURES UP LEFT(NO LINKED CHARGES)    Standing Status:   Future    Number of Occurrences:   1    Standing Expiration Date:   06/30/2021    Order Specific Question:   Reason for Exam (SYMPTOM  OR DIAGNOSIS REQUIRED)    Answer:   wrist pain    Order Specific Question:   Preferred imaging location?    Answer:   [facility]  . Ambulatory referral to Physical Therapy    Referral Priority:   Routine    Referral Type:   Physical Medicine    Referral Reason:   Specialty Services Required    Requested Specialty:   Physical Therapy    Number of Visits Requested:   1   No orders of the defined types were placed in this encounter.    Discussed warning signs or symptoms. Please see discharge instructions. Patient expresses understanding.   The above documentation has been reviewed and is accurate and complete [name], M.D.

## 2020-12-31 ENCOUNTER — Other Ambulatory Visit: Payer: Self-pay

## 2020-12-31 ENCOUNTER — Ambulatory Visit: Payer: Self-pay

## 2020-12-31 ENCOUNTER — Ambulatory Visit (INDEPENDENT_AMBULATORY_CARE_PROVIDER_SITE_OTHER): Payer: BC Managed Care – PPO | Admitting: Family Medicine

## 2020-12-31 VITALS — BP 110/80 | HR 78 | Ht 68.0 in | Wt 282.3 lb

## 2020-12-31 DIAGNOSIS — M25532 Pain in left wrist: Secondary | ICD-10-CM | POA: Diagnosis not present

## 2020-12-31 DIAGNOSIS — M778 Other enthesopathies, not elsewhere classified: Secondary | ICD-10-CM | POA: Diagnosis not present

## 2020-12-31 NOTE — Patient Instructions (Signed)
Thank you for coming in today.  Let do hand PT.   Work on Personal assistant.   If not better let me know and we do a shot.   Recheck as needed.

## 2021-01-01 ENCOUNTER — Encounter: Payer: Self-pay | Admitting: Family Medicine

## 2021-01-14 ENCOUNTER — Encounter: Payer: Self-pay | Admitting: Family Medicine

## 2021-03-06 ENCOUNTER — Other Ambulatory Visit: Payer: Self-pay | Admitting: Internal Medicine

## 2021-04-13 NOTE — Telephone Encounter (Signed)
Hope you are feeling  Better. Isolation is still 10 days from onset of symptoms ( not the test)  And then return.  If you need  More help evaluation  Make video  Virtual visit

## 2021-04-15 ENCOUNTER — Telehealth: Payer: BC Managed Care – PPO | Admitting: Physician Assistant

## 2021-04-15 ENCOUNTER — Telehealth: Payer: BC Managed Care – PPO | Admitting: Nurse Practitioner

## 2021-04-15 DIAGNOSIS — U071 COVID-19: Secondary | ICD-10-CM

## 2021-04-15 MED ORDER — NAPROXEN 500 MG PO TABS
500.0000 mg | ORAL_TABLET | Freq: Two times a day (BID) | ORAL | 0 refills | Status: DC
Start: 2021-04-15 — End: 2022-10-05

## 2021-04-15 NOTE — Progress Notes (Signed)
E-Visit for Positive Covid Test Result We are sorry you are not feeling well. We are here to help!  You have tested positive for COVID-19, meaning that you were infected with the novel coronavirus and could give the virus to others.  It is vitally important that you stay home so you do not spread it to others.      Please continue isolation at home, for at least 10 days since the start of your symptoms and until you have had 24 hours with no fever (without taking a fever reducer) and with improving of symptoms.  If you have no symptoms but tested positive (or all symptoms resolve after 5 days and you have no fever) you can leave your house but continue to wear a mask around others for an additional 5 days. If you have a fever,continue to stay home until you have had 24 hours of no fever. Most cases improve 5-10 days from onset but we have seen a small number of patients who have gotten worse after the 10 days.  Please be sure to watch for worsening symptoms and remain taking the proper precautions.   Go to the nearest hospital ED for assessment if fever/cough/breathlessness are severe or illness seems like a threat to life.    The following symptoms may appear 2-14 days after exposure: . Fever . Cough . Shortness of breath or difficulty breathing . Chills . Repeated shaking with chills . Muscle pain . Headache . Sore throat . New loss of taste or smell . Fatigue . Congestion or runny nose . Nausea or vomiting . Diarrhea  You have been enrolled in [place] for COVID-19. Daily you will receive a questionnaire within the [place] website. Our COVID-19 response team will be monitoring your responses daily.  You can use medication such as prescription anti-inflammatory called Naprosyn 500 mg. Take twice daily as needed for fever or body aches for 2 weeks  You may also take acetaminophen (Tylenol) as needed for fever.  HOME CARE: . Only take medications as instructed by your  medical team. . Drink plenty of fluids and get plenty of rest. . A steam or ultrasonic humidifier can help if you have congestion.   GET HELP RIGHT AWAY IF YOU HAVE EMERGENCY WARNING SIGNS.  Call 911 or proceed to your closest emergency facility if: . You develop worsening high fever. . Trouble breathing . Bluish lips or face . Persistent pain or pressure in the chest . New confusion . Inability to wake or stay awake . You cough up blood. . Your symptoms become more severe . Inability to hold down food or fluids  This list is not all possible symptoms. Contact your medical provider for any symptoms that are severe or concerning to you.    Your e-visit answers were reviewed by a board certified advanced clinical practitioner to complete your personal care plan.  Depending on the condition, your plan could have included both over the counter or prescription medications.  If there is a problem please reply once you have received a response from your provider.  Your safety is important to [place].  If you have drug allergies check your prescription carefully.    You can use MyChart to ask questions about today's visit, request a non-urgent call back, or ask for a work or school excuse for 24 hours related to this e-Visit. If it has been greater than 24 hours you will need to follow up with your provider, or enter a  new e-Visit to address those concerns. You will get an e-mail in the next two days asking about your experience.  I hope that your e-visit has been valuable and will speed your recovery. Thank you for using e-visits.      5-10 minutes spent reviewing and documenting in chart.

## 2021-04-16 ENCOUNTER — Other Ambulatory Visit: Payer: Self-pay

## 2021-04-16 ENCOUNTER — Telehealth (INDEPENDENT_AMBULATORY_CARE_PROVIDER_SITE_OTHER): Payer: BC Managed Care – PPO | Admitting: Internal Medicine

## 2021-04-16 DIAGNOSIS — U071 COVID-19: Secondary | ICD-10-CM

## 2021-04-16 NOTE — Progress Notes (Signed)
Virtual Visit via Video Note  I connected with@ on 04/16/21 at 10:00 AM EDT by a video enabled telemedicine application and verified that I am speaking with the correct person using two identifiers. Location patient: home Location provider: home office Persons participating in the virtual visit: patient, provider  [name]  Began having symptoms on the 25th most likely chills low-grade fever body aches upper respiratory congestion runny nose.  Last day of work was April 26 tested positive for COVID on the 27th at [place].  She is remained home and isolated since that that time until today as return to work after 10 days of isolation. She did have an ED visit yesterday having serious sinus congestion and pressure was using some medication they gave her and she is feeling better today. No fever chest pain shortness of breath but continued nasal congestion allergic like. She had had Moderna initial vaccine series and the booster in July 2021. Others in her family got this but uncertain which contact with contagious. States her blood pressure is doing well. 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  ROS: See pertinent positives and negatives per HPI.  Past Medical History:  Diagnosis Date  . Allergic rhinitis   . Allergy   . Anemia, iron deficiency   . Anxiety   . Fibromyalgia   . Gynecological examination    Dr. Nori Riis  . [id number](833.8) 08/21/2007   Qualifier: Diagnosis of  By: Regis Bill MD, Standley Brooking   . Hypertension    had  decreased with weight loss  . Low vitamin B12 level   . Migraine headache    hx of seen in [facility] in the remote past  . Obesity   . 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 Syncope    ER evaluation in 2008; had anemia then    Past Surgical History:  Procedure Laterality Date  . ADENOIDECTOMY    . ESOPHAGOGASTRODUODENOSCOPY  10/31/2002  . GASTRIC BYPASS    . TUBAL LIGATION    . UPPER GASTROINTESTINAL ENDOSCOPY      Family History  Problem Relation Age of Onset  . Alcohol abuse Other   . Diabetes Other   . Hypertension Other   . Prostate cancer Father   . Esophageal cancer Maternal Aunt   . Colon cancer Maternal Uncle   . Colon cancer Cousin   . Colon cancer Other   . Rectal cancer Neg Hx   . Stomach cancer Neg Hx     Social History   Tobacco Use  . Smoking status: Never Smoker  . Smokeless tobacco: Never Used  Vaping Use  . Vaping Use: Never used  Substance Use Topics  . Alcohol use: Yes    Comment: socially   . Drug use: Never      Current Outpatient Medications:  .  amitriptyline (ELAVIL) 10 MG tablet, Take 2 tablets (20 mg total) by mouth at bedtime., Disp: 180 tablet, Rfl: 2 .  ascorbic  acid (VITAMIN C) 500 MG tablet, Take 500 mg by mouth daily., Disp: , Rfl:  .  Dextromethorphan-guaiFENesin (MUCINEX DM MAXIMUM STRENGTH) 60-1200 MG TB12, Take 1 tablet by mouth every 12 (twelve) hours as needed (for cough at night)., Disp: 28 tablet, Rfl: 1 .  gabapentin (NEURONTIN) 300 MG capsule, Take 1 capsule (300 mg total) by mouth 3 (three) times daily as needed (nerve pain)., Disp: 90 capsule, Rfl: 3 .  hydrochlorothiazide (HYDRODIURIL) 25 MG tablet, TAKE 1 TABLET BY MOUTH EVERY DAY, Disp: 90 tablet, Rfl: 0 .  naproxen (NAPROSYN) 500 MG tablet, Take 1 tablet (500 mg total) by mouth 2 (two) times daily with a meal., Disp: 30 tablet, Rfl: 0 .  verapamil (CALAN-SR) 240 MG CR tablet, TAKE 1 TABLET BY MOUTH EVERYDAY  AT BEDTIME, Disp: 90 tablet, Rfl: 0 .  vitamin B-12 (CYANOCOBALAMIN) 1000 MCG tablet, Take 1,000 mcg by mouth daily., Disp: , Rfl:  .  Vitamin D, Ergocalciferol, (DRISDOL) 1.25 MG (50000 UNIT) CAPS capsule, TAKE 1 CAPSULE (50,000 UNITS TOTAL) BY MOUTH EVERY 7 (SEVEN) DAYS., Disp: 12 capsule, Rfl: 2 .  Zinc 50 MG TABS, Take 1 tablet by mouth daily., Disp: , Rfl:   EXAM: BP Readings from Last 3 Encounters:  12/31/20 110/80  12/03/20 (!) 164/100  08/01/20 138/84    VITALS per patient if applicable:  GENERAL: alert, oriented, appears well and in no acute distress nontoxic but quite congested no respiratory distress.  HEENT: atraumatic, conjunttiva clear, no obvious abnormalities on inspection of external nose and ears  NECK: normal movements of the head and neck  LUNGS: on inspection no signs of respiratory distress, breathing rate appears normal, no obvious gross SOB, gasping or wheezing  CV: no obvious cyanosis  MS: moves all visible extremities without noticeable abnormality  PSYCH/NEURO: pleasant and cooperative, no obvious depression or anxiety, speech and thought processing grossly intact   ASSESSMENT AND PLAN:  Discussed the following assessment and plan:    ICD-10-CM   1. COVID-19 virus infection  U07.1    immunized and boosted moderna. no obv complications . residual ur sinus sx    Okay to return to work we will complete forms that she was out of work from the April  25th to May 4.  Will  Do paper work when sent .  Recheck prn worsning alarm sx   Counseled.   Expectant management and discussion of plan and treatment with opportunity to ask questions and all were answered. The patient agreed with the plan and demonstrated an understanding of the instructions.   Advised to call back or seek an in-person evaluation if worsening  or having  further concerns . Return if symptoms worsen or fail to improve.    Shanon Ace, MD

## 2021-04-20 NOTE — Telephone Encounter (Signed)
Form has been faxed to [phone]. Mychart message has been sent to the patient informing her that the forms have been completed and faxed.

## 2021-04-20 NOTE — Telephone Encounter (Signed)
I have completed and signed the form. Please copy for our records Fax as per patient request. Placed on your desk. Inform patient when completed.

## 2021-04-30 ENCOUNTER — Other Ambulatory Visit: Payer: Self-pay

## 2021-04-30 ENCOUNTER — Encounter: Payer: Self-pay | Admitting: Family Medicine

## 2021-04-30 ENCOUNTER — Ambulatory Visit: Payer: BC Managed Care – PPO | Admitting: Family Medicine

## 2021-04-30 VITALS — BP 140/96 | HR 58 | Temp 98.6°F | Wt 276.6 lb

## 2021-04-30 DIAGNOSIS — J019 Acute sinusitis, unspecified: Secondary | ICD-10-CM | POA: Diagnosis not present

## 2021-04-30 MED ORDER — AZITHROMYCIN 250 MG PO TABS: ORAL_TABLET | ORAL | 0 refills | Status: DC

## 2021-04-30 NOTE — Progress Notes (Signed)
   Subjective:    Patient ID: Brooke Le, female    DOB: [birth year], 55 y.o.   MRN: [id number]  HPI Here for 2 weeks of sinus pressure blowing green mucus from the nose, and PND. No cough or fever. Using Mucinex and nasal sprays. She has tested negative for the Covid virus.    Review of Systems  Constitutional: Negative.   HENT: Positive for congestion, postnasal drip and sinus pressure. Negative for sore throat.   Eyes: Negative.   Respiratory: Negative.        Objective:   Physical Exam Constitutional:      Appearance: Normal appearance. She is not ill-appearing.  HENT:     Right Ear: Tympanic membrane, ear canal and external ear normal.     Left Ear: Tympanic membrane, ear canal and external ear normal.     Nose: Nose normal.     Mouth/Throat:     Pharynx: Oropharynx is clear.  Eyes:     Conjunctiva/sclera: Conjunctivae normal.  Pulmonary:     Effort: Pulmonary effort is normal.     Breath sounds: Normal breath sounds.  Lymphadenopathy:     Cervical: No cervical adenopathy.  Neurological:     Mental Status: She is alert.           Assessment & Plan:  Sinusitis, treat with a Zpack.  Alysia Penna, MD

## 2021-07-02 ENCOUNTER — Ambulatory Visit
Admission: EM | Admit: 2021-07-02 | Discharge: 2021-07-02 | Disposition: A | Discharge status: Home / Self Care | Payer: BC Managed Care – PPO

## 2021-07-02 ENCOUNTER — Other Ambulatory Visit: Payer: Self-pay

## 2021-10-13 ENCOUNTER — Telehealth: Payer: BC Managed Care – PPO | Admitting: Physician Assistant

## 2021-10-13 DIAGNOSIS — R6889 Other general symptoms and signs: Secondary | ICD-10-CM | POA: Diagnosis not present

## 2021-10-13 MED ORDER — BENZONATATE 100 MG PO CAPS: 100.0000 mg | ORAL_CAPSULE | Freq: Three times a day (TID) | ORAL | 0 refills | Status: DC | PRN

## 2021-10-13 MED ORDER — OSELTAMIVIR PHOSPHATE 75 MG PO CAPS: 75.0000 mg | ORAL_CAPSULE | Freq: Two times a day (BID) | ORAL | 0 refills | Status: DC

## 2021-10-13 NOTE — Patient Instructions (Signed)
Brooke Le, thank you for joining [name], PA-C for today's virtual visit.  While this provider is not your primary care provider (PCP), if your PCP is located in our provider database this encounter information will be shared with them immediately following your visit.  Consent: (Patient) Brooke Le provided verbal consent for this virtual visit at the beginning of the encounter.  Current Medications:  Current Outpatient Medications:    amitriptyline (ELAVIL) 10 MG tablet, Take 2 tablets (20 mg total) by mouth at bedtime., Disp: 180 tablet, Rfl: 2   ascorbic acid (VITAMIN C) 500 MG tablet, Take 500 mg by mouth daily., Disp: , Rfl:    azithromycin (ZITHROMAX Z-PAK) 250 MG tablet, As directed, Disp: 6 each, Rfl: 0   Dextromethorphan-guaiFENesin (MUCINEX DM MAXIMUM STRENGTH) 60-1200 MG TB12, Take 1 tablet by mouth every 12 (twelve) hours as needed (for cough at night)., Disp: 28 tablet, Rfl: 1   gabapentin (NEURONTIN) 300 MG capsule, Take 1 capsule (300 mg total) by mouth 3 (three) times daily as needed (nerve pain)., Disp: 90 capsule, Rfl: 3   hydrochlorothiazide (HYDRODIURIL) 25 MG tablet, TAKE 1 TABLET BY MOUTH EVERY DAY, Disp: 90 tablet, Rfl: 0   naproxen (NAPROSYN) 500 MG tablet, Take 1 tablet (500 mg total) by mouth 2 (two) times daily with a meal., Disp: 30 tablet, Rfl: 0   verapamil (CALAN-SR) 240 MG CR tablet, TAKE 1 TABLET BY MOUTH EVERYDAY AT BEDTIME, Disp: 90 tablet, Rfl: 0   vitamin B-12 (CYANOCOBALAMIN) 1000 MCG tablet, Take 1,000 mcg by mouth daily., Disp: , Rfl:    Vitamin D, Ergocalciferol, (DRISDOL) 1.25 MG (50000 UNIT) CAPS capsule, TAKE 1 CAPSULE (50,000 UNITS TOTAL) BY MOUTH EVERY 7 (SEVEN) DAYS., Disp: 12 capsule, Rfl: 2   Zinc 50 MG TABS, Take 1 tablet by mouth daily., Disp: , Rfl:    Medications ordered in this encounter:  No orders of the defined types were placed in this encounter.    *If you need refills on other medications  prior to your next appointment, please contact your pharmacy*  Follow-Up: Call back or seek an in-person evaluation if the symptoms worsen or if the condition fails to improve as anticipated.  Other Instructions Please keep well-hydrated and get plenty of rest. Alternate tylenol and motrin if needed for headache, aches and fever. Continue Robitussin for cough. You can use along with the Tessalon I have sent in. Start saline rinses to help with nasal congestion. If you have a humidifier, place in bedroom and run at night.   If you note any new or worsening symptoms, we want you to be evaluated in person.  I have sent a work note to Doctor, hospital. We want you to keep at home until feeling better and fever-free for at least 24 hours without fever-reducing medications.   If you have been instructed to have an in-person evaluation today at a local Urgent Care facility, please use the link below. It will take you to a list of all of our available [name], including address, phone number and hours of operation. Please do not delay care.  [facility]  If you or a family member do not have a primary care provider, use the link below to schedule a visit and establish care. When you choose a [facility] primary care physician or advanced practice provider, you gain a long-term partner in health. Find a Primary Care Provider  Learn more about [facility]'s in-office and  virtual care options: [facility]

## 2021-10-13 NOTE — Progress Notes (Signed)
Virtual Visit Consent   Brooke Le, you are scheduled for a virtual visit with a [facility] provider today.     Just as with appointments in the office, your consent must be obtained to participate.  Your consent will be active for this visit and any virtual visit you may have with one of our providers in the next 365 days.     If you have a MyChart account, a copy of this consent can be sent to you electronically.  All virtual visits are billed to your insurance company just like a traditional visit in the office.    As this is a virtual visit, video technology does not allow for your provider to perform a traditional examination.  This may limit your provider's ability to fully assess your condition.  If your provider identifies any concerns that need to be evaluated in person or the need to arrange testing (such as labs, EKG, etc.), we will make arrangements to do so.     Although advances in technology are sophisticated, we cannot ensure that it will always work on either your end or our end.  If the connection with a video visit is poor, the visit may have to be switched to a telephone visit.  With either a video or telephone visit, we are not always able to ensure that we have a secure connection.     I need to obtain your verbal consent now.   Are you willing to proceed with your visit today?    Delene Morais Le has provided verbal consent on 10/13/2021 for a virtual visit (video or telephone).   Brooke Le, [place]   Date: 10/13/2021 11:42 AM   Virtual Visit via Video Note   I, Brooke Le, connected with  JOLIE STROHECKER  ([id number], September 23, 1966) on 10/13/21 at 11:30 AM EDT by a video-enabled telemedicine application and verified that I am speaking with the correct person using two identifiers.  Location: Patient: Virtual Visit Location Patient: Home Provider: Virtual Visit Location Provider: Home Office   I discussed the limitations  of evaluation and management by telemedicine and the availability of in person appointments. The patient expressed understanding and agreed to proceed.    History of Present Illness: Brooke Le is a 55 y.o. who identifies as a female who was assigned female at birth, and is being seen today for possible influenza. Notes symptoms hitting her on Sunday afternoon with low-grade fever, chills, aches, headache and congestion with cough. Cough is sometimes productive of a clear phlegm. She has not had fever since yesterday but has been keeping tylenol in her system every 4-6 hours. Notes her granddaughter was with her the end of last week ans now has the flu. She did take a COVID test at home to be cautious and this was negative. Denies any chest pain or SOB. Is taking Robitussin and Tylenol for symptoms.  HPI: HPI  Problems:  Patient Active Problem List   Diagnosis Date Noted   Multinodular goiter 11/27/2019   Elevated alkaline phosphatase level 11/27/2019   Pedal edema 09/13/2018   Stress 12/29/2012   Rhinitis recurrent  04/22/2012   MENORRHAGIA 01/01/2009   ANEMIA, B12 DEFICIENCY 10/30/2008   ALKALINE PHOSPHATASE, ELEVATED 08/21/2008   COMMON MIGRAINE 06/26/2008   ALLERGIC RHINITIS 06/26/2008   FIBROMYALGIA 05/20/2008   DEFICIENCY, VITAMIN D NOS 08/21/2007   PSTPRC STATUS, BARIATRIC SURGERY 08/21/2007   Iron deficiency anemia 07/14/2007   Essential hypertension 07/14/2007  Allergies:  Allergies  Allergen Reactions   Bee Pollen Swelling   Medications:  Current Outpatient Medications:    benzonatate (TESSALON) 100 MG capsule, Take 1 capsule (100 mg total) by mouth 3 (three) times daily as needed for cough., Disp: 30 capsule, Rfl: 0   oseltamivir (TAMIFLU) 75 MG capsule, Take 1 capsule (75 mg total) by mouth 2 (two) times daily., Disp: 10 capsule, Rfl: 0   amitriptyline (ELAVIL) 10 MG tablet, Take 2 tablets (20 mg total) by mouth at bedtime., Disp: 180 tablet, Rfl: 2    ascorbic acid (VITAMIN C) 500 MG tablet, Take 500 mg by mouth daily., Disp: , Rfl:    hydrochlorothiazide (HYDRODIURIL) 25 MG tablet, TAKE 1 TABLET BY MOUTH EVERY DAY, Disp: 90 tablet, Rfl: 0   naproxen (NAPROSYN) 500 MG tablet, Take 1 tablet (500 mg total) by mouth 2 (two) times daily with a meal., Disp: 30 tablet, Rfl: 0   verapamil (CALAN-SR) 240 MG CR tablet, TAKE 1 TABLET BY MOUTH EVERYDAY AT BEDTIME, Disp: 90 tablet, Rfl: 0   vitamin B-12 (CYANOCOBALAMIN) 1000 MCG tablet, Take 1,000 mcg by mouth daily., Disp: , Rfl:    Vitamin D, Ergocalciferol, (DRISDOL) 1.25 MG (50000 UNIT) CAPS capsule, TAKE 1 CAPSULE (50,000 UNITS TOTAL) BY MOUTH EVERY 7 (SEVEN) DAYS., Disp: 12 capsule, Rfl: 2   Zinc 50 MG TABS, Take 1 tablet by mouth daily., Disp: , Rfl:   Observations/Objective: Patient is well-developed, well-nourished in no acute distress.  Resting comfortably at home.  Head is normocephalic, atraumatic.  No labored breathing. Speech is clear and coherent with logical content.  Patient is alert and oriented at baseline.   Assessment and Plan: 1. Flu-like symptoms - oseltamivir (TAMIFLU) 75 MG capsule; Take 1 capsule (75 mg total) by mouth 2 (two) times daily.  Dispense: 10 capsule; Refill: 0 - benzonatate (TESSALON) 100 MG capsule; Take 1 capsule (100 mg total) by mouth 3 (three) times daily as needed for cough.  Dispense: 30 capsule; Refill: 0 Exposure. Classic symptoms and negative COVID test. Will start antiviral giving more severe symptoms. Rx Tamiflu. Supportive measures and OTC medications reviewed. Tessalon per orders.   Follow Up Instructions: I discussed the assessment and treatment plan with the patient. The patient was provided an opportunity to ask questions and all were answered. The patient agreed with the plan and demonstrated an understanding of the instructions.  A copy of instructions were sent to the patient via MyChart unless otherwise noted below.   The patient was advised  to call back or seek an in-person evaluation if the symptoms worsen or if the condition fails to improve as anticipated.  Time:  I spent 12 minutes with the patient via telehealth technology discussing the above problems/concerns.    Brooke Rio, PA-C

## 2021-10-28 ENCOUNTER — Ambulatory Visit: Payer: BC Managed Care – PPO | Admitting: Internal Medicine

## 2021-10-28 NOTE — Progress Notes (Deleted)
No chief complaint on file.   HPI: Patient  Brooke Le  55 y.o. comes in today for Preventive Health Care visit    Ed visit 7 222  video visit  with PA   flu like  11 01 given tamiflu    covid infection 5 22  Health Maintenance  Topic Date Due   Pneumococcal Vaccine 32-2 Years old (1 - PCV) Never done   Hepatitis C Screening  Never done   Zoster Vaccines- Shingrix (1 of 2) Never done   PAP SMEAR-Modifier  09/08/2019   MAMMOGRAM  09/29/2019   COVID-19 Vaccine (3 - Moderna risk series) 06/10/2020   INFLUENZA VACCINE  Never done   TETANUS/TDAP  10/18/2028   COLONOSCOPY (Pts 45-99yrs Insurance coverage will need to be confirmed)  02/13/2030   HIV Screening  Completed   HPV VACCINES  Aged Out   Health Maintenance Review LIFESTYLE:  Exercise:   Tobacco/ETS: Alcohol:  Sugar beverages: Sleep: Drug use: no HH of  Work:    ROS:  GEN/ HEENT: No fever, significant weight changes sweats headaches vision problems hearing changes, CV/ PULM; No chest pain shortness of breath cough, syncope,edema  change in exercise tolerance. GI /GU: No adominal pain, vomiting, change in bowel habits. No blood in the stool. No significant GU symptoms. SKIN/HEME: ,no acute skin rashes suspicious lesions or bleeding. No lymphadenopathy, nodules, masses.  NEURO/ PSYCH:  No neurologic signs such as weakness numbness. No depression anxiety. IMM/ Allergy: No unusual infections.  Allergy .   REST of 12 system review negative except as per HPI   Past Medical History:  Diagnosis Date   Allergic rhinitis    Allergy    Anemia, iron deficiency    Anxiety    Fibromyalgia    Gynecological examination    Dr. 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    Qualifier: Diagnosis of  By: Regis Bill MD, Standley Brooking    Syncope    ER evaluation in 2008; had anemia then    Past Surgical History:  Procedure Laterality Date   ADENOIDECTOMY     ESOPHAGOGASTRODUODENOSCOPY  10/31/2002   GASTRIC BYPASS     TUBAL LIGATION     UPPER GASTROINTESTINAL ENDOSCOPY      Family History  Problem Relation Age of Onset   Alcohol abuse Other    Diabetes Other    Hypertension Other    Prostate cancer Father    Esophageal cancer Maternal Aunt    Colon cancer Maternal Uncle    Colon cancer Cousin    Colon cancer Other    Rectal cancer Neg Hx    Stomach cancer Neg Hx     Social History   Socioeconomic History   Marital status: Legally Separated    Spouse name: Not on file   Number of children: Not on file   Years of education: Not on file   Highest education level: Not on file  Occupational History   Not on file  Tobacco Use   Smoking status: Never   Smokeless tobacco: Never  Vaping Use   Vaping Use: Never used  Substance and Sexual Activity   Alcohol use: Yes  Comment: socially    Drug use: Never   Sexual activity: Not on file  Other Topics Concern   Not on file  Social History Narrative   Married   Regular Exercise- no   Laid off [employer]- July 2011 lost insurance in Jan.2012   Back 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    No pets             Social Determinants of Radio broadcast assistant Strain: Not on file  Food Insecurity: Not on file  Transportation Needs: Not on file  Physical Activity: Not on file  Stress: Not on file  Social Connections: Not on file    Outpatient Medications Prior to Visit  Medication Sig Dispense Refill   amitriptyline (ELAVIL) 10 MG tablet Take 2 tablets (20 mg total) by mouth at bedtime. 180 tablet 2   ascorbic acid (VITAMIN C) 500 MG tablet Take 500 mg by mouth daily.     benzonatate (TESSALON) 100 MG capsule Take 1 capsule (100 mg total)  by mouth 3 (three) times daily as needed for cough. 30 capsule 0   hydrochlorothiazide (HYDRODIURIL) 25 MG tablet TAKE 1 TABLET BY MOUTH EVERY DAY 90 tablet 0   naproxen (NAPROSYN) 500 MG tablet Take 1 tablet (500 mg total) by mouth 2 (two) times daily with a meal. 30 tablet 0   oseltamivir (TAMIFLU) 75 MG capsule Take 1 capsule (75 mg total) by mouth 2 (two) times daily. 10 capsule 0   verapamil (CALAN-SR) 240 MG CR tablet TAKE 1 TABLET BY MOUTH EVERYDAY AT BEDTIME 90 tablet 0   vitamin B-12 (CYANOCOBALAMIN) 1000 MCG tablet Take 1,000 mcg by mouth daily.     Vitamin D, Ergocalciferol, (DRISDOL) 1.25 MG (50000 UNIT) CAPS capsule TAKE 1 CAPSULE (50,000 UNITS TOTAL) BY MOUTH EVERY 7 (SEVEN) DAYS. 12 capsule 2   Zinc 50 MG TABS Take 1 tablet by mouth daily.     No facility-administered medications prior to visit.     EXAM:  LMP 10/04/2018 (Approximate)   There is no height or weight on file to calculate BMI. Wt Readings from Last 3 Encounters:  04/30/21 276 lb 9.6 oz (125.5 kg)  12/31/20 282 lb 4.8 oz (128.1 kg)  12/03/20 286 lb 9.6 oz (130 kg)    Physical Exam: Vital signs reviewed [id number] is a well-developed well-nourished alert cooperative    who appearsr stated age in no acute distress.  HEENT: normocephalic atraumatic , Eyes: PERRL EOM's full, conjunctiva clear, Nares: paten,t no deformity discharge or tenderness., Ears: no deformity EAC's clear TMs with normal landmarks. Mouth: clear OP, no lesions, edema.  Moist mucous membranes. Dentition in adequate repair. NECK: supple without masses, thyromegaly or bruits. CHEST/PULM:  Clear to auscultation and percussion breath sounds equal no wheeze , rales or rhonchi. No chest wall deformities or tenderness. Breast: normal by inspection . No dimpling, discharge, masses, tenderness or discharge . CV: PMI is nondisplaced, S1 S2 no gallops, murmurs, rubs. Peripheral pulses are full without delay.No JVD .  ABDOMEN: Bowel sounds normal  nontender  No guard or rebound, no hepato splenomegal no CVA tenderness.  No hernia. Extremtities:  No clubbing cyanosis or edema, no acute joint swelling or redness no focal atrophy NEURO:  Oriented x3, cranial nerves 3-12 appear to be intact, no obvious focal weakness,gait within normal limits no abnormal reflexes or asymmetrical SKIN: No acute rashes normal turgor,  color, no bruising or petechiae. PSYCH: Oriented, good eye contact, no obvious depression anxiety, cognition and judgment appear normal. LN: no cervical axillary inguinal adenopathy  Lab Results  Component Value Date   WBC 5.1 06/06/2020   HGB 11.9 (L) 06/06/2020   HCT 35.8 (L) 06/06/2020   PLT 222.0 06/06/2020   GLUCOSE 94 06/06/2020   CHOL 173 10/22/2019   TRIG 63.0 10/22/2019   HDL 51.60 10/22/2019   LDLCALC 109 (H) 10/22/2019   ALT 15 06/06/2020   AST 17 06/06/2020   NA 142 06/06/2020   K 4.0 06/06/2020   CL 108 06/06/2020   CREATININE 1.06 06/06/2020   BUN 13 06/06/2020   CO2 27 06/06/2020   TSH 1.02 06/06/2020   HGBA1C 5.7 10/22/2019    BP Readings from Last 3 Encounters:  04/30/21 (!) 140/96  12/31/20 110/80  12/03/20 (!) 164/100    Over due for lab monitoring   ASSESSMENT AND PLAN:  Discussed the following assessment and plan:    ICD-10-CM   1. Visit for preventive health examination  Z00.00     2. Medication management  Z79.899     3. PSTPRC STATUS, BARIATRIC SURGERY  Z98.84     4. Essential hypertension  I10     5. Iron deficiency anemia, unspecified iron deficiency anemia type  D50.9      No follow-ups on file.  Patient Care Team: Burnis Medin, MD as PCP - General There are no Patient Instructions on file for this visit.  Standley Brooking. Sheilla Maris M.D.

## 2021-12-02 ENCOUNTER — Ambulatory Visit (INDEPENDENT_AMBULATORY_CARE_PROVIDER_SITE_OTHER): Payer: BC Managed Care – PPO | Admitting: Internal Medicine

## 2021-12-02 VITALS — BP 138/84 | HR 58 | Temp 97.9°F | Ht 68.0 in | Wt 273.4 lb

## 2021-12-02 DIAGNOSIS — D518 Other vitamin B12 deficiency anemias: Secondary | ICD-10-CM | POA: Diagnosis not present

## 2021-12-02 DIAGNOSIS — Z79899 Other long term (current) drug therapy: Secondary | ICD-10-CM

## 2021-12-02 DIAGNOSIS — D509 Iron deficiency anemia, unspecified: Secondary | ICD-10-CM | POA: Diagnosis not present

## 2021-12-02 DIAGNOSIS — E559 Vitamin D deficiency, unspecified: Secondary | ICD-10-CM

## 2021-12-02 DIAGNOSIS — I10 Essential (primary) hypertension: Secondary | ICD-10-CM | POA: Diagnosis not present

## 2021-12-02 DIAGNOSIS — Z0000 Encounter for general adult medical examination without abnormal findings: Secondary | ICD-10-CM

## 2021-12-02 DIAGNOSIS — S8992XS Unspecified injury of left lower leg, sequela: Secondary | ICD-10-CM

## 2021-12-02 DIAGNOSIS — R748 Abnormal levels of other serum enzymes: Secondary | ICD-10-CM

## 2021-12-02 DIAGNOSIS — E042 Nontoxic multinodular goiter: Secondary | ICD-10-CM

## 2021-12-02 DIAGNOSIS — Z9884 Bariatric surgery status: Secondary | ICD-10-CM

## 2021-12-02 NOTE — Progress Notes (Signed)
Chief Complaint  Patient presents with   Follow-up    Check up    HPI: Patient  Brooke Le  55 y.o. comes in today for Preventive Health Care visit  and Chronic disease management  Delayed fu  due for lab monitoring  med check cpx BP not bad    HAs  stress related  once a month   migraine otc and rests  Hx iron def anemia  taking womens vits and fish oil  Hx thyroid nodule  bx jan 2021 Consistent with benign follicular nodule ([employer]hes[employer] category II)  Hemangioma live on [place]  2.5 Knee injury work related still bothering her months out  some swelling  Utd mammogram  at job.   Health Maintenance  Topic Date Due   Hepatitis C Screening  Never done   Zoster Vaccines- Shingrix (1 of 2) Never done   PAP SMEAR-Modifier  09/08/2019   Pneumococcal Vaccine 15-6 Years old (1 - PCV) 06/02/2022 (Originally 03/06/1972)   INFLUENZA VACCINE  06/02/2022 (Originally 07/13/2021)   MAMMOGRAM  06/02/2022 (Originally 09/29/2019)   COVID-19 Vaccine (3 - Moderna risk series) 06/02/2022 (Originally 06/10/2020)   TETANUS/TDAP  10/18/2028   COLONOSCOPY (Pts 45-3yrs Insurance coverage will need to be confirmed)  02/13/2030   HIV Screening  Completed   HPV VACCINES  Aged Out   Health Maintenance Review LIFESTYLE:  Exercise:   fell at work  in may   had used [place]  Tobacco/ETS:no Alcohol:  no Sugar beverages:n Sleep: Drug use: no HH of  a lot 6  and 2 dogs  Work: horrible work schedule.  Not late nights    irregular   about 40 - 50 .   ROS:  REST of 12 system review negative except as per HPI   Past Medical History:  Diagnosis Date   Allergic rhinitis    Allergy    Anemia, iron deficiency    Anxiety    Fibromyalgia    Gynecological examination    Dr. 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   Qualifier: Diagnosis of  By: Regis Bill MD, Standley Brooking    Syncope    ER evaluation in 2008; had anemia then    Past Surgical History:  Procedure Laterality Date   ADENOIDECTOMY     ESOPHAGOGASTRODUODENOSCOPY  10/31/2002   GASTRIC BYPASS     TUBAL LIGATION     UPPER GASTROINTESTINAL ENDOSCOPY      Family History  Problem Relation Age of Onset   Alcohol abuse Other    Diabetes Other    Hypertension Other    Prostate cancer Father    Esophageal cancer Maternal Aunt    Colon cancer Maternal Uncle    Colon cancer Cousin    Colon cancer Other    Rectal cancer Neg Hx    Stomach cancer Neg Hx     Social History   Socioeconomic History   Marital status: Legally Separated    Spouse name: Not on file   Number of children: Not on file   Years of education: Not on file   Highest education level: Some college, no degree  Occupational History   Not on  file  Tobacco Use   Smoking status: Never   Smokeless tobacco: Never  Vaping Use   Vaping Use: Never used  Substance and Sexual Activity   Alcohol use: Yes    Comment: socially    Drug use: Never   Sexual activity: Not on file  Other Topics Concern   Not on file  Social History Narrative   Married   Regular Exercise- no   Laid off [employer]- July 2011 lost insurance in [place] 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    No pets             Social Determinants of Radio broadcast assistant Strain: Low Risk    Difficulty of Paying Living Expenses: Not very hard  Food Insecurity: No Food Insecurity   Worried About Charity fundraiser in the Last Year: Never true   Arboriculturist in the Last Year: Never true  Transportation Needs: No Transportation Needs   Lack of Transportation (Medical): No   Lack of Transportation (Non-Medical): No  Physical Activity: Inactive   Days of Exercise per Week: 1 day    Minutes of Exercise per Session: 0 min  Stress: Stress Concern Present   Feeling of Stress : To some extent  Social Connections: Socially Integrated   Frequency of Communication with Friends and Family: Three times a week   Frequency of Social Gatherings with Friends and Family: Once a week   Attends Religious Services: More than 4 times per year   Active Member of [employer] or Organizations: Yes   Attends Archivist Meetings: 1 to 4 times per year   Marital Status: Living with partner    Outpatient Medications Prior to Visit  Medication Sig Dispense Refill   amitriptyline (ELAVIL) 10 MG tablet Take 2 tablets (20 mg total) by mouth at bedtime. 180 tablet 2   ascorbic acid (VITAMIN C) 500 MG tablet Take 500 mg by mouth daily.     hydrochlorothiazide (HYDRODIURIL) 25 MG tablet TAKE 1 TABLET BY MOUTH EVERY DAY 90 tablet 0   naproxen (NAPROSYN) 500 MG tablet Take 1 tablet (500 mg total) by mouth 2 (two) times daily with a meal. 30 tablet 0   verapamil (CALAN-SR) 240 MG CR tablet TAKE 1 TABLET BY MOUTH EVERYDAY AT BEDTIME 90 tablet 0   vitamin B-12 (CYANOCOBALAMIN) 1000 MCG tablet Take 1,000 mcg by mouth daily.     Vitamin D, Ergocalciferol, (DRISDOL) 1.25 MG (50000 UNIT) CAPS capsule TAKE 1 CAPSULE (50,000 UNITS TOTAL) BY MOUTH EVERY 7 (SEVEN) DAYS. 12 capsule 2   Zinc 50 MG TABS Take 1 tablet by mouth daily.     benzonatate (TESSALON) 100 MG capsule Take 1 capsule (100 mg total) by mouth 3 (three) times daily as needed for cough. 30 capsule 0   oseltamivir (TAMIFLU) 75 MG capsule Take 1 capsule (75 mg total) by mouth 2 (two) times daily. 10 capsule 0   No facility-administered medications prior to visit.     EXAM:  BP 138/84 (BP Location: Right Arm, Patient Position: Sitting, Cuff Size: Normal)    Pulse (!) 58    Temp 97.9 F (36.6 C) (Oral)    Ht 5\' 8"  (1.727 m)    Wt 273 lb 6.4 oz (124 kg)    LMP 10/04/2018 (Approximate)    SpO2 98%    BMI 41.57 kg/m  Body mass index is 41.57  kg/m. Wt Readings from Last 3 Encounters:  12/02/21 273 lb 6.4 oz (124 kg)  04/30/21 276 lb 9.6 oz (125.5 kg)  12/31/20 282 lb 4.8 oz (128.1 kg)    Physical Exam: Vital signs reviewed QQP:YPPJ is a well-developed well-nourished alert cooperative    who appearsr stated age in no acute distress.  HEENT: normocephalic atraumatic , Eyes: PERRL EOM's full, conjunctiva clear, Nares: paten,t no deformity discharge or tenderness., Ears: no deformity EAC's clear TMs with normal landmarks. Mouth:masked  NECK: supple without masses, thyromegaly or bruits. CHEST/PULM:  Clear to auscultation and percussion breath sounds equal no wheeze , rales or rhonchi. No chest wall deformities or tenderness. Breast: normal by inspection . No dimpling, discharge, masses, tenderness or discharge . CV: PMI is nondisplaced, S1 S2 no gallops, murmurs, rubs. Peripheral pulses are full without delay.No JVD .  ABDOMEN: Bowel sounds normal nontender  No guard or rebound, no hepato splenomegal no CVA tenderness.  Extremtities:  No clubbing cyanosis or edema,  no focal atrophy ledft knee mild effusion  no warmth or redness NEURO:  Oriented x3, cranial nerves 3-12 appear to be intact, no obvious focal weakness,gait within normal limits no abnormal reflexes or asymmetrical SKIN: No acute rashes normal turgor, color, no bruising or petechiae. PSYCH: Oriented, good eye contact, no obvious depression anxiety, cognition and judgment appear normal. LN: no cervical axillary inguinal adenopathy  Lab Results  Component Value Date   WBC 5.1 06/06/2020   HGB 11.9 (L) 06/06/2020   HCT 35.8 (L) 06/06/2020   PLT 222.0 06/06/2020   GLUCOSE 94 06/06/2020   CHOL 173 10/22/2019   TRIG 63.0 10/22/2019   HDL 51.60 10/22/2019   LDLCALC 109 (H) 10/22/2019   ALT 15 06/06/2020   AST 17 06/06/2020   NA 142 06/06/2020   K 4.0 06/06/2020   CL 108 06/06/2020   CREATININE 1.06 06/06/2020   BUN 13 06/06/2020   CO2 27 06/06/2020   TSH 1.02  06/06/2020   HGBA1C 5.7 10/22/2019   Lab Results  Component Value Date   VITAMINB12 1,335 (H) 10/22/2019  Last vitamin D Lab Results  Component Value Date   VD25OH 33.94 12/19/2019     BP Readings from Last 3 Encounters:  12/02/21 138/84  04/30/21 (!) 140/96  12/31/20 110/80    Lab  plan reviewed with patient   ASSESSMENT AND PLAN:  Discussed the following assessment and plan:    ICD-10-CM   1. Visit for preventive health examination  K93.26 Basic metabolic panel    CBC with Differential/Platelet    Hemoglobin A1c    Hepatic function panel    Lipid panel    TSH    VITAMIN D 25 Hydroxy (Vit-D Deficiency, Fractures)    Vitamin B12    Iron, TIBC and Ferritin Panel    2. Essential hypertension  Z12 Basic metabolic panel    CBC with Differential/Platelet    Hemoglobin A1c    Hepatic function panel    Lipid panel    TSH    VITAMIN D 25 Hydroxy (Vit-D Deficiency, Fractures)    Vitamin B12    Iron, TIBC and Ferritin Panel    3. ANEMIA, B12 DEFICIENCY  W58.0 Basic metabolic panel    CBC with Differential/Platelet    Hemoglobin A1c    Hepatic function panel    Lipid panel    TSH    VITAMIN D 25 Hydroxy (Vit-D Deficiency, Fractures)    Vitamin B12  Iron, TIBC and Ferritin Panel    4. Iron deficiency anemia, unspecified iron deficiency anemia type  G95.6 Basic metabolic panel    CBC with Differential/Platelet    Hemoglobin A1c    Hepatic function panel    Lipid panel    TSH    VITAMIN D 25 Hydroxy (Vit-D Deficiency, Fractures)    Vitamin B12    Iron, TIBC and Ferritin Panel    5. Multinodular goiter  O13.0 Basic metabolic panel    CBC with Differential/Platelet    Hemoglobin A1c    Hepatic function panel    Lipid panel    TSH    VITAMIN D 25 Hydroxy (Vit-D Deficiency, Fractures)    Vitamin B12    Iron, TIBC and Ferritin Panel    6. Elevated alkaline phosphatase level  Q65.7 Basic metabolic panel    CBC with Differential/Platelet    Hemoglobin A1c     Hepatic function panel    Lipid panel    TSH    VITAMIN D 25 Hydroxy (Vit-D Deficiency, Fractures)    Vitamin B12    Iron, TIBC and Ferritin Panel    7. Medication management  Q46.962 Basic metabolic panel    CBC with Differential/Platelet    Hemoglobin A1c    Hepatic function panel    Lipid panel    TSH    VITAMIN D 25 Hydroxy (Vit-D Deficiency, Fractures)    Vitamin B12    Iron, TIBC and Ferritin Panel    8. Status post bariatric surgery  X52.84 Basic metabolic panel    CBC with Differential/Platelet    Hemoglobin A1c    Hepatic function panel    Lipid panel    TSH    VITAMIN D 25 Hydroxy (Vit-D Deficiency, Fractures)    Vitamin B12    Iron, TIBC and Ferritin Panel    9. Vitamin D deficiency  X32.4 Basic metabolic panel    CBC with Differential/Platelet    Hemoglobin A1c    Hepatic function panel    Lipid panel    TSH    VITAMIN D 25 Hydroxy (Vit-D Deficiency, Fractures)    Vitamin B12    Iron, TIBC and Ferritin Panel    10. Injury of left knee, sequela at work   S89.92XS     Reviewed above needs lab monitoring .  Advise fu for recent  knee injury .  Return for depending on results 6- 12 months .  Patient Care Team: Burnis Medin, MD as PCP - General Patient Instructions  Good to see you today .   Get lab appt. For next week.   Agree you should have a follow up for the knee injury  at work .   Make sure taking   multivits with  iron and  b vitamins  and vit d .  In supplement .Standley Brooking. Edgel Degnan M.D.

## 2021-12-02 NOTE — Patient Instructions (Addendum)
Good to see you today .   Get lab appt. For next week.   Agree you should have a follow up for the knee injury  at work .   Make sure taking   multivits with  iron and  b vitamins  and vit d .  In supplement .

## 2021-12-08 ENCOUNTER — Other Ambulatory Visit: Payer: BC Managed Care – PPO

## 2021-12-31 ENCOUNTER — Other Ambulatory Visit: Payer: Self-pay | Admitting: Internal Medicine

## 2022-04-05 ENCOUNTER — Other Ambulatory Visit: Payer: Self-pay | Admitting: Internal Medicine

## 2022-04-06 NOTE — Telephone Encounter (Signed)
Have her make appt for lab  orders in system pending .   Then refill the medication

## 2022-04-06 NOTE — Telephone Encounter (Signed)
Last Ov 12/02/21 ?Filled 12/31/21 ?Is it ok to refill? ?

## 2022-04-08 NOTE — Telephone Encounter (Signed)
Patient has lab appt scheduled 04/14/22 '@9am'$  Rx sent to the pharmacy ?

## 2022-04-14 ENCOUNTER — Other Ambulatory Visit (INDEPENDENT_AMBULATORY_CARE_PROVIDER_SITE_OTHER): Payer: BC Managed Care – PPO

## 2022-04-14 DIAGNOSIS — I10 Essential (primary) hypertension: Secondary | ICD-10-CM

## 2022-04-14 DIAGNOSIS — Z9884 Bariatric surgery status: Secondary | ICD-10-CM

## 2022-04-14 DIAGNOSIS — D509 Iron deficiency anemia, unspecified: Secondary | ICD-10-CM | POA: Diagnosis not present

## 2022-04-14 DIAGNOSIS — Z0000 Encounter for general adult medical examination without abnormal findings: Secondary | ICD-10-CM

## 2022-04-14 DIAGNOSIS — Z79899 Other long term (current) drug therapy: Secondary | ICD-10-CM

## 2022-04-14 DIAGNOSIS — D518 Other vitamin B12 deficiency anemias: Secondary | ICD-10-CM | POA: Diagnosis not present

## 2022-04-14 DIAGNOSIS — E559 Vitamin D deficiency, unspecified: Secondary | ICD-10-CM

## 2022-04-14 DIAGNOSIS — R748 Abnormal levels of other serum enzymes: Secondary | ICD-10-CM

## 2022-04-14 DIAGNOSIS — E042 Nontoxic multinodular goiter: Secondary | ICD-10-CM

## 2022-04-14 LAB — LIPID PANEL
Cholesterol: 177 mg/dL (ref 0–200)
HDL: 52.4 mg/dL (ref >39.00)
LDL Cholesterol: 113 mg/dL — ABNORMAL HIGH (ref 0–99)
NonHDL: 124.64
Total CHOL/HDL Ratio: 3
Triglycerides: 59 mg/dL (ref 0.0–149.0)
VLDL: 11.8 mg/dL (ref 0.0–40.0)

## 2022-04-14 LAB — CBC WITH DIFFERENTIAL/PLATELET
Basophils Absolute: 0 10*3/uL (ref 0.0–0.1)
Basophils Relative: 0.4 % (ref 0.0–3.0)
Eosinophils Absolute: 0.2 10*3/uL (ref 0.0–0.7)
Eosinophils Relative: 4.9 % (ref 0.0–5.0)
HCT: 37.4 % (ref 36.0–46.0)
Hemoglobin: 12.4 g/dL (ref 12.0–15.0)
Lymphocytes Relative: 45.8 % (ref 12.0–46.0)
Lymphs Abs: 2.1 10*3/uL (ref 0.7–4.0)
MCHC: 33.2 g/dL (ref 30.0–36.0)
MCV: 88.4 fl (ref 78.0–100.0)
Monocytes Absolute: 0.3 10*3/uL (ref 0.1–1.0)
Monocytes Relative: 6.3 % (ref 3.0–12.0)
Neutro Abs: 1.9 10*3/uL (ref 1.4–7.7)
Neutrophils Relative %: 42.6 % — ABNORMAL LOW (ref 43.0–77.0)
Platelets: 229 10*3/uL (ref 150.0–400.0)
RBC: 4.23 Mil/uL (ref 3.87–5.11)
RDW: 13.8 % (ref 11.5–15.5)
WBC: 4.5 10*3/uL (ref 4.0–10.5)

## 2022-04-14 LAB — HEPATIC FUNCTION PANEL
ALT: 14 U/L (ref 0–35)
AST: 17 U/L (ref 0–37)
Albumin: 3.7 g/dL (ref 3.5–5.2)
Alkaline Phosphatase: 196 U/L — ABNORMAL HIGH (ref 39–117)
Bilirubin, Direct: 0.2 mg/dL (ref 0.0–0.3)
Total Bilirubin: 1.1 mg/dL (ref 0.2–1.2)
Total Protein: 6.9 g/dL (ref 6.0–8.3)

## 2022-04-14 LAB — HEMOGLOBIN A1C: Hgb A1c MFr Bld: 5.6 % (ref 4.6–6.5)

## 2022-04-14 LAB — BASIC METABOLIC PANEL
BUN: 15 mg/dL (ref 6–23)
CO2: 30 mEq/L (ref 19–32)
Calcium: 8.9 mg/dL (ref 8.4–10.5)
Chloride: 106 mEq/L (ref 96–112)
Creatinine, Ser: 1.05 mg/dL (ref 0.40–1.20)
GFR: 59.56 mL/min — ABNORMAL LOW (ref >60.00)
Glucose, Bld: 94 mg/dL (ref 70–99)
Potassium: 4.2 mEq/L (ref 3.5–5.1)
Sodium: 141 mEq/L (ref 135–145)

## 2022-04-14 LAB — VITAMIN B12: Vitamin B-12: 368 pg/mL (ref 211–911)

## 2022-04-14 LAB — VITAMIN D 25 HYDROXY (VIT D DEFICIENCY, FRACTURES): VITD: 24.52 ng/mL — ABNORMAL LOW (ref 30.00–100.00)

## 2022-04-14 LAB — TSH: TSH: 0.94 u[IU]/mL (ref 0.35–5.50)

## 2022-04-15 LAB — IRON,TIBC AND FERRITIN PANEL
%SAT: 26 % (calc) (ref 16–45)
Ferritin: 20 ng/mL (ref 16–232)
Iron: 86 ug/dL (ref 45–160)
TIBC: 332 mcg/dL (calc) (ref 250–450)

## 2022-05-15 NOTE — Progress Notes (Signed)
VIt d level is still low  again and alkaline phosphatase if higher . Please send in  Rx vit d 3     50000Iu   take one per week  disp 12 .   Please arrange  lab : vit d level , alkaline phosphatase level and  GGT level in 3 months  then fu visit  to review results

## 2022-05-17 ENCOUNTER — Other Ambulatory Visit: Payer: Self-pay

## 2022-05-17 ENCOUNTER — Other Ambulatory Visit: Payer: Self-pay | Admitting: Internal Medicine

## 2022-05-17 MED ORDER — ASCORBIC ACID 500 MG PO TABS: 500.0000 mg | ORAL_TABLET | Freq: Every day | ORAL | 1 refills | Status: DC

## 2022-05-17 MED ORDER — VITAMIN D (ERGOCALCIFEROL) 1.25 MG (50000 UNIT) PO CAPS: 50000.0000 [IU] | ORAL_CAPSULE | ORAL | 0 refills | Status: DC

## 2022-05-18 ENCOUNTER — Telehealth: Payer: Self-pay

## 2022-05-18 DIAGNOSIS — R748 Abnormal levels of other serum enzymes: Secondary | ICD-10-CM

## 2022-05-18 DIAGNOSIS — E559 Vitamin D deficiency, unspecified: Secondary | ICD-10-CM

## 2022-05-18 NOTE — Telephone Encounter (Signed)
If pt calls back please schedule lab appt for 08/18/22 or after; needs OV with panosh 3-5 days after the lab appt.  Transfer call to [place] or [facility] IF pt has additional questions.  Thank you.

## 2022-05-18 NOTE — Telephone Encounter (Signed)
-----   Message from Burnis Medin, MD sent at 05/15/2022  6:34 PM EDT ----- VIt d level is still low  again and alkaline phosphatase if higher . Please send in  Rx vit d 3     50000Iu   take one per week  disp 12 .   Please arrange  lab : vit d level , alkaline phosphatase level and  GGT level in 3 months  then fu visit  to review results

## 2022-06-14 ENCOUNTER — Ambulatory Visit: Payer: BC Managed Care – PPO | Admitting: Internal Medicine

## 2022-06-16 ENCOUNTER — Telehealth: Payer: Self-pay

## 2022-06-16 IMAGING — DX DG LUMBAR SPINE 2-3V
3 series · 3 of 3 positions shown · non-contrast
Comparison: None.

CLINICAL DATA: Low back pain with left-sided radiculopathy for 1
month

EXAM:
LUMBAR SPINE - 2-3 VIEW

[l-spine ap]
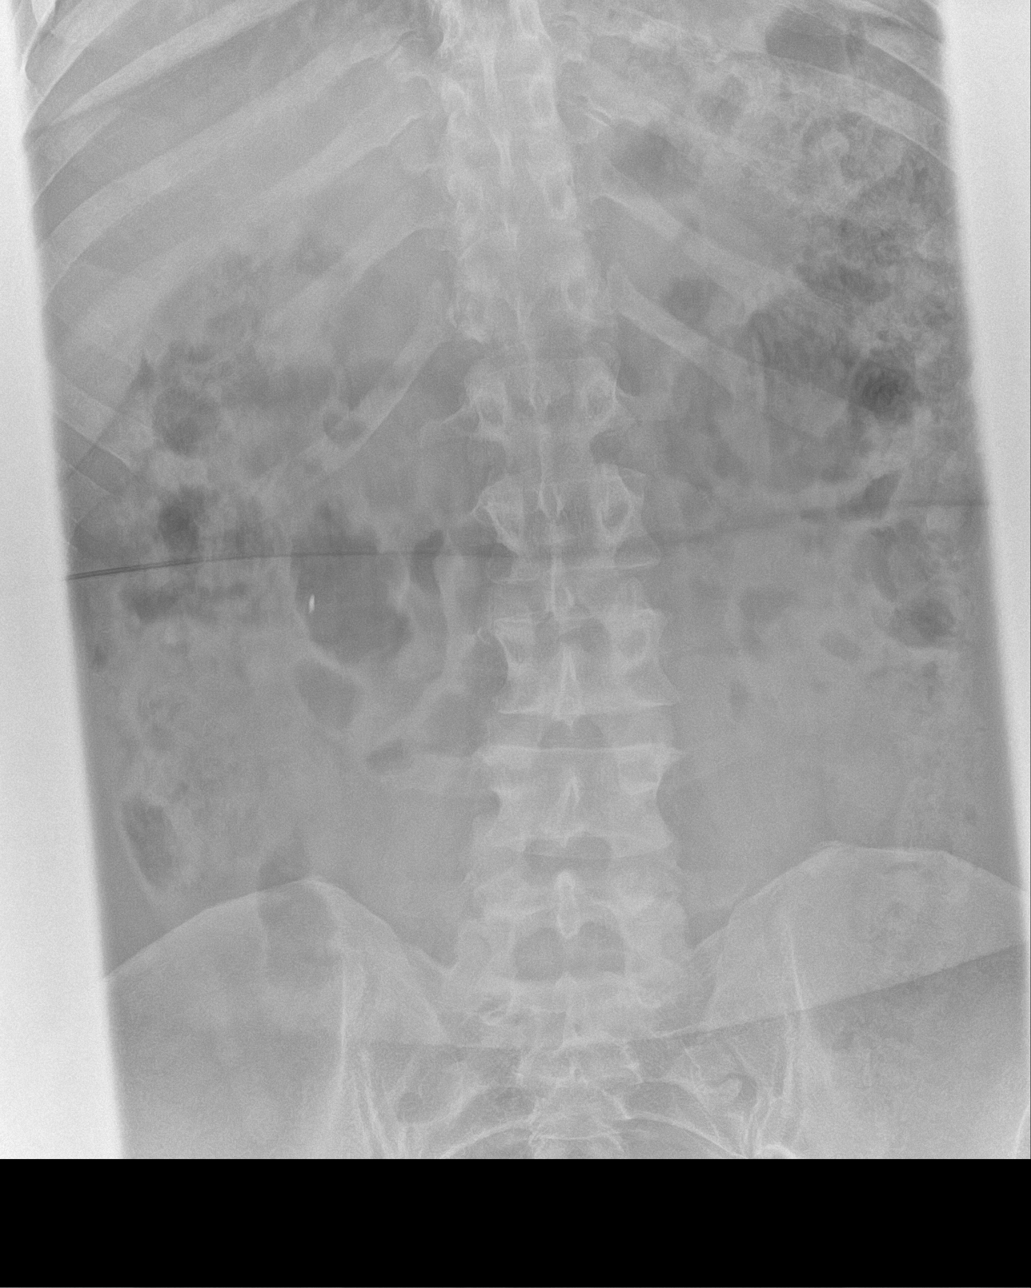

[l-spine lateral (1 of 2)]
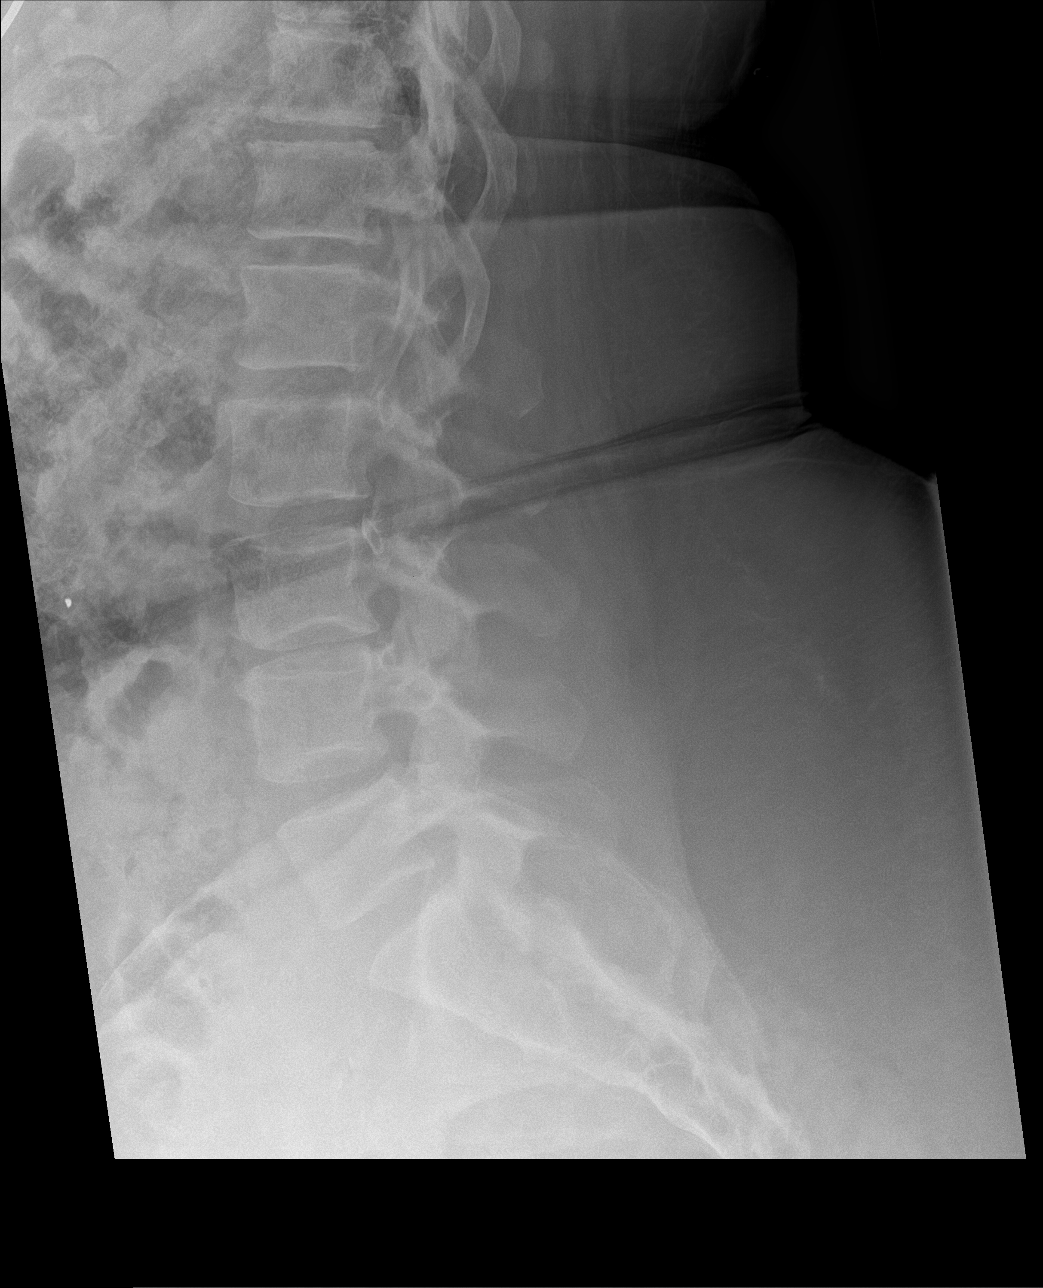

[l-spine lateral (2 of 2)]
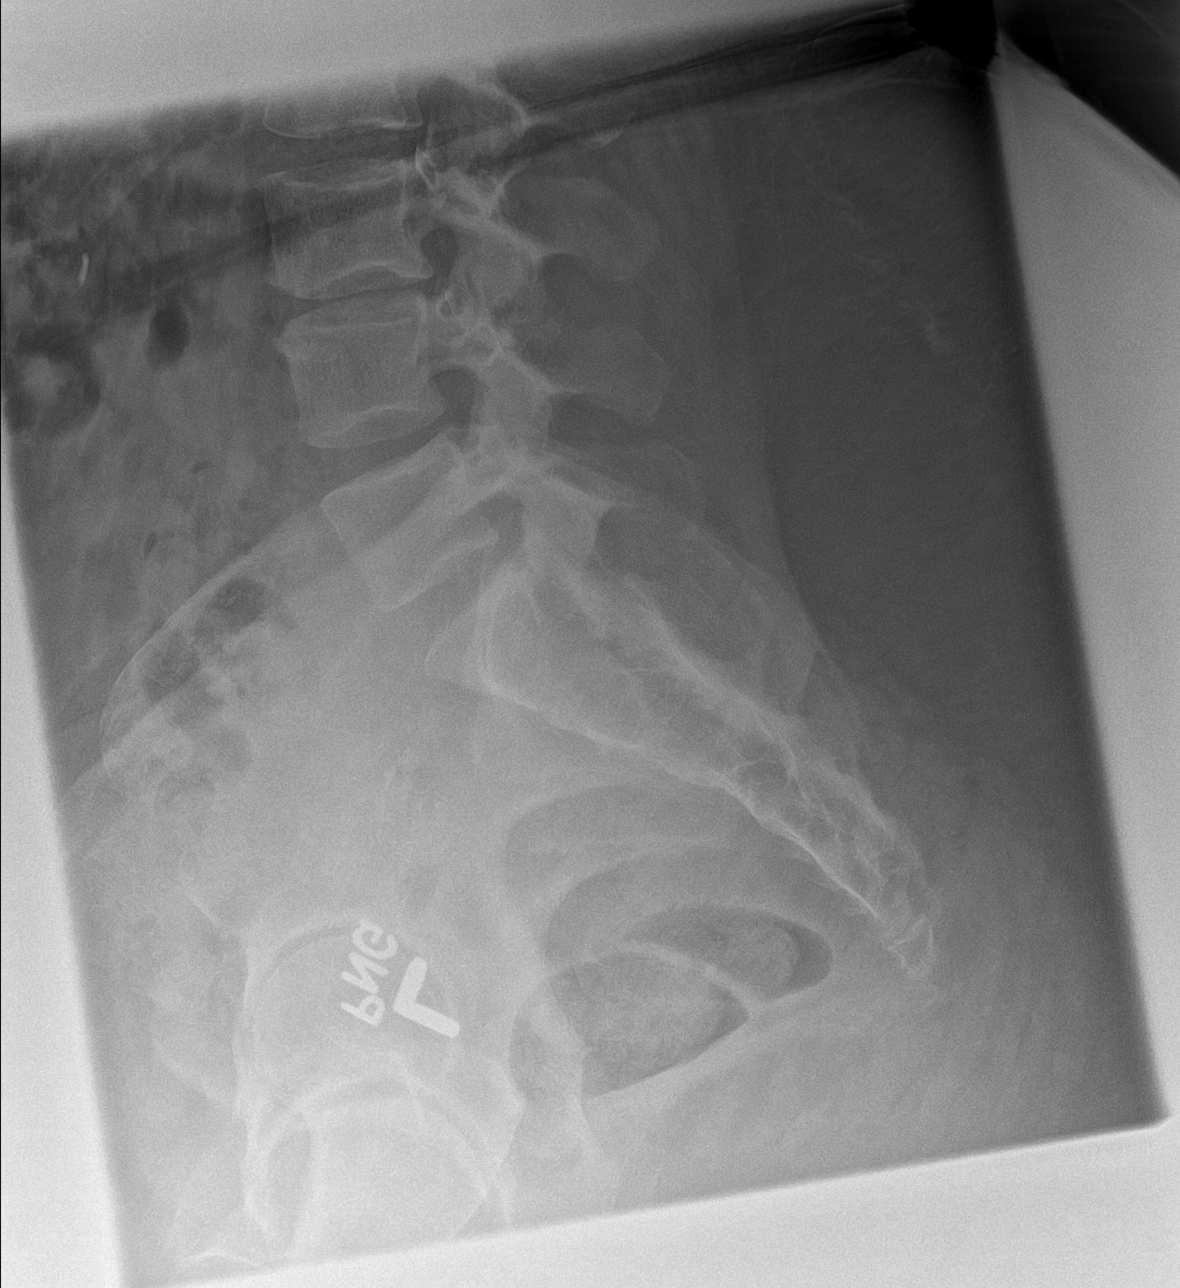

[3 of 3 positions shown; findings below may reference images not displayed]

FINDINGS: Five lumbar type vertebral segments. Vertebral body heights and
alignment are maintained. No fracture identified. Intervertebral
disc spaces are well preserved. Minimal degenerative endplate
changes. Minimal lower lumbar facet arthrosis.
IMPRESSION: Minimal lumbar spondylosis. No acute findings.

## 2022-06-16 NOTE — Telephone Encounter (Signed)
Last documented mammogram 09/27/17  LVM instructions for pt to call back to schedule mammogram - please transfer to [facility]

## 2022-06-22 NOTE — Telephone Encounter (Signed)
Pt confirms that last documented mammogram was in 2018.  Mammogram options given, pt chooses mammogram bus on Aug 11 at [place] location. No familial history of breast cancer or for herself, no breast surgeries, no transfer assistance needed.

## 2022-06-30 ENCOUNTER — Encounter: Payer: Self-pay | Admitting: Internal Medicine

## 2022-06-30 ENCOUNTER — Ambulatory Visit (INDEPENDENT_AMBULATORY_CARE_PROVIDER_SITE_OTHER): Payer: BC Managed Care – PPO | Admitting: Internal Medicine

## 2022-06-30 VITALS — BP 146/92 | HR 60 | Temp 98.6°F | Ht 68.0 in | Wt 287.0 lb

## 2022-06-30 DIAGNOSIS — Z79899 Other long term (current) drug therapy: Secondary | ICD-10-CM

## 2022-06-30 DIAGNOSIS — I10 Essential (primary) hypertension: Secondary | ICD-10-CM

## 2022-06-30 DIAGNOSIS — R6882 Decreased libido: Secondary | ICD-10-CM

## 2022-06-30 DIAGNOSIS — Z9884 Bariatric surgery status: Secondary | ICD-10-CM

## 2022-06-30 DIAGNOSIS — F4329 Adjustment disorder with other symptoms: Secondary | ICD-10-CM | POA: Diagnosis not present

## 2022-06-30 DIAGNOSIS — N959 Unspecified menopausal and perimenopausal disorder: Secondary | ICD-10-CM

## 2022-06-30 MED ORDER — AMITRIPTYLINE HCL 10 MG PO TABS
20.0000 mg | ORAL_TABLET | Freq: Every day | ORAL | 2 refills | Status: DC
Start: 2022-06-30 — End: 2022-07-02

## 2022-06-30 MED ORDER — AMLODIPINE BESYLATE-VALSARTAN 5-160 MG PO TABS: 1.0000 | ORAL_TABLET | Freq: Every day | ORAL | 1 refills | Status: DC

## 2022-06-30 NOTE — Patient Instructions (Addendum)
Some of sx seem menopausal  and  want to get gyne to see you to see if medications will help .  May need to stop the amitriptyline .  Will change bp medications   Stop the verapamil  and change to  new medication amlodipine/ valsartan  5/160  If bp  not controlling after 3-4 weeks we can increase dosage of this medication.   Stay on the  hctz .  Plan ROV  in a month .  Yes counseling may help.

## 2022-06-30 NOTE — Progress Notes (Signed)
Chief Complaint  Patient presents with   Hypertension    150 and 160 range     HPI: Brooke Le 56 y.o. come in for Chronic disease management  Having a hard time recently multiple family losses menopausal not sleeping as well some symptoms decreased libido is affecting relationships.  Irritable and tearful at times.  Headaches are stable needs refill of amitriptyline Blood pressures not been well controlled and she has gained weight recently but has been taking medicine regularly HCTZ and verapamil to 40 at bedtime.  No current chest pain shortness of breath it is new.  Last menstrual period may be almost 2 years ago last Pap  08/2019 ROS: See pertinent positives and negatives per HPI.  Past Medical History:  Diagnosis Date   Allergic rhinitis    Allergy    Anemia, iron deficiency    Anxiety    Fibromyalgia    Gynecological examination    Dr. 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   Qualifier: Diagnosis of  By: Regis Bill MD, Standley Brooking    Syncope    ER evaluation in 2008; had anemia then    Family History  Problem Relation Age of Onset   Alcohol abuse Other    Diabetes Other    Hypertension Other    Prostate cancer Father    Esophageal cancer Maternal Aunt    Colon cancer Maternal Uncle    Colon cancer Cousin    Colon cancer Other    Rectal cancer Neg Hx    Stomach cancer Neg Hx     Social History   Socioeconomic History   Marital status: Legally Separated    Spouse name: Not on file   Number of children: Not on file   Years of education: Not on file   Highest education level: Some college, no degree  Occupational History   Not on file  Tobacco Use   Smoking status: Never   Smokeless tobacco: Never   Vaping Use   Vaping Use: Never used  Substance and Sexual Activity   Alcohol use: Yes    Comment: socially    Drug use: Never   Sexual activity: Not on file  Other Topics Concern   Not on file  Social History Narrative   Married   Regular Exercise- no   Laid off [employer]- July 2011 lost insurance in [place] 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    No pets             Social Determinants of Health   Financial Resource Strain: [place]  (12/02/2021)   Overall Financial Resource Strain (CARDIA)    Difficulty of Paying Living Expenses: Not very hard  Food Insecurity: No Food Insecurity (12/02/2021)   Hunger Vital Sign    Worried About Running Out of Food in the Last Year: Never true    Ran Out of Food in the Last Year: Never true  Transportation Needs: No Transportation Needs (12/02/2021)   PRAPARE - Hydrologist (Medical): No  Lack of Transportation (Non-Medical): No  Physical Activity: Inactive (12/02/2021)   Exercise Vital Sign    Days of Exercise per Week: 1 day    Minutes of Exercise per Session: 0 min  Stress: Stress Concern Present (12/02/2021)   [place]    Feeling of Stress : To some extent  Social Connections: Socially Integrated (12/02/2021)   Social Connection and Isolation Panel [NHANES]    Frequency of Communication with Friends and Family: Three times a week    Frequency of Social Gatherings with Friends and Family: Once a week    Attends Religious Services: More than 4 times per year    Active Member of [employer] or Organizations: Yes    Attends Archivist Meetings: 1 to 4 times per year    Marital Status: Living with partner    Outpatient Medications Prior to Visit  Medication Sig Dispense Refill   ascorbic acid (VITAMIN C) 500 MG tablet Take 1 tablet (500 mg total) by mouth daily.  90 tablet 1   hydrochlorothiazide (HYDRODIURIL) 25 MG tablet TAKE 1 TABLET BY MOUTH EVERY DAY 90 tablet 0   naproxen (NAPROSYN) 500 MG tablet Take 1 tablet (500 mg total) by mouth 2 (two) times daily with a meal. 30 tablet 0   vitamin B-12 (CYANOCOBALAMIN) 1000 MCG tablet Take 1,000 mcg by mouth daily.     Vitamin D, Ergocalciferol, (DRISDOL) 1.25 MG (50000 UNIT) CAPS capsule Take 1 capsule (50,000 Units total) by mouth every 7 (seven) days. 12 capsule 0   Zinc 50 MG TABS Take 1 tablet by mouth daily.     amitriptyline (ELAVIL) 10 MG tablet Take 2 tablets (20 mg total) by mouth at bedtime. 180 tablet 2   verapamil (CALAN-SR) 240 MG CR tablet TAKE 1 TABLET BY MOUTH EVERYDAY AT BEDTIME 90 tablet 0   No facility-administered medications prior to visit.     EXAM:  BP (!) 146/92 (BP Location: Left Arm, Patient Position: Sitting, Cuff Size: Normal)   Pulse 60   Temp 98.6 F (37 C) (Oral)   Ht '5\' 8"'$  (1.727 m)   Wt 287 lb (130.2 kg)   LMP 10/04/2018 (Approximate)   SpO2 98%   BMI 43.64 kg/m   Body mass index is 43.64 kg/m.  GENERAL: vitals reviewed and listed above, alert, oriented, appears well hydrated and in no acute distress HEENT: atraumatic, conjunctiva  clear, no obvious abnormalities on inspection of external nose and ears  NECK: no obvious masses on inspection palpation  LUNGS: clear to auscultation bilaterally, no wheezes, rales or rhonchi, good air movement CV: HRRR, no clubbing cyanosis or  peripheral edema nl cap refill  Abdomen soft without again a megaly guarding or rebound MS: moves all extremities without noticeable focal  abnormality PSYCH: pleasant and cooperative, cognition is intact appears slightly down but no acute findings. Lab Results  Component Value Date   WBC 4.5 04/14/2022   HGB 12.4 04/14/2022   HCT 37.4 04/14/2022   PLT 229.0 04/14/2022   GLUCOSE 94 04/14/2022   CHOL 177 04/14/2022   TRIG 59.0 04/14/2022   HDL 52.40 04/14/2022   LDLCALC 113 (H)  04/14/2022   ALT 14 04/14/2022   AST 17 04/14/2022   NA 141 04/14/2022   K 4.2 04/14/2022   CL 106 04/14/2022   CREATININE 1.05 04/14/2022   BUN 15 04/14/2022   CO2 30 04/14/2022   TSH 0.94 04/14/2022   HGBA1C 5.6 04/14/2022  BP Readings from Last 3 Encounters:  06/30/22 (!) 146/92  12/02/21 138/84  04/30/21 (!) 140/96   Wt Readings from Last 3 Encounters:  06/30/22 287 lb (130.2 kg)  12/02/21 273 lb 6.4 oz (124 kg)  04/30/21 276 lb 9.6 oz (125.5 kg)    ASSESSMENT AND PLAN:  Discussed the following assessment and plan:  Essential hypertension  Medication management  Stress and adjustment reaction  Menopausal disorder - Plan: Ambulatory referral to Gynecology  Status post bariatric surgery  Morbid obesity ([place])  Decreased libido - Plan: Ambulatory referral to Gynecology Weight is increased probably related to lifestyle recent losses and stress Agree with establishing again with a counselor that would be helpful with her multiple losses and stresses I would like her to see GYN in regard to menopausal symptoms question if an SSRI or even hormone replacement at low dose would be helpful for her decreased libido and menopausal symptoms. We will alter her blood pressure medications in the meantime and try to get better control. We will stop the K. [place] and change to valsartan amlodipine 160/5 with option to increase dosing formulation plan stay on HCTZ Plan OV in a month blood pressure assessment and medication. -Patient advised to return or notify health care team  if  new concerns arise.  Patient Instructions  Some of sx seem menopausal  and  want to get gyne to see you to see if medications will help .  May need to stop the amitriptyline .  Will change bp medications   Stop the verapamil  and change to  new medication amlodipine/ valsartan  5/160  If bp  not controlling after 3-4 weeks we can increase dosage of this medication.   Stay on the  hctz .  Plan ROV  in  a month .  Yes counseling may help.    Standley Brooking. Jaqualyn Juday M.D.

## 2022-07-02 MED ORDER — AMITRIPTYLINE HCL 10 MG PO TABS
20.0000 mg | ORAL_TABLET | Freq: Every day | ORAL | 2 refills | Status: DC
Start: 2022-07-02 — End: 2023-01-27

## 2022-07-02 NOTE — Addendum Note (Signed)
Addended byBurnis Medin on: 07/02/2022 09:14 AM   Modules accepted: Orders

## 2022-07-13 ENCOUNTER — Other Ambulatory Visit: Payer: Self-pay | Admitting: Internal Medicine

## 2022-07-13 DIAGNOSIS — Z139 Encounter for screening, unspecified: Secondary | ICD-10-CM

## 2022-07-15 ENCOUNTER — Other Ambulatory Visit: Payer: Self-pay | Admitting: Internal Medicine

## 2022-07-23 ENCOUNTER — Ambulatory Visit
Admission: RE | Admit: 2022-07-23 | Discharge: 2022-07-23 | Disposition: A | Discharge status: Home / Self Care | Payer: BC Managed Care – PPO | Source: Ambulatory Visit | Attending: Internal Medicine | Admitting: Internal Medicine

## 2022-07-23 DIAGNOSIS — Z139 Encounter for screening, unspecified: Secondary | ICD-10-CM

## 2022-07-25 ENCOUNTER — Other Ambulatory Visit: Payer: Self-pay | Admitting: Internal Medicine

## 2022-08-04 ENCOUNTER — Other Ambulatory Visit: Payer: Self-pay | Admitting: Internal Medicine

## 2022-08-08 ENCOUNTER — Other Ambulatory Visit: Payer: Self-pay | Admitting: Internal Medicine

## 2022-08-15 ENCOUNTER — Telehealth: Payer: BC Managed Care – PPO | Admitting: Physician Assistant

## 2022-08-15 DIAGNOSIS — J069 Acute upper respiratory infection, unspecified: Secondary | ICD-10-CM

## 2022-08-15 MED ORDER — FLUTICASONE PROPIONATE 50 MCG/ACT NA SUSP: 2.0000 | Freq: Every day | NASAL | 0 refills | Status: DC

## 2022-08-15 MED ORDER — BENZONATATE 100 MG PO CAPS: 100.0000 mg | ORAL_CAPSULE | Freq: Three times a day (TID) | ORAL | 0 refills | Status: DC | PRN

## 2022-08-15 NOTE — Progress Notes (Signed)
I have spent 5 minutes in review of e-visit questionnaire, review and updating patient chart, medical decision making and response to patient.   Perel Hauschild Cody Meshelle Holness, PA-C    

## 2022-08-15 NOTE — Progress Notes (Signed)

## 2022-08-18 ENCOUNTER — Other Ambulatory Visit (INDEPENDENT_AMBULATORY_CARE_PROVIDER_SITE_OTHER): Payer: BC Managed Care – PPO

## 2022-08-18 DIAGNOSIS — E559 Vitamin D deficiency, unspecified: Secondary | ICD-10-CM | POA: Diagnosis not present

## 2022-08-18 LAB — VITAMIN D 25 HYDROXY (VIT D DEFICIENCY, FRACTURES): VITD: 36.56 ng/mL (ref 30.00–100.00)

## 2022-08-22 NOTE — Progress Notes (Signed)
Now normal  vit d levelwill review at upcoming visit

## 2022-08-22 NOTE — Progress Notes (Signed)
Chief Complaint  Patient presents with   Follow-up    HPI: Brooke Le 55 y.o. come in for Chronic disease management  Last ov 7 23  bp was  up and not controlled  and some sx poss menopausal  Bp seems to be better   on new med eval   that was up 1 day in the 150 range. Saw gyne and now on some HRT may be helping hot flashes. Work part time   and on  out from illness   2 weeks of Left knee  pain    and  Now  numbing in  pain  lateral thigh but no injury or weakness no swelling  using biofreeze  Requested  temp sick leave.  [place].   Flu virus and fever since last  Wednesday   and then  went back  and now out to 19th.    Sept.   Did a virtual .  Cough is better  no sob  s.   Still interested in help with her weight after her bypass or bariatric surgery and she was able to get her weight down to 199 range believes its way back up because of stress snacks in the afternoon and working sometimes salmon with occasional chips sometimes [place] fries.  ROS: See pertinent positives and negatives per HPI.  Past Medical History:  Diagnosis Date   Allergic rhinitis    Allergy    Anemia, iron deficiency    Anxiety    Fibromyalgia    Gynecological examination    Dr. 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   Qualifier: Diagnosis of  By: Regis Bill MD, Standley Brooking    Syncope    ER evaluation in 2008; had anemia then    Family History  Problem Relation Age of Onset   Prostate cancer Father    Esophageal cancer Maternal Aunt    Colon cancer Maternal Uncle    Colon cancer Cousin    Alcohol abuse Other    Diabetes Other    Hypertension Other    Colon cancer Other    Rectal cancer Neg Hx    Stomach cancer Neg Hx    Breast cancer Neg Hx      Social History   Socioeconomic History   Marital status: Legally Separated    Spouse name: Not on file   Number of children: Not on file   Years of education: Not on file   Highest education level: Some college, no degree  Occupational History   Not on file  Tobacco Use   Smoking status: Never   Smokeless tobacco: Never  Vaping Use   Vaping Use: Never used  Substance and Sexual Activity   Alcohol use: Yes    Comment: socially    Drug use: Never   Sexual activity: Not on file  Other Topics Concern   Not on file  Social History Narrative   Married   Regular Exercise- no   Laid off [employer]- July 2011 lost insurance in [place] to work at [employer] long shifts    Now am Actuary   hh of 6  teens    No pets             Social Determinants of Health   Financial Resource Strain: Low Risk  (12/02/2021)   Overall Financial Resource Strain (CARDIA)    Difficulty of Paying Living Expenses: Not very hard  Food Insecurity: No Food Insecurity (12/02/2021)   Hunger Vital Sign    Worried About Running Out of Food in the Last Year: Never true    Ran Out of Food in the Last Year: Never true  Transportation Needs: No Transportation Needs (12/02/2021)   PRAPARE - Hydrologist (Medical): No    Lack of Transportation (Non-Medical): No  Physical Activity: Inactive (12/02/2021)   Exercise Vital Sign    Days of Exercise per Week: 1 day    Minutes of Exercise per Session: 0 min  Stress: Stress Concern Present (12/02/2021)   [place]    Feeling of Stress : To some extent  Social Connections: Socially Integrated (12/02/2021)   Social Connection and Isolation Panel [NHANES]    Frequency of Communication with Friends and Family: Three times a week    Frequency of Social Gatherings with Friends and Family: Once a week    Attends Religious Services: More  than 4 times per year    Active Member of [employer] or Organizations: Yes    Attends Archivist Meetings: 1 to 4 times per year    Marital Status: Living with partner    Outpatient Medications Prior to Visit  Medication Sig Dispense Refill   amitriptyline (ELAVIL) 10 MG tablet Take 2 tablets (20 mg total) by mouth at bedtime. 180 tablet 2   amLODipine-valsartan (EXFORGE) 5-160 MG tablet TAKE 1 TABLET BY MOUTH EVERY DAY 90 tablet 0   ascorbic acid (VITAMIN C) 500 MG tablet Take 1 tablet (500 mg total) by mouth daily. 90 tablet 1   diclofenac Sodium (VOLTAREN) 1 % GEL Apply topically.     estradiol (CLIMARA - DOSED IN MG/24 HR) 0.05 mg/24hr patch Apply 1 patch every week by transdermal route for 28 days.     fluticasone (FLONASE) 50 MCG/ACT nasal spray Place 2 sprays into both nostrils daily. 16 g 0   hydrochlorothiazide (HYDRODIURIL) 25 MG tablet TAKE 1 TABLET BY MOUTH EVERY DAY 90 tablet 0   naproxen (NAPROSYN) 500 MG tablet Take 1 tablet (500 mg total) by mouth 2 (two) times daily with a meal. 30 tablet 0   progesterone (PROMETRIUM) 200 MG capsule Take 1 capsule every day by oral route for 12 days.     benzonatate (TESSALON) 100 MG capsule Take 1 capsule (100 mg total) by mouth 3 (three) times daily as needed for cough. (Patient not taking: Reported on 08/23/2022) 30 capsule 0   vitamin B-12 (CYANOCOBALAMIN) 1000 MCG tablet Take 1,000 mcg by mouth daily.     Vitamin D, Ergocalciferol, (DRISDOL) 1.25 MG (50000 UNIT) CAPS capsule Take 1 capsule (50,000 Units total) by mouth every 7 (seven) days. 12 capsule 0   Zinc 50 MG TABS Take 1 tablet by mouth daily.     No facility-administered medications prior to visit.     EXAM:  BP 118/80 (BP Location: Left Arm, Patient Position: Sitting, Cuff Size: Large)   Pulse 79   Temp 98.1 F (36.7 C) (Oral)   Wt 287 lb 3.2 oz (130.3 kg)   LMP 10/04/2018 (Approximate)   SpO2 95%   BMI 43.67 kg/m  Body mass index is 43.67 kg/m.  GENERAL:  vitals reviewed and listed above, alert, oriented, appears well hydrated and in no acute distress in dark glasses  HEENT: atraumatic, conjunctiva  clear, no obvious abnormalities on inspection of external nose and ears OP : no lesion edema or exudate  NECK: no obvious masses on inspection palpation  LUNGS: clear to auscultation bilaterally, no wheezes, rales or rhonchi, good air movement CV: HRRR, no clubbing cyanosis or  peripheral edema nl cap refill  Abdomen:  Sof,t normal bowel sounds without hepatosplenomegaly, no guarding rebound or masses no CVA tenderness MS: moves all extremities without noticeable focal  abnormality PSYCH: pleasant and cooperative, no obvious depression or anxiety Lab Results  Component Value Date   WBC 4.5 04/14/2022   HGB 12.4 04/14/2022   HCT 37.4 04/14/2022   PLT 229.0 04/14/2022   GLUCOSE 92 08/23/2022   CHOL 177 04/14/2022   TRIG 59.0 04/14/2022   HDL 52.40 04/14/2022   LDLCALC 113 (H) 04/14/2022   ALT 14 04/14/2022   AST 17 04/14/2022   NA 140 08/23/2022   K 4.2 08/23/2022   CL 107 08/23/2022   CREATININE 1.05 08/23/2022   BUN 16 08/23/2022   CO2 27 08/23/2022   TSH 0.94 04/14/2022   HGBA1C 5.6 04/14/2022   BP Readings from Last 3 Encounters:  08/23/22 118/80  06/30/22 (!) 146/92  12/02/21 138/84   Wt Readings from Last 3 Encounters:  08/23/22 287 lb 3.2 oz (130.3 kg)  06/30/22 287 lb (130.2 kg)  12/02/21 273 lb 6.4 oz (124 kg)   Lab Results  Component Value Date   VITAMINB12 368 04/14/2022    ASSESSMENT AND PLAN:  Discussed the following assessment and plan:  Essential hypertension - Improved current regimen recheck BMP if okay continue for a year - Plan: Basic metabolic panel, Alkaline phosphatase, bone specific, Alkaline phosphatase, bone specific, Basic metabolic panel  Medication management - Plan: Basic metabolic panel, Alkaline phosphatase, bone specific, Alkaline phosphatase, bone specific, Basic metabolic panel  Morbid  obesity (HCC) - weight  way back up despite history of bariatric surgery discussed options add Wegovy as possible risk benefit. - Plan: Basic metabolic panel, Alkaline phosphatase, bone specific, Alkaline phosphatase, bone specific, Basic metabolic panel  Elevated alkaline phosphatase level - nl ggt  vit d now back up will decide if further eval such as bone scan or endo to help  - Plan: Basic metabolic panel, Alkaline phosphatase, bone specific, Alkaline phosphatase, bone specific, Basic metabolic panel  Left knee pain, unspecified chronicity  Status post bariatric surgery Updated labs  Obesity addressed  sp bariatric surgery  consider adding medication if covered by insurance  Counseled  Hs of elevated alk phos   poss bone effect    uncertain if related to metabolic bypass  surgeyr   vit d levels dsicuss  -Patient advised to return or notify health care team  if  new concerns arise.  Patient Instructions  Bp is better   stay on same  Checking  potassium etc today . Address  snacking and food cjoiced  We will add wegovy  as discussed  Plan fu check vitual ok in a month  to address adjusting doses.  And update weight   Will  consider further check  for the elevated alk phos level .  Can try  topical voltaren  gel on the knee area 4 x per day and if  on going 3+ weeks we can do referral to orthopedics or sports  medicine .   Lungs are clear today .    Standley Brooking. Remijio Holleran M.D.

## 2022-08-23 ENCOUNTER — Ambulatory Visit: Payer: BC Managed Care – PPO | Admitting: Internal Medicine

## 2022-08-23 ENCOUNTER — Encounter: Payer: Self-pay | Admitting: Internal Medicine

## 2022-08-23 VITALS — BP 118/80 | HR 79 | Temp 98.1°F | Wt 287.2 lb

## 2022-08-23 DIAGNOSIS — R748 Abnormal levels of other serum enzymes: Secondary | ICD-10-CM | POA: Diagnosis not present

## 2022-08-23 DIAGNOSIS — I10 Essential (primary) hypertension: Secondary | ICD-10-CM

## 2022-08-23 DIAGNOSIS — Z79899 Other long term (current) drug therapy: Secondary | ICD-10-CM

## 2022-08-23 DIAGNOSIS — M25562 Pain in left knee: Secondary | ICD-10-CM

## 2022-08-23 DIAGNOSIS — Z9884 Bariatric surgery status: Secondary | ICD-10-CM

## 2022-08-23 LAB — BASIC METABOLIC PANEL
BUN: 16 mg/dL (ref 6–23)
CO2: 27 mEq/L (ref 19–32)
Calcium: 8.8 mg/dL (ref 8.4–10.5)
Chloride: 107 mEq/L (ref 96–112)
Creatinine, Ser: 1.05 mg/dL (ref 0.40–1.20)
GFR: 59.42 mL/min — ABNORMAL LOW (ref >60.00)
Glucose, Bld: 92 mg/dL (ref 70–99)
Potassium: 4.2 mEq/L (ref 3.5–5.1)
Sodium: 140 mEq/L (ref 135–145)

## 2022-08-23 MED ORDER — SEMAGLUTIDE-WEIGHT MANAGEMENT 0.25 MG/0.5ML ~~LOC~~ SOAJ: 0.2500 mg | SUBCUTANEOUS | 1 refills | Status: DC

## 2022-08-23 NOTE — Patient Instructions (Addendum)
Bp is better   stay on same  Checking  potassium etc today . Address  snacking and food cjoiced  We will add wegovy  as discussed  Plan fu check vitual ok in a month  to address adjusting doses.  And update weight   Will  consider further check  for the elevated alk phos level .  Can try  topical voltaren  gel on the knee area 4 x per day and if  on going 3+ weeks we can do referral to orthopedics or sports medicine .   Lungs are clear today .

## 2022-08-25 LAB — ALKALINE PHOSPHATASE, BONE SPECIFIC: ALKALINE PHOSPHATASE, BONE SPECIFIC: 30.9 mcg/L — ABNORMAL HIGH (ref 5.6–29.0)

## 2022-09-06 NOTE — Progress Notes (Deleted)
   I, Mcihael Hinderman, LAT, ATC acting as a scribe for Evan Corey, MD.  Brooke Le is a 56 y.o. female who presents to [facility] today for L knee pain. Pt was previously seen by Dr. Corey on 12/31/20 for L wrist tenosynovitis. Today, pt c/o L knee pain ongoing since early Sept. Pt locates pain to   L Knee swelling: Mechanical symptoms: Radiates: Aggravates: Treatments tried:  Dx imaging: 06/17/20 L-spine & tib/fib XR   Pertinent review of systems: ***  Relevant historical information: ***   Exam:  LMP 10/04/2018 (Approximate)  General: Well Developed, well nourished, and in no acute distress.   MSK: ***    Lab and Radiology Results No results found for this or any previous visit (from the past 72 hour(s)). No results found.     Assessment and Plan: 56 y.o. female with ***   PDMP not reviewed this encounter. No orders of the defined types were placed in this encounter.  No orders of the defined types were placed in this encounter.    Discussed warning signs or symptoms. Please see discharge instructions. Patient expresses understanding.   ***  

## 2022-09-07 ENCOUNTER — Ambulatory Visit: Payer: BC Managed Care – PPO | Admitting: Family Medicine

## 2022-09-12 ENCOUNTER — Other Ambulatory Visit: Payer: Self-pay | Admitting: Internal Medicine

## 2022-09-29 ENCOUNTER — Telehealth: Payer: BC Managed Care – PPO | Admitting: Nurse Practitioner

## 2022-09-29 DIAGNOSIS — R42 Dizziness and giddiness: Secondary | ICD-10-CM | POA: Diagnosis not present

## 2022-09-29 MED ORDER — MECLIZINE HCL 25 MG PO TABS: 25.0000 mg | ORAL_TABLET | Freq: Three times a day (TID) | ORAL | 0 refills | Status: DC | PRN

## 2022-09-29 MED ORDER — ONDANSETRON HCL 4 MG PO TABS: 4.0000 mg | ORAL_TABLET | Freq: Three times a day (TID) | ORAL | 0 refills | Status: DC | PRN

## 2022-09-29 NOTE — Progress Notes (Signed)
Virtual Visit Consent   Brooke Le, you are scheduled for a virtual visit with Brooke Le, [place], a [facility] provider, today.     Just as with appointments in the office, your consent must be obtained to participate.  Your consent will be active for this visit and any virtual visit you may have with one of our providers in the next 365 days.     If you have a MyChart account, a copy of this consent can be sent to you electronically.  All virtual visits are billed to your insurance company just like a traditional visit in the office.    As this is a virtual visit, video technology does not allow for your provider to perform a traditional examination.  This may limit your provider's ability to fully assess your condition.  If your provider identifies any concerns that need to be evaluated in person or the need to arrange testing (such as labs, EKG, etc.), we will make arrangements to do so.     Although advances in technology are sophisticated, we cannot ensure that it will always work on either your end or our end.  If the connection with a video visit is poor, the visit may have to be switched to a telephone visit.  With either a video or telephone visit, we are not always able to ensure that we have a secure connection.     I need to obtain your verbal consent now.   Are you willing to proceed with your visit today? YES   Brooke Le has provided verbal consent on 09/29/2022 for a virtual visit (video or telephone).   Brooke Hassell Done, FNP   Date: 09/29/2022 9:24 AM   Virtual Visit via Video Note   I, Brooke Le, connected with Brooke Le ([id number], 1966-06-15) on 09/29/22 at  9:45 AM EDT by a video-enabled telemedicine application and verified that I am speaking with the correct person using two identifiers.  Location: Patient: Virtual Visit Location Patient: Home Provider: Virtual Visit Location Provider: Mobile    I discussed the limitations of evaluation and management by telemedicine and the availability of in person appointments. The patient expressed understanding and agreed to proceed.    History of Present Illness: Brooke Le is a 56 y.o. who identifies as a female who was assigned female at birth, and is being seen today for vertigo.  HPI: Dizziness The problem occurs intermittently. Associated symptoms include nausea and vertigo. Pertinent negatives include no chills, congestion, coughing, headaches or visual change. The symptoms are aggravated by walking, standing and bending.    Review of Systems  Constitutional:  Negative for chills.  HENT:  Negative for congestion.   Respiratory:  Negative for cough.   Gastrointestinal:  Positive for nausea.  Neurological:  Positive for dizziness and vertigo. Negative for headaches.    Problems:  Patient Active Problem List   Diagnosis Date Noted   Multinodular goiter 11/27/2019   Elevated alkaline phosphatase level 11/27/2019   Pedal edema 09/13/2018   Stress 12/29/2012   Rhinitis recurrent  04/22/2012   MENORRHAGIA 01/01/2009   ANEMIA, B12 DEFICIENCY 10/30/2008   ALKALINE PHOSPHATASE, ELEVATED 08/21/2008   COMMON MIGRAINE 06/26/2008   ALLERGIC RHINITIS 06/26/2008   FIBROMYALGIA 05/20/2008   DEFICIENCY, VITAMIN D NOS 08/21/2007   PSTPRC STATUS, BARIATRIC SURGERY 08/21/2007   Iron deficiency anemia 07/14/2007   Essential hypertension 07/14/2007    Allergies:  Allergies  Allergen Reactions   Bee Pollen Swelling  Medications:  Current Outpatient Medications:    amitriptyline (ELAVIL) 10 MG tablet, Take 2 tablets (20 mg total) by mouth at bedtime., Disp: 180 tablet, Rfl: 2   amLODipine-valsartan (EXFORGE) 5-160 MG tablet, TAKE 1 TABLET BY MOUTH EVERY DAY, Disp: 90 tablet, Rfl: 0   ascorbic acid (VITAMIN C) 500 MG tablet, Take 1 tablet (500 mg total) by mouth daily., Disp: 90 tablet, Rfl: 1   diclofenac Sodium (VOLTAREN) 1  % GEL, Apply topically., Disp: , Rfl:    estradiol (CLIMARA - DOSED IN MG/24 HR) 0.05 mg/24hr patch, Apply 1 patch every week by transdermal route for 28 days., Disp: , Rfl:    fluticasone (FLONASE) 50 MCG/ACT nasal spray, Place 2 sprays into both nostrils daily., Disp: 16 g, Rfl: 0   hydrochlorothiazide (HYDRODIURIL) 25 MG tablet, TAKE 1 TABLET BY MOUTH EVERY DAY, Disp: 90 tablet, Rfl: 0   naproxen (NAPROSYN) 500 MG tablet, Take 1 tablet (500 mg total) by mouth 2 (two) times daily with a meal., Disp: 30 tablet, Rfl: 0   progesterone (PROMETRIUM) 200 MG capsule, Take 1 capsule every day by oral route for 12 days., Disp: , Rfl:    Semaglutide-Weight Management 0.25 MG/0.5ML SOAJ, Inject 0.25 mg into the skin once a week., Disp: 2 mL, Rfl: 1  Observations/Objective: Patient is well-developed, well-nourished in no acute distress.  Resting comfortably  at home.  Head is normocephalic, atraumatic.  No labored breathing.  Speech is clear and coherent with logical content.  Patient is alert and oriented at baseline.    Assessment and Plan:  Brooke Le in today with chief complaint of Dizziness   1. Vertigo Force fluids Fall precautions  Meds ordered this encounter  Medications   meclizine (ANTIVERT) 25 MG tablet    Sig: Take 1 tablet (25 mg total) by mouth 3 (three) times daily as needed for dizziness.    Dispense:  30 tablet    Refill:  0    Order Specific Question:   Supervising Provider    Answer:   LAMPTEY, PHILIP O [[id number]]   ondansetron (ZOFRAN) 4 MG tablet    Sig: Take 1 tablet (4 mg total) by mouth every 8 (eight) hours as needed for nausea or vomiting.    Dispense:  20 tablet    Refill:  0    Order Specific Question:   Supervising Provider    Answer:   Chase Picket [id number]       Follow Up Instructions: I discussed the assessment and treatment plan with the patient. The patient was provided an opportunity to ask questions and all were answered. The  patient agreed with the plan and demonstrated an understanding of the instructions.  A copy of instructions were sent to the patient via MyChart.  The patient was advised to call back or seek an in-person evaluation if the symptoms worsen or if the condition fails to improve as anticipated.  Time:  I spent 7 minutes with the patient via telehealth technology discussing the above problems/concerns.    Brooke Hassell Done, FNP

## 2022-09-29 NOTE — Patient Instructions (Signed)
Vertigo Vertigo is the feeling that you or the things around you are moving when they are not. This feeling can come and go at any time. Vertigo often goes away on its own. This condition can be dangerous if it happens when you are doing activities like driving or working with machines. Your doctor will do tests to find the cause of your vertigo. These tests will also help your doctor decide on the best treatment for you. Follow these instructions at home: Eating and drinking     Drink enough fluid to keep your pee (urine) pale yellow. Do not drink alcohol. Activity Return to your normal activities when your doctor says that it is safe. In the morning, first sit up on the side of the bed. When you feel okay, stand slowly while you hold onto something until you know that your balance is fine. Move slowly. Avoid sudden body or head movements or certain positions, as told by your doctor. Use a cane if you have trouble standing or walking. Sit down right away if you feel dizzy. Avoid doing any tasks or activities that can cause danger to you or others if you get dizzy. Avoid bending down if you feel dizzy. Place items in your home so that they are easy for you to reach without bending or leaning over. Do not drive or use machinery if you feel dizzy. General instructions Take over-the-counter and prescription medicines only as told by your doctor. Keep all follow-up visits. Contact a doctor if: Your medicine does not help your vertigo. Your problems get worse or you have new symptoms. You have a fever. You feel like you may vomit (nauseous), or this feeling gets worse. You start to vomit. Your family or friends see changes in how you act. You lose feeling (have numbness) in part of your body. You feel prickling and tingling in a part of your body. Get help right away if: You are always dizzy. You faint. You get very bad headaches. You get a stiff neck. Bright light starts to bother  you. You have trouble moving or talking. You feel weak in your hands, arms, or legs. You have changes in your hearing or in how you see (vision). These symptoms may be an emergency. Get help right away. Call your local emergency services (911 in the [place] Do not wait to see if the symptoms will go away. Do not drive yourself to the hospital. Summary Vertigo is the feeling that you or the things around you are moving when they are not. Your doctor will do tests to find the cause of your vertigo. You may be told to avoid some tasks, positions, or movements. Contact a doctor if your medicine is not helping, or if you have a fever, new symptoms, or a change in how you act. Get help right away if you get very bad headaches, or if you have changes in how you speak, hear, or see. This information is not intended to replace advice given to you by your health care provider. Make sure you discuss any questions you have with your health care provider. Document Revised: 10/29/2020 Document Reviewed: 10/29/2020 Elsevier Patient Education  2023 [employer]  

## 2022-10-04 ENCOUNTER — Ambulatory Visit: Payer: BC Managed Care – PPO | Admitting: Internal Medicine

## 2022-10-05 ENCOUNTER — Encounter: Payer: Self-pay | Admitting: Internal Medicine

## 2022-10-05 ENCOUNTER — Telehealth (INDEPENDENT_AMBULATORY_CARE_PROVIDER_SITE_OTHER): Payer: BC Managed Care – PPO | Admitting: Internal Medicine

## 2022-10-05 VITALS — Ht 68.0 in | Wt 277.8 lb

## 2022-10-05 DIAGNOSIS — E559 Vitamin D deficiency, unspecified: Secondary | ICD-10-CM

## 2022-10-05 DIAGNOSIS — R748 Abnormal levels of other serum enzymes: Secondary | ICD-10-CM | POA: Diagnosis not present

## 2022-10-05 DIAGNOSIS — I10 Essential (primary) hypertension: Secondary | ICD-10-CM | POA: Diagnosis not present

## 2022-10-05 DIAGNOSIS — Z79899 Other long term (current) drug therapy: Secondary | ICD-10-CM | POA: Diagnosis not present

## 2022-10-05 DIAGNOSIS — R42 Dizziness and giddiness: Secondary | ICD-10-CM

## 2022-10-05 DIAGNOSIS — Z9884 Bariatric surgery status: Secondary | ICD-10-CM

## 2022-10-05 MED ORDER — VITAMIN D (ERGOCALCIFEROL) 1.25 MG (50000 UNIT) PO CAPS: 50000.0000 [IU] | ORAL_CAPSULE | ORAL | 1 refills | Status: DC

## 2022-10-05 NOTE — Progress Notes (Signed)
Virtual Visit via Video Note  I connected with Brooke Le on '10 24 23 '$ at  3:30 PM EDT by a video enabled telemedicine application and verified that I am speaking with the correct person using two identifiers. Location patient: home Location provider:work  office Persons participating in the virtual visit: patient, provider  Patient aware  of the limitations of evaluation and management by telemedicine and  availability of in person appointments. and agreed to proceed.   HPI: Brooke Le presents for video visit Had episode of vertigo in the last few weeks was given a medicine and felt better within 48 hours. Then yesterday did have an episode where she felt very tired had lay down may be dizzy near faint sweaty lightheaded but no loss of consciousness when she got home took the blood pressure pulse was in the 90s blood pressure was "okay Other blood pressure readings 137/96 143/93 pulse 62 today 119/75/85 pulse She is currently trying to lose weight by doing a temporary fast from 8 pm  to noon  or l 1 PM for lunch.  Drinking lots of water urine looks almost like water. Is also on hormonal patches that is helped some of her menopausal symptoms. In regard to vitamin D when she ran out of the high-dose vitamin D she did not continue with over-the-counter's.  So she has been off the vitamin D for a while. We had ordered Ozempic at last visit but she has not heard from the pharmacy yet. ROS: See pertinent positives and negatives per HPI.  Past Medical History:  Diagnosis Date   Allergic rhinitis    Allergy    Anemia, iron deficiency    Anxiety    Fibromyalgia    Gynecological examination    Dr. 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/2007   Qualifier: Diagnosis of  By: Regis Bill MD, Standley Brooking    Syncope    ER evaluation in 2008; had anemia then    Past Surgical History:  Procedure Laterality Date   ADENOIDECTOMY     ESOPHAGOGASTRODUODENOSCOPY  10/31/2002   GASTRIC BYPASS     TUBAL LIGATION     UPPER GASTROINTESTINAL ENDOSCOPY      Family History  Problem Relation Age of Onset   Prostate cancer Father    Esophageal cancer Maternal Aunt    Colon cancer Maternal Uncle    Colon cancer Cousin    Alcohol abuse Other    Diabetes Other    Hypertension Other    Colon cancer Other    Rectal cancer Neg Hx    Stomach cancer Neg Hx    Breast cancer Neg Hx     Social History   Tobacco Use   Smoking status: Never   Smokeless tobacco: Never  Vaping Use   Vaping Use: Never used  Substance Use Topics   Alcohol use: Yes    Comment: socially    Drug use: Never      Current Outpatient Medications:    amitriptyline (ELAVIL) 10 MG tablet, Take 2 tablets (20 mg total) by mouth at bedtime., Disp: 180 tablet, Rfl: 2   amLODipine-valsartan (EXFORGE) 5-160 MG tablet, TAKE 1  TABLET BY MOUTH EVERY DAY, Disp: 90 tablet, Rfl: 0   ascorbic acid (VITAMIN C) 500 MG tablet, Take 1 tablet (500 mg total) by mouth daily., Disp: 90 tablet, Rfl: 1   diclofenac Sodium (VOLTAREN) 1 % GEL, Apply topically., Disp: , Rfl:    estradiol (CLIMARA - DOSED IN MG/24 HR) 0.05 mg/24hr patch, Apply 1 patch every week by transdermal route for 28 days., Disp: , Rfl:    fluticasone (FLONASE) 50 MCG/ACT nasal spray, Place 2 sprays into both nostrils daily., Disp: 16 g, Rfl: 0   hydrochlorothiazide (HYDRODIURIL) 25 MG tablet, TAKE 1 TABLET BY MOUTH EVERY DAY, Disp: 90 tablet, Rfl: 0   meclizine (ANTIVERT) 25 MG tablet, Take 1 tablet (25 mg total) by mouth 3 (three) times daily as needed for dizziness., Disp: 30 tablet, Rfl: 0   ondansetron (ZOFRAN) 4 MG tablet, Take 1 tablet (4 mg total) by mouth every 8 (eight) hours as needed for nausea or  vomiting., Disp: 20 tablet, Rfl: 0   Semaglutide-Weight Management 0.25 MG/0.5ML SOAJ, Inject 0.25 mg into the skin once a week., Disp: 2 mL, Rfl: 1   Vitamin D, Ergocalciferol, (DRISDOL) 1.25 MG (50000 UNIT) CAPS capsule, Take 1 capsule (50,000 Units total) by mouth every 7 (seven) days., Disp: 12 capsule, Rfl: 1  EXAM: BP Readings from Last 3 Encounters:  08/23/22 118/80  06/30/22 (!) 146/92  12/02/21 138/84   [place] 5  VITALS per patient if applicable:  GENERAL: alert, oriented, appears well and in no acute distress  HEENT: atraumatic, conjunttiva clear, no obvious abnormalities on inspection of external nose and ears  NECK: normal movements of the head and neck  LUNGS: on inspection no signs of respiratory distress, breathing rate appears normal, no obvious gross SOB, gasping or wheezing  CV: no obvious cyanosis  MS: moves all visible extremities without noticeable abnormality  PSYCH/NEURO: pleasant and cooperative, no obvious depression or anxiety, speech and thought processing grossly intact Lab Results  Component Value Date   WBC 4.5 04/14/2022   HGB 12.4 04/14/2022   HCT 37.4 04/14/2022   PLT 229.0 04/14/2022   GLUCOSE 92 08/23/2022   CHOL 177 04/14/2022   TRIG 59.0 04/14/2022   HDL 52.40 04/14/2022   LDLCALC 113 (H) 04/14/2022   ALT 14 04/14/2022   AST 17 04/14/2022   NA 140 08/23/2022   K 4.2 08/23/2022   CL 107 08/23/2022   CREATININE 1.05 08/23/2022   BUN 16 08/23/2022   CO2 27 08/23/2022   TSH 0.94 04/14/2022   HGBA1C 5.6 04/14/2022    ASSESSMENT AND PLAN:  Discussed the following assessment and plan:    ICD-10-CM   1. Essential hypertension  I10     2. Medication management  Z79.899     3. Morbid obesity ([place])  E66.01     4. Elevated alkaline phosphatase level  R74.8    off vit d  consoder endo or boney surey seems to be related to low vit d levels    5. Status post bariatric surgery  Z98.84     6. Vitamin D deficiency  E55.9     7.  Episode of dizziness  R42      Back to combination of things including the fasting blood pressure menopausal perhaps overhydration.  Want to avoid hyponatremia She has a wrist machine would like that correlated or else get a cuff that goes around the arm to be able to get accurate blood pressure readings  At this point go back  on high-dose vitamin D for 6 months this may actually correct the borderline alkaline phosphatase elevation is felt to be from bone. Plan lab work before she runs out vitamin D ,chemistries 3 to 4 months  Visit was cut short because of video link expired. Counseled.   Expectant management and discussion of plan and treatment with opportunity to ask questions and all were answered. The patient agreed with the plan and demonstrated an understanding of the instructions.   Advised to call back or seek an in-person evaluation if worsening  or having  further concerns  in interim. Return for chec bp monitor  lab in 3-4 months .    Shanon Ace, MD

## 2022-10-05 NOTE — Patient Instructions (Addendum)
Lets try high dose Vit for 6 months and then check lab before runs out .  To include vit d and alkaline phosphatase phosphorus and consider bone survey   Arrange appt  to get your bp wrist monitor to correlate with  office monitor  and then send in  2 readings twice a day for 5 days w pulse .   It is possible that the fasting and   bp could cause the sx you have had .  Hydrate but not overhydrated   urine should look pale yellow and not like water.

## 2022-10-13 ENCOUNTER — Ambulatory Visit: Payer: BC Managed Care – PPO

## 2022-10-22 NOTE — Progress Notes (Signed)
No Show for Nurse visit scheduled for BP check on 10/13/2022.

## 2022-11-01 ENCOUNTER — Other Ambulatory Visit: Payer: Self-pay | Admitting: Internal Medicine

## 2022-11-02 NOTE — Telephone Encounter (Signed)
Last refill-03-06-21* Last VV-10/05/22  Okay to send refill?

## 2022-11-03 MED ORDER — HYDROCHLOROTHIAZIDE 25 MG PO TABS: 25.0000 mg | ORAL_TABLET | Freq: Every day | ORAL | 0 refills | Status: DC

## 2022-11-10 ENCOUNTER — Other Ambulatory Visit: Payer: Self-pay | Admitting: Internal Medicine

## 2022-12-15 NOTE — Progress Notes (Deleted)
   I, Peterson Lombard, LAT, ATC acting as a scribe for [name], MD.  Brooke Le is a 57 y.o. female who presents to [place] at [facility] today for L knee pain. Pt was previously seen by Dr. Georgina Snell on 12/31/20 for L wrist tenosynovitis. Today, pt c/o L knee pain ongoing since early Sept. Pt locates pain to   L Knee swelling: Mechanical symptoms: Radiates: Aggravates: Treatments tried:  Dx imaging: 06/17/20 L-spine & tib/fib XR   Pertinent review of systems: ***  Relevant historical information: ***   Exam:  LMP 10/04/2018 (Approximate)  General: Well Developed, well nourished, and in no acute distress.   MSK: ***    Lab and Radiology Results No results found for this or any previous visit (from the past 72 hour(s)). No results found.     Assessment and Plan: 57 y.o. female with ***   PDMP not reviewed this encounter. No orders of the defined types were placed in this encounter.  No orders of the defined types were placed in this encounter.    Discussed warning signs or symptoms. Please see discharge instructions. Patient expresses understanding.   ***

## 2022-12-16 ENCOUNTER — Ambulatory Visit: Payer: BC Managed Care – PPO | Admitting: Family Medicine

## 2022-12-17 NOTE — Progress Notes (Deleted)
   I, Peterson Lombard, LAT, ATC acting as a scribe for Brooke Leader, MD.  LATISIA Le is a 57 y.o. female who presents to [place] at [facility] today for R knee pain. Pt was previously seen by Dr. Georgina Snell on 12/31/20 for L wrist tenosynovitis. Today, pt c/o R knee pain ongoing since early Sept. Pt locates pain to   R Knee swelling: Mechanical symptoms: Radiates: Aggravates: Treatments tried:  Dx imaging: 06/17/20 L-spine & tib/fib XR   Pertinent review of systems: ***  Relevant historical information: ***   Exam:  LMP 10/04/2018 (Approximate)  General: Well Developed, well nourished, and in no acute distress.   MSK: ***    Lab and Radiology Results No results found for this or any previous visit (from the past 72 hour(s)). No results found.     Assessment and Plan: 56 y.o. female with ***   PDMP not reviewed this encounter. No orders of the defined types were placed in this encounter.  No orders of the defined types were placed in this encounter.    Discussed warning signs or symptoms. Please see discharge instructions. Patient expresses understanding.   ***

## 2022-12-20 ENCOUNTER — Ambulatory Visit: Payer: BC Managed Care – PPO | Admitting: Family Medicine

## 2022-12-22 ENCOUNTER — Ambulatory Visit
Admission: RE | Admit: 2022-12-22 | Discharge: 2022-12-22 | Disposition: A | Discharge status: Home / Self Care | Payer: BC Managed Care – PPO | Source: Ambulatory Visit | Attending: Internal Medicine | Admitting: Internal Medicine

## 2022-12-22 VITALS — BP 149/91 | HR 65 | Temp 98.3°F | Resp 17

## 2022-12-22 DIAGNOSIS — M25561 Pain in right knee: Secondary | ICD-10-CM

## 2022-12-22 NOTE — ED Triage Notes (Signed)
Pt. Presents to UC w/ c/o sharp right knee pain that started @ the beginning of the year. Pt. States she is not aware of any injury or precipitating event to cause her symptoms.

## 2022-12-22 NOTE — ED Provider Notes (Signed)
[facility]    CSN: [id number] Arrival date & time: 12/22/22  1752      History   Chief Complaint Chief Complaint  Patient presents with   Knee Pain    Entered by patient    HPI SHAVY BEACHEM is a 57 y.o. female.    Knee Pain   Patient presents to urgent care with complaint of acute sharp right knee pain x 1 to 2 weeks.  She denies any injury or known precipitating event.  She states that her symptoms are worsened by climbing or descending stairs, by walking.  Past Medical History:  Diagnosis Date   Allergic rhinitis    Allergy    Anemia, iron deficiency    Anxiety    Fibromyalgia    Gynecological examination    Dr. 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   Qualifier: Diagnosis of  By: Regis Bill MD, Standley Brooking    Syncope    ER evaluation in 2008; had anemia then    Patient Active Problem List   Diagnosis Date Noted   Multinodular goiter 11/27/2019   Elevated alkaline phosphatase level 11/27/2019   Pedal edema 09/13/2018   Stress 12/29/2012   Rhinitis recurrent  04/22/2012   MENORRHAGIA 01/01/2009   ANEMIA, B12 DEFICIENCY 10/30/2008   ALKALINE PHOSPHATASE, ELEVATED 08/21/2008   COMMON MIGRAINE 06/26/2008   ALLERGIC RHINITIS 06/26/2008   FIBROMYALGIA 05/20/2008   DEFICIENCY, VITAMIN D NOS 08/21/2007   PSTPRC STATUS, BARIATRIC SURGERY 08/21/2007   Iron deficiency anemia 07/14/2007   Essential hypertension 07/14/2007    Past Surgical History:  Procedure Laterality Date   ADENOIDECTOMY     ESOPHAGOGASTRODUODENOSCOPY  10/31/2002   GASTRIC BYPASS     TUBAL LIGATION     UPPER GASTROINTESTINAL ENDOSCOPY      OB History   No obstetric history on file.      Home Medications    Prior to Admission  medications   Medication Sig Start Date End Date Taking? Authorizing Provider  amitriptyline (ELAVIL) 10 MG tablet Take 2 tablets (20 mg total) by mouth at bedtime. 07/02/22   Panosh, Standley Brooking, MD  amLODipine-valsartan (EXFORGE) 5-160 MG tablet TAKE 1 TABLET BY MOUTH EVERY DAY 11/01/22   Panosh, Standley Brooking, MD  ascorbic acid (VITAMIN C) 500 MG tablet TAKE 1 TABLET (500 MG TOTAL) BY MOUTH DAILY. 11/10/22   Panosh, Standley Brooking, MD  diclofenac Sodium (VOLTAREN) 1 % GEL Apply topically. 05/27/21   [provider]  estradiol (CLIMARA - DOSED IN MG/24 HR) 0.05 mg/24hr patch Apply 1 patch every week by transdermal route for 28 days.    [provider]  fluticasone (FLONASE) 50 MCG/ACT nasal spray Place 2 sprays into both nostrils daily. 08/15/22   Brunetta Jeans, PA-C  hydrochlorothiazide (HYDRODIURIL) 25 MG tablet Take 1 tablet (25 mg total) by mouth daily. 11/03/22   Panosh, Standley Brooking, MD  meclizine (ANTIVERT) 25 MG tablet Take 1 tablet (25 mg total) by mouth 3 (three) times daily as needed for dizziness. 09/29/22   Hassell Done, Mary-Margaret, FNP  ondansetron (ZOFRAN) 4 MG tablet Take 1 tablet (4 mg total) by mouth every 8 (eight) hours as needed  for nausea or vomiting. 09/29/22   Chevis Pretty, FNP  Semaglutide-Weight Management 0.25 MG/0.5ML SOAJ Inject 0.25 mg into the skin once a week. 08/23/22   Panosh, Standley Brooking, MD  Vitamin D, Ergocalciferol, (DRISDOL) 1.25 MG (50000 UNIT) CAPS capsule Take 1 capsule (50,000 Units total) by mouth every 7 (seven) days. 10/05/22   Panosh, Standley Brooking, MD    Family History Family History  Problem Relation Age of Onset   Prostate cancer Father    Esophageal cancer Maternal Aunt    Colon cancer Maternal Uncle    Colon cancer Cousin    Alcohol abuse Other    Diabetes Other    Hypertension Other    Colon cancer Other    Rectal cancer Neg Hx    Stomach cancer Neg Hx    Breast cancer Neg Hx     Social History Social History   Tobacco Use   Smoking  status: Never   Smokeless tobacco: Never  Vaping Use   Vaping Use: Never used  Substance Use Topics   Alcohol use: Yes    Comment: socially    Drug use: Never     Allergies   Bee pollen   Review of Systems Review of Systems   Physical Exam Triage Vital Signs ED Triage Vitals  Enc Vitals Group     BP 12/22/22 1825 (!) 149/91     Pulse Rate 12/22/22 1825 65     Resp 12/22/22 1825 17     Temp 12/22/22 1825 98.3 F (36.8 C)     Temp src --      SpO2 12/22/22 1825 96 %     Weight --      Height --      Head Circumference --      Peak Flow --      Pain Score 12/22/22 1826 7     Pain Loc --      Pain Edu? --      Excl. in [place]? --    No data found.  Updated Vital Signs BP (!) 149/91   Pulse 65   Temp 98.3 F (36.8 C)   Resp 17   LMP 10/04/2018 (Approximate)   SpO2 96%   Visual Acuity Right Eye Distance:   Left Eye Distance:   Bilateral Distance:    Right Eye Near:   Left Eye Near:    Bilateral Near:     Physical Exam Vitals reviewed.  Constitutional:      Appearance: Normal appearance.  Musculoskeletal:        General: Normal range of motion.  Skin:    General: Skin is warm and dry.  Neurological:     General: No focal deficit present.     Mental Status: She is alert and oriented to person, place, and time.  Psychiatric:        Mood and Affect: Mood normal.        Behavior: Behavior normal.      UC Treatments / Results  Labs (all labs ordered are listed, but only abnormal results are displayed) Labs Reviewed - No data to display  EKG   Radiology No results found.  Procedures Procedures (including critical care time)  Medications Ordered in UC Medications - No data to display  Initial Impression / Assessment and Plan / UC Course  I have reviewed the triage vital signs and the nursing notes.  Pertinent labs & imaging results that were available during my care of the patient were reviewed  by me and considered in my medical decision  making (see chart for details).   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  Recommended patient to be evaluated by orthopedic provider who can provide imaging necessary to diagnose her condition accurately.   Final Clinical Impressions(s) / UC Diagnoses   Final diagnoses:  None   Discharge Instructions   None    ED Prescriptions   None    PDMP not reviewed this encounter.   Rose Phi, [place] 12/22/22 1842

## 2022-12-22 NOTE — Discharge Instructions (Addendum)
Follow up here or with your primary care provider if your symptoms are worsening or not improving.     

## 2023-01-10 ENCOUNTER — Other Ambulatory Visit: Payer: BC Managed Care – PPO

## 2023-01-11 ENCOUNTER — Other Ambulatory Visit: Payer: BC Managed Care – PPO

## 2023-01-26 ENCOUNTER — Other Ambulatory Visit: Payer: Self-pay | Admitting: Internal Medicine

## 2023-02-04 ENCOUNTER — Other Ambulatory Visit: Payer: Self-pay | Admitting: Internal Medicine

## 2023-04-22 ENCOUNTER — Other Ambulatory Visit: Payer: Self-pay | Admitting: Sports Medicine

## 2023-04-22 ENCOUNTER — Ambulatory Visit (INDEPENDENT_AMBULATORY_CARE_PROVIDER_SITE_OTHER): Payer: BC Managed Care – PPO | Admitting: Sports Medicine

## 2023-04-22 VITALS — BP 122/82 | HR 83 | Ht 68.0 in | Wt 261.0 lb

## 2023-04-22 DIAGNOSIS — S86812A Strain of other muscle(s) and tendon(s) at lower leg level, left leg, initial encounter: Secondary | ICD-10-CM | POA: Diagnosis not present

## 2023-04-22 MED ORDER — PREDNISONE 10 MG (21) PO TBPK: ORAL_TABLET | ORAL | 0 refills | Status: DC

## 2023-04-22 MED ORDER — MELOXICAM 15 MG PO TABS: 15.0000 mg | ORAL_TABLET | Freq: Every day | ORAL | 0 refills | Status: DC

## 2023-04-22 NOTE — Patient Instructions (Addendum)
Good to see you - Start meloxicam 15 mg daily x2 weeks.  If still having pain after 2 weeks, complete 3rd-week of meloxicam. May use remaining meloxicam as needed once daily for pain control.  Do not to use additional NSAIDs while taking meloxicam.  May use Tylenol [phone] mg 2 to 3 times a day for breakthrough pain. Calf HEP Stay well hydrated to prevent cramping  As needed follow up

## 2023-04-22 NOTE — Progress Notes (Signed)
Brooke Le D.[facility] [address] [place] [address] Phone: [phone]   Assessment and Plan:     1. Strain of calf muscle, left, initial encounter -Chronic with exacerbation, initial sports medicine visit - Patient presents with pain primarily in left lateral calf muscle that is worsened with physical activity and most consistent with gastroc strain, primarily lateral body - Patient was concerned because she has had intermittent flares of similar pain off and on over the past 3 to 4 years.  Had previously unremarkable workup including unremarkable tib-fib x-ray, lumbar x-ray, lower extremity venous Doppler.  No signs of DVT on physical exam today -Do not recommend NSAID use due to history of gastric bypass - Start prednisone taper.  Prescription provided. - Recommended to stay well-hydrated to prevent cramping - Start HEP for calf    Pertinent previous records reviewed include tib-fib and lumbar x-rays from 06/17/2020   Follow Up: As needed if no improvement or worsening of symptoms   Subjective:   I, Brooke Le, am serving as a Neurosurgeon for Doctor [employer]  Chief Complaint: left leg pain   HPI:   06/17/2020 Pt is a 57 y/o female presenting w/ c/o L leg pain and swelling that has been worsening since late June. States swelling is better today because she has been elevating her legs.  She locates her symptoms to Lateral L leg that will be stiff and swollen.   Radiating pain:no L leg swelling: yes Aggravating factors: walking or going up or down stairs  Treatments tried: elevation, OTC cream    Diagnostic imaging: L LE venous doppler- 06/06/20  04/22/23 Patient is a 57 year old female complaining of left leg pain. Patient states she is having the same pain as when she saw Dr. Denyse Amass   Relevant Historical Information: Hypertension, fibromyalgia  Additional pertinent review of systems negative.   Current Outpatient  Medications:    amitriptyline (ELAVIL) 10 MG tablet, TAKE 2 TABLETS BY MOUTH AT BEDTIME., Disp: 180 tablet, Rfl: 0   amLODipine-valsartan (EXFORGE) 5-160 MG tablet, TAKE 1 TABLET BY MOUTH EVERY DAY, Disp: 90 tablet, Rfl: 0   ascorbic acid (VITAMIN C) 500 MG tablet, TAKE 1 TABLET (500 MG TOTAL) BY MOUTH DAILY., Disp: 90 tablet, Rfl: 1   diclofenac Sodium (VOLTAREN) 1 % GEL, Apply topically., Disp: , Rfl:    estradiol (CLIMARA - DOSED IN MG/24 HR) 0.05 mg/24hr patch, Apply 1 patch every week by transdermal route for 28 days., Disp: , Rfl:    fluticasone (FLONASE) 50 MCG/ACT nasal spray, Place 2 sprays into both nostrils daily., Disp: 16 g, Rfl: 0   hydrochlorothiazide (HYDRODIURIL) 25 MG tablet, TAKE 1 TABLET (25 MG TOTAL) BY MOUTH DAILY., Disp: 90 tablet, Rfl: 0   meclizine (ANTIVERT) 25 MG tablet, Take 1 tablet (25 mg total) by mouth 3 (three) times daily as needed for dizziness., Disp: 30 tablet, Rfl: 0   ondansetron (ZOFRAN) 4 MG tablet, Take 1 tablet (4 mg total) by mouth every 8 (eight) hours as needed for nausea or vomiting., Disp: 20 tablet, Rfl: 0   predniSONE (STERAPRED UNI-PAK 21 TAB) 10 MG (21) TBPK tablet, 12 day taper pack, use as directed, Disp: 21 tablet, Rfl: 0   Semaglutide-Weight Management 0.25 MG/0.5ML SOAJ, Inject 0.25 mg into the skin once a week., Disp: 2 mL, Rfl: 1   Vitamin D, Ergocalciferol, (DRISDOL) 1.25 MG (50000 UNIT) CAPS capsule, Take 1 capsule (50,000 Units total) by mouth every 7 (  seven) days., Disp: 12 capsule, Rfl: 1   Objective:     Vitals:   04/22/23 1319  BP: 122/82  Pulse: 83  SpO2: 100%  Weight: 261 lb (118.4 kg)  Height: 5\' 8"  (1.727 m)      Body mass index is 39.68 kg/m.    Physical Exam:      Electronically signed by:  Brooke Le D.[facility] 1:52 PM 04/22/23

## 2023-05-05 ENCOUNTER — Other Ambulatory Visit: Payer: Self-pay | Admitting: Internal Medicine

## 2023-05-06 ENCOUNTER — Other Ambulatory Visit: Payer: Self-pay | Admitting: Family

## 2023-05-11 ENCOUNTER — Telehealth: Payer: BC Managed Care – PPO | Admitting: Physician Assistant

## 2023-05-11 DIAGNOSIS — U071 COVID-19: Secondary | ICD-10-CM

## 2023-05-11 MED ORDER — PROMETHAZINE-DM 6.25-15 MG/5ML PO SYRP: 5.0000 mL | ORAL_SOLUTION | Freq: Four times a day (QID) | ORAL | 0 refills | Status: DC | PRN

## 2023-05-11 MED ORDER — NIRMATRELVIR/RITONAVIR (PAXLOVID) TABLET (RENAL DOSING): 2.0000 | ORAL_TABLET | Freq: Two times a day (BID) | ORAL | 0 refills | Status: AC

## 2023-05-11 NOTE — Progress Notes (Signed)
Virtual Visit Consent   Camas Kinsman Le, you are scheduled for a virtual visit with a [facility] provider today. Just as with appointments in the office, your consent must be obtained to participate. Your consent will be active for this visit and any virtual visit you may have with one of our providers in the next 365 days. If you have a MyChart account, a copy of this consent can be sent to you electronically.  As this is a virtual visit, video technology does not allow for your provider to perform a traditional examination. This may limit your provider's ability to fully assess your condition. If your provider identifies any concerns that need to be evaluated in person or the need to arrange testing (such as labs, EKG, etc.), we will make arrangements to do so. Although advances in technology are sophisticated, we cannot ensure that it will always work on either your end or our end. If the connection with a video visit is poor, the visit may have to be switched to a telephone visit. With either a video or telephone visit, we are not always able to ensure that we have a secure connection.  By engaging in this virtual visit, you consent to the provision of healthcare and authorize for your insurance to be billed (if applicable) for the services provided during this visit. Depending on your insurance coverage, you may receive a charge related to this service.  I need to obtain your verbal consent now. Are you willing to proceed with your visit today? Brooke Le has provided verbal consent on 05/11/2023 for a virtual visit (video or telephone). Brooke Loveless, PA-C  Date: 05/11/2023 12:16 PM  Virtual Visit via Video Note   IMargaretann Le, connected with  Brooke Le  ([id number], 04-14-1966) on 05/11/23 at 12:15 PM EDT by a video-enabled telemedicine application and verified that I am speaking with the correct person using two  identifiers.  Location: Patient: Virtual Visit Location Patient: Home Provider: Virtual Visit Location Provider: Home Office   I discussed the limitations of evaluation and management by telemedicine and the availability of in person appointments. The patient expressed understanding and agreed to proceed.    History of Present Illness: Brooke Le is a 57 y.o. who identifies as a female who was assigned female at birth, and is being seen today for Covid 32.  HPI: URI  This is a new problem. The current episode started in the past 7 days (Tested positive for Covid 19 on Monday with at home test; Symptoms started Saturday). The problem has been gradually improving. Maximum temperature: reports low grade fever. The fever has been present for 1 to 2 days. Associated symptoms include congestion, coughing, headaches and a sore throat. Pertinent negatives include no diarrhea, ear pain, nausea, plugged ear sensation, rhinorrhea, sinus pain, vomiting or wheezing. Associated symptoms comments: Chills, myalgias. Treatments tried: cough medication, dayquil, nyquil, cough drops, increased fluids. The treatment provided mild relief.      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 (Take nirmatrelvir 150 mg one tablet twice daily for 5 days and ritonavir 100 mg one tablet twice daily for 5 days) Patient GFR is 59, Disp: 20 tablet, Rfl: 0   promethazine-dextromethorphan (PROMETHAZINE-DM) 6.25-15 MG/5ML syrup, Take 5 mLs by mouth 4 (four) times daily as needed., Disp: 118 mL, Rfl: 0   amitriptyline (ELAVIL) 10 MG tablet, TAKE 2 TABLETS BY MOUTH AT BEDTIME, Disp: 180 tablet, Rfl: 0   amLODipine-valsartan (EXFORGE) 5-160 MG tablet, TAKE 1 TABLET BY MOUTH EVERY DAY, Disp: 90 tablet, Rfl: 0   ascorbic acid (VITAMIN C) 500 MG tablet, TAKE 1 TABLET (500 MG TOTAL) BY MOUTH DAILY., Disp: 90 tablet, Rfl: 1   diclofenac Sodium (VOLTAREN) 1 % GEL, Apply topically., Disp: , Rfl:    estradiol (CLIMARA - DOSED IN MG/24 HR) 0.05 mg/24hr patch, Apply 1 patch every week by transdermal route for 28 days., Disp: , Rfl:    fluticasone (FLONASE) 50 MCG/ACT nasal spray, Place 2 sprays into both nostrils daily., Disp: 16 g, Rfl: 0   hydrochlorothiazide (HYDRODIURIL) 25 MG tablet, TAKE 1 TABLET (25 MG TOTAL) BY MOUTH DAILY., Disp: 90 tablet, Rfl: 0   meclizine (ANTIVERT) 25 MG tablet, Take 1 tablet (25 mg total) by mouth 3 (three) times daily as needed for dizziness., Disp: 30 tablet, Rfl: 0   ondansetron (ZOFRAN) 4 MG tablet, Take 1 tablet (4 mg total) by mouth every 8 (eight) hours as needed for nausea or vomiting., Disp: 20 tablet, Rfl: 0   predniSONE (STERAPRED UNI-PAK 21 TAB) 10 MG (21) TBPK tablet, TAKE AS DIRECTED, Disp: 21 each, Rfl: 0   Semaglutide-Weight Management 0.25 MG/0.5ML SOAJ, Inject 0.25 mg into the skin once a week., Disp: 2 mL, Rfl: 1   Vitamin D, Ergocalciferol, (DRISDOL) 1.25 MG (50000 UNIT) CAPS capsule, Take 1 capsule (50,000 Units total) by mouth every 7 (seven) days., Disp: 12 capsule, Rfl: 1  Observations/Objective: Patient is well-developed, well-nourished in no acute distress.  Resting comfortably  at  home.  Head is normocephalic, atraumatic.  No labored breathing.  Speech is clear and coherent with logical content.  Patient is alert and oriented at baseline.    Assessment and Plan: 1. COVID-19 - nirmatrelvir/ritonavir, renal dosing, (PAXLOVID) 10 x 150 MG & 10 x 100MG  TABS; Take 2 tablets by mouth 2 (two) times daily for 5 days. (Take nirmatrelvir 150 mg one tablet twice daily for 5 days and ritonavir 100 mg one tablet twice daily for 5 days) Patient GFR is 59  Dispense: 20 tablet; Refill: 0 - promethazine-dextromethorphan (PROMETHAZINE-DM) 6.25-15 MG/5ML syrup; Take 5 mLs by mouth 4 (four) times daily as needed.  Dispense: 118 mL; Refill: 0  - Continue OTC symptomatic management of choice - Will send OTC vitamins and supplement information through AV[facility] - Paxlovid and Promethazine DM cough syrup prescribed - Patient enrolled in MyChart symptom monitoring - Push fluids - Rest as needed - Discussed return precautions and when to seek in-person evaluation, sent via AVS as well   Follow Up Instructions: I discussed the assessment and treatment plan with the patient. The patient was provided an opportunity to ask questions and all were answered. The patient  agreed with the plan and demonstrated an understanding of the instructions.  A copy of instructions were sent to the patient via MyChart unless otherwise noted below.    The patient was advised to call back or seek an in-person evaluation if the symptoms worsen or if the condition fails to improve as anticipated.  Time:  I spent 12 minutes with the patient via telehealth technology discussing the above problems/concerns.    Brooke Loveless, PA-C

## 2023-05-11 NOTE — Patient Instructions (Signed)
[name] Coral Le, thank you for joining [name], PA-C for today's virtual visit.  While this provider is not your primary care provider (PCP), if your PCP is located in our provider database this encounter information will be shared with them immediately following your visit.   A [facility] MyChart account gives you access to today's visit and all your visits, tests, and labs performed at [facility] " click here if you don't have a [facility] MyChart account or go to [email or web address]  Consent: (Patient) Brooke Le provided verbal consent for this virtual visit at the beginning of the encounter.  Current Medications:  Current Outpatient Medications:    nirmatrelvir/ritonavir, renal dosing, (PAXLOVID) 10 x 150 MG & 10 x 100MG  TABS, Take 2 tablets by mouth 2 (two) times daily for 5 days. (Take nirmatrelvir 150 mg one tablet twice daily for 5 days and ritonavir 100 mg one tablet twice daily for 5 days) Patient GFR is 59, Disp: 20 tablet, Rfl: 0   promethazine-dextromethorphan (PROMETHAZINE-DM) 6.25-15 MG/5ML syrup, Take 5 mLs by mouth 4 (four) times daily as needed., Disp: 118 mL, Rfl: 0   amitriptyline (ELAVIL) 10 MG tablet, TAKE 2 TABLETS BY MOUTH AT BEDTIME, Disp: 180 tablet, Rfl: 0   amLODipine-valsartan (EXFORGE) 5-160 MG tablet, TAKE 1 TABLET BY MOUTH EVERY DAY, Disp: 90 tablet, Rfl: 0   ascorbic acid (VITAMIN C) 500 MG tablet, TAKE 1 TABLET (500 MG TOTAL) BY MOUTH DAILY., Disp: 90 tablet, Rfl: 1   diclofenac Sodium (VOLTAREN) 1 % GEL, Apply topically., Disp: , Rfl:    estradiol (CLIMARA - DOSED IN MG/24 HR) 0.05 mg/24hr patch, Apply 1 patch every week by transdermal route for 28 days., Disp: , Rfl:    fluticasone (FLONASE) 50 MCG/ACT nasal spray, Place 2 sprays into both nostrils daily., Disp: 16 g, Rfl: 0   hydrochlorothiazide (HYDRODIURIL) 25 MG tablet, TAKE 1 TABLET (25 MG TOTAL) BY MOUTH DAILY., Disp: 90 tablet, Rfl: 0   meclizine  (ANTIVERT) 25 MG tablet, Take 1 tablet (25 mg total) by mouth 3 (three) times daily as needed for dizziness., Disp: 30 tablet, Rfl: 0   ondansetron (ZOFRAN) 4 MG tablet, Take 1 tablet (4 mg total) by mouth every 8 (eight) hours as needed for nausea or vomiting., Disp: 20 tablet, Rfl: 0   predniSONE (STERAPRED UNI-PAK 21 TAB) 10 MG (21) TBPK tablet, TAKE AS DIRECTED, Disp: 21 each, Rfl: 0   Semaglutide-Weight Management 0.25 MG/0.5ML SOAJ, Inject 0.25 mg into the skin once a week., Disp: 2 mL, Rfl: 1   Vitamin D, Ergocalciferol, (DRISDOL) 1.25 MG (50000 UNIT) CAPS capsule, Take 1 capsule (50,000 Units total) by mouth every 7 (seven) days., Disp: 12 capsule, Rfl: 1   Medications ordered in this encounter:  Meds ordered this encounter  Medications   nirmatrelvir/ritonavir, renal dosing, (PAXLOVID) 10 x 150 MG & 10 x 100MG  TABS    Sig: Take 2 tablets by mouth 2 (two) times daily for 5 days. (Take nirmatrelvir 150 mg one tablet twice daily for 5 days and ritonavir 100 mg one tablet twice daily for 5 days) Patient GFR is 59    Dispense:  20 tablet    Refill:  0    Order Specific Question:   Supervising Provider    Answer:   Merrilee Jansky [[id number]]   promethazine-dextromethorphan (PROMETHAZINE-DM) 6.25-15 MG/5ML syrup    Sig: Take 5 mLs by mouth 4 (four) times daily as needed.    Dispense:  118 mL  Refill:  0    Order Specific Question:   Supervising Provider    Answer:   Merrilee Jansky [[id number]]     *If you need refills on other medications prior to your next appointment, please contact your pharmacy*  Follow-Up: Call back or seek an in-person evaluation if the symptoms worsen or if the condition fails to improve as anticipated.  [facility] [phone]  Care Instructions: Nirmatrelvir; Ritonavir Tablets What is this medication? NIRMATRELVIR; RITONAVIR (NIR ma TREL vir; ri TOE na veer) treats mild to moderate COVID-19. It may help people who are at high risk of  developing severe illness. It works by limiting the spread of the virus in your body. This medicine may be used for other purposes; ask your health care provider or pharmacist if you have questions. COMMON BRAND NAME(S): PAXLOVID What should I tell my care team before I take this medication? They need to know if you have any of these conditions: Any allergies Any serious illness Kidney disease Liver disease An unusual or allergic reaction to nirmatrelvir, ritonavir, other medications, foods, dyes, or preservatives Pregnant or trying to get pregnant Breast-feeding How should I use this medication? This product contains 2 different medications that are packaged together. For the standard dose, take 2 pink tablets of nirmatrelvir with 1 white tablet of ritonavir (3 tablets total) by mouth with water twice daily. Talk to your care team if you have kidney disease. You may need a different dose. Swallow the tablets whole. You can take it with or without food. If it upsets your stomach, take it with food. Take all of this medication unless your care team tells you to stop it early. Keep taking it even if you think you are better. Talk to your care team about the use of this medication in children. While it may be prescribed for children as young as 12 years for selected conditions, precautions do apply. Overdosage: If you think you have taken too much of this medicine contact a poison control center or emergency room at once. NOTE: This medicine is only for you. Do not share this medicine with others. What if I miss a dose? If you miss a dose, take it as soon as you can unless it is more than 8 hours late. If it is more than 8 hours late, skip the missed dose. Take the next dose at the normal time. Do not take extra or 2 doses at the same time to make up for the missed dose. What may interact with this medication? Do not take this medication with any of the following: Alfuzosin Certain medications for  anxiety or sleep, such as midazolam or triazolam Certain medications for cancer, such as apalutamide Certain medications for cholesterol, such as lovastatin or simvastatin Certain medications for irregular heartbeat, such as amiodarone, dronedarone, flecainide, propafenone, quinidine Certain medications for mental health conditions, such as lurasidone or pimozide Certain medications for seizures, such as carbamazepine, phenobarbital, phenytoin, primidone Colchicine Eletriptan Eplerenone Ergot alkaloids, such as dihydroergotamine, ergotamine, methylergonovine Finerenone Flibanserin Ivabradine Lomitapide Lumacaftor; ivacaftor Naloxegol Ranolazine Red Yeast Rice Rifampin Rifapentine Sildenafil Silodosin St. John's wort Tolvaptan Ubrogepant Voclosporin This medication may affect how other medications work, and other medications may affect the way this medication works. Talk with your care team about all of the medications you take. They may suggest changes to your treatment plan to lower the risk of side effects and to make sure your medications work as intended. This list may not  describe all possible interactions. Give your health care provider a list of all the medicines, herbs, non-prescription drugs, or dietary supplements you use. Also tell them if you smoke, drink alcohol, or use illegal drugs. Some items may interact with your medicine. What should I watch for while using this medication? Your condition will be monitored carefully while you are receiving this medication. Visit your care team for regular checkups. Tell your care team if your symptoms do not start to get better or if they get worse. If you have untreated HIV infection, this medication may lead to some HIV medications not working as well in the future. Estrogen and progestin hormones may not work as well while you are taking this medication. Your care team can help you find the contraceptive option that works for  you. What side effects may I notice from receiving this medication? Side effects that you should report to your care team as soon as possible: Allergic reactions--skin rash, itching, hives, swelling of the face, lips, tongue, or throat Liver injury--right upper belly pain, loss of appetite, nausea, light-colored stool, dark yellow or brown urine, yellowing skin or eyes, unusual weakness or fatigue Redness, blistering, peeling, or loosening of the skin, including inside the mouth Side effects that usually do not require medical attention (report these to your care team if they continue or are bothersome): Change in taste Diarrhea General discomfort and fatigue Increase in blood pressure Muscle pain Nausea Stomach pain This list may not describe all possible side effects. Call your doctor for medical advice about side effects. You may report side effects to FDA at 1-800-FDA-1088. Where should I keep my medication? Keep out of the reach of children and pets. Store at room temperature between 20 and 25 degrees C (68 and 77 degrees F). Get rid of any unused medication after the expiration date. To get rid of medications that are no longer needed or have expired: Take the medication to a medication take-back program. Check with your pharmacy or law enforcement to find a location. If you cannot return the medication, check the label or package insert to see if the medication should be thrown out in the garbage or flushed down the toilet. If you are not sure, ask your care team. If it is safe to put it in the trash, take the medication out of the container. Mix the medication with cat litter, dirt, coffee grounds, or other unwanted substance. Seal the mixture in a bag or container. Put it in the trash. NOTE: This sheet is a summary. It may not cover all possible information. If you have questions about this medicine, talk to your doctor, pharmacist, or health care provider.  2024 Elsevier/Gold  Standard (2023-02-06 00:00:00)    Isolation Instructions: You are to isolate at home until you have been fever free for at least 24 hours without a fever-reducing medication, and symptoms have been steadily improving for 24 hours. At that time,  you can end isolation but need to mask for an additional 5 days.   If you must be around other household members who do not have symptoms, you need to make sure that both you and the family members are masking consistently with a high-quality mask.  If you note any worsening of symptoms despite treatment, please seek an in-person evaluation ASAP. If you note any significant shortness of breath or any chest pain, please seek ER evaluation. Please do not delay care!   COVID-19: What to Do if You Are Sick If  you test positive and are an older adult or someone who is at high risk of getting very sick from COVID-19, treatment may be available. Contact a healthcare provider right away after a positive test to determine if you are eligible, even if your symptoms are mild right now. You can also visit a Test to Treat location and, if eligible, receive a prescription from a provider. Don't delay: Treatment must be started within the first few days to be effective. If you have a fever, cough, or other symptoms, you might have COVID-19. Most people have mild illness and are able to recover at home. If you are sick: Keep track of your symptoms. If you have an emergency warning sign (including trouble breathing), call 911. Steps to help prevent the spread of COVID-19 if you are sick If you are sick with COVID-19 or think you might have COVID-19, follow the steps below to care for yourself and to help protect other people in your home and community. Stay home except to get medical care Stay home. Most people with COVID-19 have mild illness and can recover at home without medical care. Do not leave your home, except to get medical care. Do not visit public areas and do  not go to places where you are unable to wear a mask. Take care of yourself. Get rest and stay hydrated. Take over-the-counter medicines, such as acetaminophen, to help you feel better. Stay in touch with your doctor. Call before you get medical care. Be sure to get care if you have trouble breathing, or have any other emergency warning signs, or if you think it is an emergency. Avoid public transportation, ride-sharing, or taxis if possible. Get tested If you have symptoms of COVID-19, get tested. While waiting for test results, stay away from others, including staying apart from those living in your household. Get tested as soon as possible after your symptoms start. Treatments may be available for people with COVID-19 who are at risk for becoming very sick. Don't delay: Treatment must be started early to be effective--some treatments must begin within 5 days of your first symptoms. Contact your healthcare provider right away if your test result is positive to determine if you are eligible. Self-tests are one of several options for testing for the virus that causes COVID-19 and may be more convenient than laboratory-based tests and point-of-care tests. Ask your healthcare provider or your local health department if you need help interpreting your test results. You can visit your state, tribal, local, and territorial health department's website to look for the latest local information on testing sites. Separate yourself from other people As much as possible, stay in a specific room and away from other people and pets in your home. If possible, you should use a separate bathroom. If you need to be around other people or animals in or outside of the home, wear a well-fitting mask. Tell your close contacts that they may have been exposed to COVID-19. An infected person can spread COVID-19 starting 48 hours (or 2 days) before the person has any symptoms or tests positive. By letting your close contacts know  they may have been exposed to COVID-19, you are helping to protect everyone. See COVID-19 and Animals if you have questions about pets. If you are diagnosed with COVID-19, someone from the health department may call you. Answer the call to slow the spread. Monitor your symptoms Symptoms of COVID-19 include fever, cough, or other symptoms. Follow care instructions from your healthcare provider  and local health department. Your local health authorities may give instructions on checking your symptoms and reporting information. When to seek emergency medical attention Look for emergency warning signs* for COVID-19. If someone is showing any of these signs, seek emergency medical care immediately: Trouble breathing Persistent pain or pressure in the chest New confusion Inability to wake or stay awake Pale, gray, or blue-colored skin, lips, or nail beds, depending on skin tone *This list is not all possible symptoms. Please call your medical provider for any other symptoms that are severe or concerning to you. Call 911 or call ahead to your local emergency facility: Notify the operator that you are seeking care for someone who has or may have COVID-19. Call ahead before visiting your doctor Call ahead. Many medical visits for routine care are being postponed or done by phone or telemedicine. If you have a medical appointment that cannot be postponed, call your doctor's office, and tell them you have or may have COVID-19. This will help the office protect themselves and other patients. If you are sick, wear a well-fitting mask You should wear a mask if you must be around other people or animals, including pets (even at home). Wear a mask with the best fit, protection, and comfort for you. You don't need to wear the mask if you are alone. If you can't put on a mask (because of trouble breathing, for example), cover your coughs and sneezes in some other way. Try to stay at least 6 feet away from other  people. This will help protect the people around you. Masks should not be placed on young children under age 61 years, anyone who has trouble breathing, or anyone who is not able to remove the mask without help. Cover your coughs and sneezes Cover your mouth and nose with a tissue when you cough or sneeze. Throw away used tissues in a lined trash can. Immediately wash your hands with soap and water for at least 20 seconds. If soap and water are not available, clean your hands with an alcohol-based hand sanitizer that contains at least 60% alcohol. Clean your hands often Wash your hands often with soap and water for at least 20 seconds. This is especially important after blowing your nose, coughing, or sneezing; going to the bathroom; and before eating or preparing food. Use hand sanitizer if soap and water are not available. Use an alcohol-based hand sanitizer with at least 60% alcohol, covering all surfaces of your hands and rubbing them together until they feel dry. Soap and water are the best option, especially if hands are visibly dirty. Avoid touching your eyes, nose, and mouth with unwashed hands. Handwashing Tips Avoid sharing personal household items Do not share dishes, drinking glasses, cups, eating utensils, towels, or bedding with other people in your home. Wash these items thoroughly after using them with soap and water or put in the dishwasher. Clean surfaces in your home regularly Clean and disinfect high-touch surfaces (for example, doorknobs, tables, handles, light switches, and countertops) in your "sick room" and bathroom. In shared spaces, you should clean and disinfect surfaces and items after each use by the person who is ill. If you are sick and cannot clean, a caregiver or other person should only clean and disinfect the area around you (such as your bedroom and bathroom) on an as needed basis. Your caregiver/other person should wait as long as possible (at least several  hours) and wear a mask before entering, cleaning, and disinfecting shared spaces  that you use. Clean and disinfect areas that may have blood, stool, or body fluids on them. Use household cleaners and disinfectants. Clean visible dirty surfaces with household cleaners containing soap or detergent. Then, use a household disinfectant. Use a product from [employer] List N: Disinfectants for Coronavirus (COVID-19). Be sure to follow the instructions on the label to ensure safe and effective use of the product. Many products recommend keeping the surface wet with a disinfectant for a certain period of time (look at "contact time" on the product label). You may also need to wear personal protective equipment, such as gloves, depending on the directions on the product label. Immediately after disinfecting, wash your hands with soap and water for 20 seconds. For completed guidance on cleaning and disinfecting your home, visit Complete Disinfection Guidance. Take steps to improve ventilation at home Improve ventilation (air flow) at home to help prevent from spreading COVID-19 to other people in your household. Clear out COVID-19 virus particles in the air by opening windows, using air filters, and turning on fans in your home. Use this interactive tool to learn how to improve air flow in your home. When you can be around others after being sick with COVID-19 Deciding when you can be around others is different for different situations. Find out when you can safely end home isolation. For any additional questions about your care, contact your healthcare provider or state or local health department. 03/03/2021 Content source: [facility] ([facility]), Division of Viral Diseases This information is not intended to replace advice given to you by your health care provider. Make sure you discuss any questions you have with your health care provider. Document Revised: 04/16/2021  Document Reviewed: 04/16/2021 Elsevier Patient Education  2022 [employer].     If you have been instructed to have an in-person evaluation today at a local Urgent Care facility, please use the link below. It will take you to a list of all of our available [facility], including address, phone number and hours of operation. Please do not delay care.  [facility]  If you or a family member do not have a primary care provider, use the link below to schedule a visit and establish care. When you choose a [facility] primary care physician or advanced practice provider, you gain a long-term partner in health. Find a Primary Care Provider  Learn more about [facility]'s in-office and virtual care options: [facility] - Get Care Now

## 2023-08-05 ENCOUNTER — Other Ambulatory Visit: Payer: Self-pay | Admitting: Family

## 2023-09-06 NOTE — Progress Notes (Unsigned)
No chief complaint on file.   HPI: Brooke Le 57 y.o. come in for    Had covid 5 24  BP Weight   ROS: See pertinent positives and negatives per HPI.  Past Medical History:  Diagnosis Date   Allergic rhinitis    Allergy    Anemia, iron deficiency    Anxiety    Fibromyalgia    Gynecological examination    Dr. 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   Qualifier: Diagnosis of  By: Brooke Sharp MD, Brooke Le    Syncope    ER evaluation in 2008; had anemia then    Family History  Problem Relation Age of Onset   Prostate cancer Father    Esophageal cancer Maternal Aunt    Colon cancer Maternal Uncle    Colon cancer Cousin    Alcohol abuse Other    Diabetes Other    Hypertension Other    Colon cancer Other    Rectal cancer Neg Hx    Stomach cancer Neg Hx    Breast cancer Neg Hx     Social History   Socioeconomic History   Marital status: Legally Separated    Spouse name: Not on file   Number of children: Not on file   Years of education: Not on file   Highest education level: Some college, no degree  Occupational History   Not on file  Tobacco Use   Smoking status: Never   Smokeless tobacco: Never  Vaping Use   Vaping status: Never Used  Substance and Sexual Activity   Alcohol use: Yes    Comment: socially    Drug use: Never   Sexual activity: Not on file  Other Topics Concern   Not on file  Social History Narrative   Married   Regular Exercise- no   Laid off [employer]- July 2011 lost insurance in Jan.2012   Back to work at [employer] long shifts    Now am Public relations account executive   hh of 6 teens    No pets             Social Determinants of Health   Financial Resource Strain: Low Risk   (12/02/2021)   Overall Financial Resource Strain (CARDIA)    Difficulty of Paying Living Expenses: Not very hard  Food Insecurity: No Food Insecurity (12/02/2021)   Hunger Vital Sign    Worried About Running Out of Food in the Last Year: Never true    Ran Out of Food in the Last Year: Never true  Transportation Needs: No Transportation Needs (12/02/2021)   PRAPARE - Administrator, Civil Service (Medical): No    Lack of Transportation (Non-Medical): No  Physical Activity: Inactive (12/02/2021)   Exercise Vital Sign    Days of Exercise per Week: 1 day    Minutes of Exercise per Session: 0 min  Stress: Stress Concern Present (12/02/2021)   [employer] of Occupational Health - Occupational Stress Questionnaire    Feeling of Stress : To some extent  Social Connections: Unknown (04/26/2022)   Received from [facility]   Social Network    Social Network:  Not on file    Outpatient Medications Prior to Visit  Medication Sig Dispense Refill   amitriptyline (ELAVIL) 10 MG tablet TAKE 2 TABLETS BY MOUTH AT BEDTIME 180 tablet 0   amLODipine-valsartan (EXFORGE) 5-160 MG tablet TAKE 1 TABLET BY MOUTH EVERY DAY 90 tablet 0   ascorbic acid (VITAMIN C) 500 MG tablet TAKE 1 TABLET (500 MG TOTAL) BY MOUTH DAILY. 90 tablet 1   diclofenac Sodium (VOLTAREN) 1 % GEL Apply topically.     estradiol (CLIMARA - DOSED IN MG/24 HR) 0.05 mg/24hr patch Apply 1 patch every week by transdermal route for 28 days.     fluticasone (FLONASE) 50 MCG/ACT nasal spray Place 2 sprays into both nostrils daily. 16 g 0   hydrochlorothiazide (HYDRODIURIL) 25 MG tablet TAKE 1 TABLET (25 MG TOTAL) BY MOUTH DAILY. 90 tablet 0   meclizine (ANTIVERT) 25 MG tablet Take 1 tablet (25 mg total) by mouth 3 (three) times daily as needed for dizziness. 30 tablet 0   ondansetron (ZOFRAN) 4 MG tablet Take 1 tablet (4 mg total) by mouth every 8 (eight) hours as needed for nausea or vomiting. 20 tablet 0   predniSONE  (STERAPRED UNI-PAK 21 TAB) 10 MG (21) TBPK tablet TAKE AS DIRECTED 21 each 0   promethazine-dextromethorphan (PROMETHAZINE-DM) 6.25-15 MG/5ML syrup Take 5 mLs by mouth 4 (four) times daily as needed. 118 mL 0   Semaglutide-Weight Management 0.25 MG/0.5ML SOAJ Inject 0.25 mg into the skin once a week. 2 mL 1   Vitamin D, Ergocalciferol, (DRISDOL) 1.25 MG (50000 UNIT) CAPS capsule Take 1 capsule (50,000 Units total) by mouth every 7 (seven) days. 12 capsule 1   No facility-administered medications prior to visit.     EXAM:  LMP 10/04/2018 (Approximate)   There is no height or weight on file to calculate BMI.  GENERAL: vitals reviewed and listed above, alert, oriented, appears well hydrated and in no acute distress HEENT: atraumatic, conjunctiva  clear, no obvious abnormalities on inspection of external nose and ears OP : no lesion edema or exudate  NECK: no obvious masses on inspection palpation  LUNGS: clear to auscultation bilaterally, no wheezes, rales or rhonchi, good air movement CV: HRRR, no clubbing cyanosis or  peripheral edema nl cap refill  MS: moves all extremities without noticeable focal  abnormality PSYCH: pleasant and cooperative, no obvious depression or anxiety Lab Results  Component Value Date   WBC 4.5 04/14/2022   HGB 12.4 04/14/2022   HCT 37.4 04/14/2022   PLT 229.0 04/14/2022   GLUCOSE 92 08/23/2022   CHOL 177 04/14/2022   TRIG 59.0 04/14/2022   HDL 52.40 04/14/2022   LDLCALC 113 (H) 04/14/2022   ALT 14 04/14/2022   AST 17 04/14/2022   NA 140 08/23/2022   K 4.2 08/23/2022   CL 107 08/23/2022   CREATININE 1.05 08/23/2022   BUN 16 08/23/2022   CO2 27 08/23/2022   TSH 0.94 04/14/2022   HGBA1C 5.6 04/14/2022   BP Readings from Last 3 Encounters:  04/22/23 122/82  12/22/22 (!) 149/91  08/23/22 118/80    ASSESSMENT AND PLAN:  Discussed the following assessment and plan:  No diagnosis found.  -Patient advised to return or notify health care team   if  new concerns arise.  There are no Patient Instructions on file for this visit.   Brooke Le. Brooke Le M.D.

## 2023-09-07 ENCOUNTER — Encounter: Payer: Self-pay | Admitting: Internal Medicine

## 2023-09-07 ENCOUNTER — Other Ambulatory Visit: Payer: Self-pay | Admitting: Internal Medicine

## 2023-09-07 ENCOUNTER — Ambulatory Visit: Payer: BC Managed Care – PPO | Admitting: Internal Medicine

## 2023-09-07 VITALS — BP 104/66 | HR 113 | Temp 97.8°F | Ht 68.0 in | Wt 281.6 lb

## 2023-09-07 DIAGNOSIS — I10 Essential (primary) hypertension: Secondary | ICD-10-CM

## 2023-09-07 DIAGNOSIS — Z79899 Other long term (current) drug therapy: Secondary | ICD-10-CM | POA: Diagnosis not present

## 2023-09-07 DIAGNOSIS — J029 Acute pharyngitis, unspecified: Secondary | ICD-10-CM | POA: Diagnosis not present

## 2023-09-07 DIAGNOSIS — Z0000 Encounter for general adult medical examination without abnormal findings: Secondary | ICD-10-CM

## 2023-09-07 DIAGNOSIS — Z9884 Bariatric surgery status: Secondary | ICD-10-CM

## 2023-09-07 DIAGNOSIS — J040 Acute laryngitis: Secondary | ICD-10-CM | POA: Diagnosis not present

## 2023-09-07 DIAGNOSIS — R748 Abnormal levels of other serum enzymes: Secondary | ICD-10-CM

## 2023-09-07 DIAGNOSIS — E559 Vitamin D deficiency, unspecified: Secondary | ICD-10-CM

## 2023-09-07 DIAGNOSIS — D509 Iron deficiency anemia, unspecified: Secondary | ICD-10-CM

## 2023-09-07 LAB — POC COVID19 BINAXNOW: SARS Coronavirus 2 Ag: NEGATIVE

## 2023-09-07 LAB — POCT RAPID STREP A (OFFICE): Rapid Strep A Screen: NEGATIVE

## 2023-09-07 MED ORDER — VITAMIN D (ERGOCALCIFEROL) 1.25 MG (50000 UNIT) PO CAPS: 50000.0000 [IU] | ORAL_CAPSULE | ORAL | 1 refills | Status: DC

## 2023-09-07 MED ORDER — VITAMIN C 500 MG PO TABS: 500.0000 mg | ORAL_TABLET | Freq: Every day | ORAL | 1 refills | Status: DC

## 2023-09-07 MED ORDER — SEMAGLUTIDE-WEIGHT MANAGEMENT 0.25 MG/0.5ML ~~LOC~~ SOAJ: 0.2500 mg | SUBCUTANEOUS | 1 refills | Status: DC

## 2023-09-07 MED ORDER — B-12 1000 MCG SL SUBL: 1.0000 | SUBLINGUAL_TABLET | Freq: Every day | SUBLINGUAL | 3 refills | Status: DC

## 2023-09-07 NOTE — Patient Instructions (Signed)
I think the sx are result of combo resolving  uri  viral, allergy irritation and poss  reflux Try pepcid twice a day or prilosec of nexium per day for 2 weeks and then go from there.  Relative  voice rest .   Bp is ok today . High dose vit d for now and we can change to lower dose later.   Dissolvable b 12.   Will send in wegovy to your pharmacy but may not be covered

## 2023-09-23 ENCOUNTER — Telehealth: Payer: Self-pay

## 2023-09-23 ENCOUNTER — Other Ambulatory Visit (HOSPITAL_COMMUNITY): Payer: Self-pay

## 2023-09-23 NOTE — Telephone Encounter (Signed)
Pharmacy Patient Advocate Encounter   Received notification from CoverMyMeds that prior authorization for [facility] 0.25MG /0.5ML auto-injectors is required/requested.   Insurance verification completed.   The patient is insured through [facility] .   Per test claim: PA required; PA started via CoverMyMeds. KEY [id number] .   Error in CMM (could not submit chart notes). [employer], they are faxing over a PA form to be filled out and faxed back.  Fax: [phone] Ref #: [id number]

## 2023-09-26 NOTE — Telephone Encounter (Signed)
PA form completed and faxed back to [phone]

## 2023-09-27 ENCOUNTER — Other Ambulatory Visit (HOSPITAL_COMMUNITY): Payer: Self-pay

## 2023-09-27 NOTE — Telephone Encounter (Signed)
Pharmacy Patient Advocate Encounter  Received notification from [facility] that Prior Authorization for [facility] 0.25MG /0.5ML auto-injectors  has been APPROVED from 09/26/23 to 04/25/24. Ran test claim, Copay is $24.99. This test claim was processed through [facility]- copay amounts may vary at other pharmacies due to pharmacy/plan contracts, or as the patient moves through the different stages of their insurance plan.   PA #/Case ID/Reference #: [id number]

## 2023-10-11 LAB — HM MAMMOGRAPHY

## 2023-10-13 ENCOUNTER — Encounter: Payer: Self-pay | Admitting: Internal Medicine

## 2023-11-08 NOTE — Progress Notes (Unsigned)
No chief complaint on file.   HPI: Patient  Brooke Le  57 y.o. comes in today for Preventive Health Care visit  And Chronic disease management   Health Maintenance  Topic Date Due   Hepatitis C Screening  Never done   Zoster Vaccines- Shingrix (1 of 2) Never done   Cervical Cancer Screening (HPV/Pap Cotest)  09/08/2019   COVID-19 Vaccine (3 - Moderna risk series) 06/10/2020   INFLUENZA VACCINE  Never done   MAMMOGRAM  10/10/2024   DTaP/Tdap/Td (3 - Td or Tdap) 10/18/2028   Colonoscopy  02/13/2030   HIV Screening  Completed   HPV VACCINES  Aged Out   Health Maintenance Review LIFESTYLE:  Exercise:   Tobacco/ETS: Alcohol:  Sugar beverages: Sleep: Drug use: no HH of  Work:    ROS:  GEN/ HEENT: No fever, significant weight changes sweats headaches vision problems hearing changes, CV/ PULM; No chest pain shortness of breath cough, syncope,edema  change in exercise tolerance. GI /GU: No adominal pain, vomiting, change in bowel habits. No blood in the stool. No significant GU symptoms. SKIN/HEME: ,no acute skin rashes suspicious lesions or bleeding. No lymphadenopathy, nodules, masses.  NEURO/ PSYCH:  No neurologic signs such as weakness numbness. No depression anxiety. IMM/ Allergy: No unusual infections.  Allergy .   REST of 12 system review negative except as per HPI   Past Medical History:  Diagnosis Date   Allergic rhinitis    Allergy    Anemia, iron deficiency    Anxiety    Fibromyalgia    Gynecological examination    Dr. 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   Qualifier: Diagnosis of  By: Fabian Sharp MD, Neta Mends    Syncope    ER evaluation in 2008; had anemia then    Past Surgical History:  Procedure  Laterality Date   ADENOIDECTOMY     ESOPHAGOGASTRODUODENOSCOPY  10/31/2002   GASTRIC BYPASS     TUBAL LIGATION     UPPER GASTROINTESTINAL ENDOSCOPY      Family History  Problem Relation Age of Onset   Prostate cancer Father    Esophageal cancer Maternal Aunt    Colon cancer Maternal Uncle    Colon cancer Cousin    Alcohol abuse Other    Diabetes Other    Hypertension Other    Colon cancer Other    Rectal cancer Neg Hx    Stomach cancer Neg Hx    Breast cancer Neg Hx     Social History   Socioeconomic History   Marital status: Legally Separated    Spouse name: Not on file   Number of children: Not on file   Years of education: Not on file   Highest education level: Some college, no degree  Occupational History   Not on file  Tobacco Use   Smoking status: Never   Smokeless tobacco: Never  Vaping Use   Vaping status: Never Used  Substance and Sexual Activity   Alcohol use: Yes    Comment: socially    Drug use: Never   Sexual activity: Not on file  Other Topics Concern   Not on file  Social  History Narrative   Married   Regular Exercise- no   Laid off [employer]- July 2011 lost insurance in Jan.2012   Back to work at [employer] long shifts    Now am Public relations account executive   hh of 6 teens    No pets             Social Determinants of Health   Financial Resource Strain: Low Risk  (12/02/2021)   Overall Financial Resource Strain (CARDIA)    Difficulty of Paying Living Expenses: Not very hard  Food Insecurity: No Food Insecurity (12/02/2021)   Hunger Vital Sign    Worried About Running Out of Food in the Last Year: Never true    Ran Out of Food in the Last Year: Never true  Transportation Needs: No Transportation Needs (12/02/2021)   PRAPARE - Administrator, Civil Service (Medical): No    Lack of Transportation (Non-Medical): No  Physical Activity: Inactive (12/02/2021)   Exercise Vital Sign    Days of Exercise per Week: 1 day     Minutes of Exercise per Session: 0 min  Stress: Stress Concern Present (12/02/2021)   [employer] of Occupational Health - Occupational Stress Questionnaire    Feeling of Stress : To some extent  Social Connections: Unknown (04/26/2022)   Received from [facility]   Social Network    Social Network: Not on file    Outpatient Medications Prior to Visit  Medication Sig Dispense Refill   amitriptyline (ELAVIL) 10 MG tablet TAKE 2 TABLETS BY MOUTH AT BEDTIME 180 tablet 0   amLODipine-valsartan (EXFORGE) 5-160 MG tablet TAKE 1 TABLET BY MOUTH EVERY DAY 90 tablet 0   Cyanocobalamin (B-12) 1000 MCG SUBL Place 1 tablet under the tongue daily. 30 tablet 3   diclofenac Sodium (VOLTAREN) 1 % GEL Apply topically. (Patient not taking: Reported on 09/07/2023)     estradiol (CLIMARA - DOSED IN MG/24 HR) 0.05 mg/24hr patch Apply 1 patch every week by transdermal route for 28 days.     fluticasone (FLONASE) 50 MCG/ACT nasal spray Place 2 sprays into both nostrils daily. (Patient not taking: Reported on 09/07/2023) 16 g 0   hydrochlorothiazide (HYDRODIURIL) 25 MG tablet TAKE 1 TABLET (25 MG TOTAL) BY MOUTH DAILY. 90 tablet 0   promethazine-dextromethorphan (PROMETHAZINE-DM) 6.25-15 MG/5ML syrup Take 5 mLs by mouth 4 (four) times daily as needed. (Patient not taking: Reported on 09/07/2023) 118 mL 0   Semaglutide-Weight Management 0.25 MG/0.5ML SOAJ Inject 0.25 mg into the skin once a week. 2 mL 1   Vitamin D, Ergocalciferol, (DRISDOL) 1.25 MG (50000 UNIT) CAPS capsule Take 1 capsule (50,000 Units total) by mouth every 7 (seven) days. 12 capsule 1   No facility-administered medications prior to visit.     EXAM:  LMP 10/04/2018 (Approximate)   There is no height or weight on file to calculate BMI. Wt Readings from Last 3 Encounters:  09/07/23 281 lb 9.6 oz (127.7 kg)  04/22/23 261 lb (118.4 kg)  10/05/22 277 lb 12.5 oz (126 kg)    Physical Exam: Vital signs reviewed HQI:ONGE is  a well-developed well-nourished alert cooperative    who appearsr stated age in no acute distress.  HEENT: normocephalic atraumatic , Eyes: PERRL EOM's full, conjunctiva clear, Nares: paten,t no deformity discharge or tenderness., Ears: no deformity EAC's clear TMs with normal landmarks. Mouth: clear OP, no lesions, edema.  Moist mucous membranes. Dentition in adequate repair. NECK: supple without masses, thyromegaly or  bruits. CHEST/PULM:  Clear to auscultation and percussion breath sounds equal no wheeze , rales or rhonchi. No chest wall deformities or tenderness. Breast: normal by inspection . No dimpling, discharge, masses, tenderness or discharge . CV: PMI is nondisplaced, S1 S2 no gallops, murmurs, rubs. Peripheral pulses are full without delay.No JVD .  ABDOMEN: Bowel sounds normal nontender  No guard or rebound, no hepato splenomegal no CVA tenderness.  No hernia. Extremtities:  No clubbing cyanosis or edema, no acute joint swelling or redness no focal atrophy NEURO:  Oriented x3, cranial nerves 3-12 appear to be intact, no obvious focal weakness,gait within normal limits no abnormal reflexes or asymmetrical SKIN: No acute rashes normal turgor, color, no bruising or petechiae. PSYCH: Oriented, good eye contact, no obvious depression anxiety, cognition and judgment appear normal. LN: no cervical axillary inguinal adenopathy  Lab Results  Component Value Date   WBC 4.5 04/14/2022   HGB 12.4 04/14/2022   HCT 37.4 04/14/2022   PLT 229.0 04/14/2022   GLUCOSE 92 08/23/2022   CHOL 177 04/14/2022   TRIG 59.0 04/14/2022   HDL 52.40 04/14/2022   LDLCALC 113 (H) 04/14/2022   ALT 14 04/14/2022   AST 17 04/14/2022   NA 140 08/23/2022   K 4.2 08/23/2022   CL 107 08/23/2022   CREATININE 1.05 08/23/2022   BUN 16 08/23/2022   CO2 27 08/23/2022   TSH 0.94 04/14/2022   HGBA1C 5.6 04/14/2022    BP Readings from Last 3 Encounters:  09/07/23 104/66  04/22/23 122/82  12/22/22 (!) 149/91     Lab plan update  reviewed with patient   ASSESSMENT AND PLAN:  Discussed the following assessment and plan:    ICD-10-CM   1. Visit for preventive health examination  Z00.00     2. Essential hypertension  I10     3. Morbid obesity (HCC)  E66.01     4. Vitamin D deficiency  E55.9     5. Status post bariatric surgery  Z98.84     6. Medication management  Z79.899      No follow-ups on file.  Patient Care Team: Madelin Headings, MD as PCP - General There are no Patient Instructions on file for this visit.  Neta Mends. Livia Tarr M.D.

## 2023-11-09 ENCOUNTER — Ambulatory Visit (INDEPENDENT_AMBULATORY_CARE_PROVIDER_SITE_OTHER): Payer: BC Managed Care – PPO | Admitting: Internal Medicine

## 2023-11-09 VITALS — BP 100/68 | HR 96 | Temp 97.7°F | Ht 68.0 in | Wt 277.8 lb

## 2023-11-09 DIAGNOSIS — E559 Vitamin D deficiency, unspecified: Secondary | ICD-10-CM | POA: Diagnosis not present

## 2023-11-09 DIAGNOSIS — Z9884 Bariatric surgery status: Secondary | ICD-10-CM | POA: Diagnosis not present

## 2023-11-09 DIAGNOSIS — I10 Essential (primary) hypertension: Secondary | ICD-10-CM | POA: Diagnosis not present

## 2023-11-09 DIAGNOSIS — Z0000 Encounter for general adult medical examination without abnormal findings: Secondary | ICD-10-CM | POA: Diagnosis not present

## 2023-11-09 DIAGNOSIS — Z79899 Other long term (current) drug therapy: Secondary | ICD-10-CM

## 2023-11-09 MED ORDER — WEGOVY 0.5 MG/0.5ML ~~LOC~~ SOAJ: 0.5000 mg | SUBCUTANEOUS | 1 refills | Status: DC

## 2023-11-09 NOTE — Patient Instructions (Addendum)
Good to  see you today BP is good today.  Since only  taking hydrochlorothiazide 3 x per week  can try off this med for now.  Plan fasting  lab after a few weeks  EL[facility]  lab .  If bp is increasing we can add back  the hydrochlorothiazide.  Increase wegovy to 0.5  per week  call for refill  . Consider increasing dose depending.   Plan  APPT in 2 mos   about med  check can be virtual .

## 2024-01-10 ENCOUNTER — Other Ambulatory Visit: Payer: Self-pay | Admitting: Internal Medicine

## 2024-01-10 MED ORDER — AMLODIPINE BESYLATE-VALSARTAN 5-160 MG PO TABS: 1.0000 | ORAL_TABLET | Freq: Every day | ORAL | 0 refills | Status: DC

## 2024-01-11 ENCOUNTER — Other Ambulatory Visit: Payer: BC Managed Care – PPO

## 2024-01-11 ENCOUNTER — Other Ambulatory Visit (INDEPENDENT_AMBULATORY_CARE_PROVIDER_SITE_OTHER): Payer: BC Managed Care – PPO

## 2024-01-11 DIAGNOSIS — I10 Essential (primary) hypertension: Secondary | ICD-10-CM | POA: Diagnosis not present

## 2024-01-11 DIAGNOSIS — Z0000 Encounter for general adult medical examination without abnormal findings: Secondary | ICD-10-CM

## 2024-01-11 DIAGNOSIS — Z79899 Other long term (current) drug therapy: Secondary | ICD-10-CM

## 2024-01-11 DIAGNOSIS — E559 Vitamin D deficiency, unspecified: Secondary | ICD-10-CM

## 2024-01-11 DIAGNOSIS — Z9884 Bariatric surgery status: Secondary | ICD-10-CM

## 2024-01-11 LAB — COMPREHENSIVE METABOLIC PANEL
ALT: 17 U/L (ref 0–35)
AST: 22 U/L (ref 0–37)
Albumin: 4 g/dL (ref 3.5–5.2)
Alkaline Phosphatase: 138 U/L — ABNORMAL HIGH (ref 39–117)
BUN: 13 mg/dL (ref 6–23)
CO2: 28 meq/L (ref 19–32)
Calcium: 9.4 mg/dL (ref 8.4–10.5)
Chloride: 104 meq/L (ref 96–112)
Creatinine, Ser: 1.02 mg/dL (ref 0.40–1.20)
GFR: 60.92 mL/min (ref >60.00)
Glucose, Bld: 80 mg/dL (ref 70–99)
Potassium: 3.4 meq/L — ABNORMAL LOW (ref 3.5–5.1)
Sodium: 139 meq/L (ref 135–145)
Total Bilirubin: 1.1 mg/dL (ref 0.2–1.2)
Total Protein: 7 g/dL (ref 6.0–8.3)

## 2024-01-11 LAB — LIPID PANEL
Cholesterol: 163 mg/dL (ref 0–200)
HDL: 39.9 mg/dL (ref >39.00)
LDL Cholesterol: 110 mg/dL — ABNORMAL HIGH (ref 0–99)
NonHDL: 123.44
Total CHOL/HDL Ratio: 4
Triglycerides: 65 mg/dL (ref 0.0–149.0)
VLDL: 13 mg/dL (ref 0.0–40.0)

## 2024-01-11 LAB — CBC WITH DIFFERENTIAL/PLATELET
Basophils Absolute: 0 10*3/uL (ref 0.0–0.1)
Basophils Relative: 0.3 % (ref 0.0–3.0)
Eosinophils Absolute: 0.1 10*3/uL (ref 0.0–0.7)
Eosinophils Relative: 2 % (ref 0.0–5.0)
HCT: 37.8 % (ref 36.0–46.0)
Hemoglobin: 12.5 g/dL (ref 12.0–15.0)
Lymphocytes Relative: 52.7 % — ABNORMAL HIGH (ref 12.0–46.0)
Lymphs Abs: 2.5 10*3/uL (ref 0.7–4.0)
MCHC: 33.1 g/dL (ref 30.0–36.0)
MCV: 88.3 fL (ref 78.0–100.0)
Monocytes Absolute: 0.3 10*3/uL (ref 0.1–1.0)
Monocytes Relative: 6.8 % (ref 3.0–12.0)
Neutro Abs: 1.8 10*3/uL (ref 1.4–7.7)
Neutrophils Relative %: 38.2 % — ABNORMAL LOW (ref 43.0–77.0)
Platelets: 272 10*3/uL (ref 150.0–400.0)
RBC: 4.28 Mil/uL (ref 3.87–5.11)
RDW: 14.2 % (ref 11.5–15.5)
WBC: 4.7 10*3/uL (ref 4.0–10.5)

## 2024-01-11 LAB — TSH: TSH: 0.88 u[IU]/mL (ref 0.35–5.50)

## 2024-01-11 LAB — VITAMIN D 25 HYDROXY (VIT D DEFICIENCY, FRACTURES): VITD: 40.64 ng/mL (ref 30.00–100.00)

## 2024-01-11 LAB — HEMOGLOBIN A1C: Hgb A1c MFr Bld: 5.6 % (ref 4.6–6.5)

## 2024-01-12 LAB — HEPATITIS C ANTIBODY: Hepatitis C Ab: NONREACTIVE

## 2024-01-18 ENCOUNTER — Telehealth: Payer: BC Managed Care – PPO | Admitting: Internal Medicine

## 2024-01-18 ENCOUNTER — Encounter: Payer: Self-pay | Admitting: Internal Medicine

## 2024-01-18 ENCOUNTER — Other Ambulatory Visit: Payer: Self-pay | Admitting: Internal Medicine

## 2024-01-18 VITALS — Wt 257.0 lb

## 2024-01-18 DIAGNOSIS — E559 Vitamin D deficiency, unspecified: Secondary | ICD-10-CM | POA: Diagnosis not present

## 2024-01-18 DIAGNOSIS — I10 Essential (primary) hypertension: Secondary | ICD-10-CM

## 2024-01-18 DIAGNOSIS — Z79899 Other long term (current) drug therapy: Secondary | ICD-10-CM

## 2024-01-18 DIAGNOSIS — Z9884 Bariatric surgery status: Secondary | ICD-10-CM

## 2024-01-18 DIAGNOSIS — R748 Abnormal levels of other serum enzymes: Secondary | ICD-10-CM

## 2024-01-18 MED ORDER — SEMAGLUTIDE-WEIGHT MANAGEMENT 1 MG/0.5ML ~~LOC~~ SOAJ: 1.0000 mg | SUBCUTANEOUS | 2 refills | Status: DC

## 2024-01-18 MED ORDER — B-12 1000 MCG SL SUBL: 1.0000 | SUBLINGUAL_TABLET | Freq: Every day | SUBLINGUAL | 3 refills | Status: AC

## 2024-01-18 NOTE — Progress Notes (Signed)
 f Virtual Visit via Video Note  I connected with Brooke Le on 01/18/24 at  4:15 PM EST by a video enabled telemedicine application and verified that I am speaking with the correct person using two identifiers. Location patient: home Location provider:work office Persons participating in the virtual visit: patient, provider   Patient aware  of the limitations of evaluation and management by telemedicine and  availability of in person appointments. and agreed to proceed.   HPI: Brooke Le presents for video visit  fu labs and med check  Weight on wegovy  0.5 weekly and doing well had a few days of diarrhea but ok now  weight coming down  no change in energy. Asks for diet rereferral.  Needs refill of sl b12  . BP has been good  hasn't been taking hydrochlorothiazide  but ocass  and ok to try off.    ROS: See pertinent positives and negatives per HPI.  Past Medical History:  Diagnosis Date   Allergic rhinitis    Allergy    Anemia, iron deficiency    Anxiety    Fibromyalgia    Gynecological examination    Dr. 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   Qualifier: Diagnosis of  By: Charlett MD, Apolinar POUR    Syncope    ER evaluation in 2008; had anemia then    Past Surgical History:  Procedure Laterality Date   ADENOIDECTOMY     ESOPHAGOGASTRODUODENOSCOPY  10/31/2002   GASTRIC BYPASS     TUBAL LIGATION     UPPER GASTROINTESTINAL ENDOSCOPY      Family History  Problem Relation Age of Onset   Prostate cancer Father    Esophageal cancer Maternal Aunt    Colon cancer Maternal Uncle    Colon cancer Cousin    Alcohol abuse Other    Diabetes Other    Hypertension Other    Colon cancer Other    Rectal cancer Neg  Hx    Stomach cancer Neg Hx    Breast cancer Neg Hx     Social History   Tobacco Use   Smoking status: Never   Smokeless tobacco: Never  Vaping Use   Vaping status: Never Used  Substance Use Topics   Alcohol use: Yes    Comment: socially    Drug use: Never      Current Outpatient Medications:    amitriptyline  (ELAVIL ) 10 MG tablet, TAKE 2 TABLETS BY MOUTH AT BEDTIME, Disp: 180 tablet, Rfl: 0   amLODipine -valsartan  (EXFORGE ) 5-160 MG tablet, Take 1 tablet by mouth daily., Disp: 90 tablet, Rfl: 0   diclofenac Sodium (VOLTAREN) 1 % GEL, Apply topically., Disp: , Rfl:    fluticasone  (FLONASE ) 50 MCG/ACT nasal spray, Place 2 sprays into both nostrils daily., Disp: 16 g, Rfl: 0   hydrochlorothiazide  (HYDRODIURIL ) 25 MG tablet, TAKE 1 TABLET (25 MG TOTAL) BY MOUTH DAILY., Disp: 90 tablet, Rfl: 0   Semaglutide -Weight Management 1 MG/0.5ML SOAJ, Inject 1 mg into the skin once a week. Increase dosing from 0.5 weekly, Disp: 2 mL, Rfl: 2   Vitamin D , Ergocalciferol , (DRISDOL ) 1.25 MG (50000 UNIT) CAPS capsule, Take 1 capsule (50,000 Units total)  by mouth every 7 (seven) days., Disp: 12 capsule, Rfl: 1   Cyanocobalamin  (B-12) 1000 MCG SUBL, Place 1 tablet under the tongue daily., Disp: 30 tablet, Rfl: 3  EXAM: BP Readings from Last 3 Encounters:  11/09/23 100/68  09/07/23 104/66  04/22/23 122/82   Wt Readings from Last 3 Encounters:  01/18/24 257 lb (116.6 kg)  11/09/23 277 lb 12.8 oz (126 kg)  09/07/23 281 lb 9.6 oz (127.7 kg)     VITALS per patient if applicable:  GENERAL: alert, oriented, appears well and in no acute distress  HEENT: atraumatic, conjunttiva clear, no obvious abnormalities on inspection of external nose and ears  NECK: normal movements of the head and neck  LUNGS: on inspection no signs of respiratory distress, breathing rate appears normal, no obvious gross SOB, gasping or wheezing  CV: no obvious cyanosis  MS: moves all visible extremities without  noticeable abnormality  PSYCH/NEURO: pleasant and cooperative, no obvious depression or anxiety, speech and thought processing grossly intact Lab Results  Component Value Date   WBC 4.7 01/11/2024   HGB 12.5 01/11/2024   HCT 37.8 01/11/2024   PLT 272.0 01/11/2024   GLUCOSE 80 01/11/2024   CHOL 163 01/11/2024   TRIG 65.0 01/11/2024   HDL 39.90 01/11/2024   LDLCALC 110 (H) 01/11/2024   ALT 17 01/11/2024   AST 22 01/11/2024   NA 139 01/11/2024   K 3.4 (L) 01/11/2024   CL 104 01/11/2024   CREATININE 1.02 01/11/2024   BUN 13 01/11/2024   CO2 28 01/11/2024   TSH 0.88 01/11/2024   HGBA1C 5.6 01/11/2024    ASSESSMENT AND PLAN:  Discussed the following assessment and plan:    ICD-10-CM   1. Essential hypertension  I10     2. Morbid obesity (HCC)  E66.01 Amb ref to Medical Nutrition Therapy-MNT    3. Medication management  Z79.899     4. Vitamin D  deficiency  E55.9 Amb ref to Medical Nutrition Therapy-MNT    5. Status post bariatric surgery  Z98.84 Amb ref to Medical Nutrition Therapy-MNT    6. Elevated alkaline phosphatase level  R74.8      Potassium borderline low   stop the hydrochlorothiazide  all together  and inc potassium in diet ( not taking much anyway) Bp is doing better with weight loss .   Alk phos has been up and down  for a while remote neg ggt. Suspect bone and from  low vit d  better but still elevated suspect still a bone source   follow up stay on high dose vit d for now .   ? Effect of bariatric surgery? W absorption  Ok to inc wegovy  to 1 mg  no long term se  but add muscle exercises  paln  Will do diet referral as per pat request .  LIPIDS:  Hdl could be better also  Counseled.   Expectant management and discussion of plan and treatment with opportunity to ask questions and all were answered. The patient agreed with the plan and demonstrated an understanding of the instructions.   Advised to call back or seek an in-person evaluation if worsening  or  having  further concerns  in interim. Return for lab in 2 mos  ( in system)  rov in 3 months  or as indicated. SABRA Apolinar Eastern, MD

## 2024-01-18 NOTE — Progress Notes (Signed)
 Future orders   2 mos  potas alk phos vit d

## 2024-01-31 ENCOUNTER — Other Ambulatory Visit: Payer: Self-pay

## 2024-02-29 ENCOUNTER — Telehealth: Admitting: Physician Assistant

## 2024-02-29 DIAGNOSIS — J029 Acute pharyngitis, unspecified: Secondary | ICD-10-CM

## 2024-02-29 MED ORDER — LIDOCAINE VISCOUS HCL 2 % MT SOLN: 10.0000 mL | Freq: Four times a day (QID) | OROMUCOSAL | 0 refills | Status: DC | PRN

## 2024-02-29 NOTE — Progress Notes (Signed)
 Virtual Visit Consent   Brooke Le, you are scheduled for a virtual visit with a [facility] provider today. Just as with appointments in the office, your consent must be obtained to participate. Your consent will be active for this visit and any virtual visit you may have with one of our providers in the next 365 days. If you have a MyChart account, a copy of this consent can be sent to you electronically.  As this is a virtual visit, video technology does not allow for your provider to perform a traditional examination. This may limit your provider's ability to fully assess your condition. If your provider identifies any concerns that need to be evaluated in person or the need to arrange testing (such as labs, EKG, etc.), we will make arrangements to do so. Although advances in technology are sophisticated, we cannot ensure that it will always work on either your end or our end. If the connection with a video visit is poor, the visit may have to be switched to a telephone visit. With either a video or telephone visit, we are not always able to ensure that we have a secure connection.  By engaging in this virtual visit, you consent to the provision of healthcare and authorize for your insurance to be billed (if applicable) for the services provided during this visit. Depending on your insurance coverage, you may receive a charge related to this service.  I need to obtain your verbal consent now. Are you willing to proceed with your visit today? Brooke Le has provided verbal consent on 02/29/2024 for a virtual visit (video or telephone). Piedad Climes, [place]  Date: 02/29/2024 7:27 PM   Virtual Visit via Video Note   I, Piedad Climes, connected with  Brooke Le  ([id number], 04-27-1966) on 02/29/24 at  7:30 PM EDT by a video-enabled telemedicine application and verified that I am speaking with the correct person using two  identifiers.  Location: Patient: Virtual Visit Location Patient: Home Provider: Virtual Visit Location Provider: Home Office   I discussed the limitations of evaluation and management by telemedicine and the availability of in person appointments. The patient expressed understanding and agreed to proceed.    History of Present Illness: Brooke Le is a 58 y.o. who identifies as a female who was assigned female at birth, and is being seen today for 2 days of sore throat that was mild but now worsening in severity. Denies fever, chills. Some sweats. Notes some mild neck tenderness. Denies cough or congestion. Denies sinus pain or facial pain. Has been drinking lemon-tea. Has been using lozenges and OTC Tylenol.   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   Iron deficiency anemia 07/14/2007   Essential hypertension 07/14/2007    Allergies:  Allergies  Allergen Reactions   Bee Pollen Swelling   Medications:  Current Outpatient Medications:    lidocaine (XYLOCAINE) 2 % solution, Use as directed 10 mLs in the mouth or throat every 6 (six) hours as needed for  mouth pain., Disp: 100 mL, Rfl: 0   amitriptyline (ELAVIL) 10 MG tablet, TAKE 2 TABLETS BY MOUTH AT BEDTIME, Disp: 180 tablet, Rfl: 0   amLODipine-valsartan (EXFORGE) 5-160 MG tablet, Take 1 tablet by mouth daily., Disp: 90 tablet, Rfl: 0   Cyanocobalamin (B-12) 1000 MCG SUBL, Place 1 tablet under the tongue daily., Disp: 30 tablet, Rfl: 3    diclofenac Sodium (VOLTAREN) 1 % GEL, Apply topically., Disp: , Rfl:    fluticasone (FLONASE) 50 MCG/ACT nasal spray, Place 2 sprays into both nostrils daily., Disp: 16 g, Rfl: 0   hydrochlorothiazide (HYDRODIURIL) 25 MG tablet, TAKE 1 TABLET (25 MG TOTAL) BY MOUTH DAILY., Disp: 90 tablet, Rfl: 0   Semaglutide-Weight Management 1 MG/0.5ML SOAJ, Inject 1 mg into the skin once a week. Increase dosing from 0.5 weekly, Disp: 2 mL, Rfl: 2   Vitamin D, Ergocalciferol, (DRISDOL) 1.25 MG (50000 UNIT) CAPS capsule, Take 1 capsule (50,000 Units total) by mouth every 7 (seven) days., Disp: 12 capsule, Rfl: 1  Observations/Objective: Patient is well-developed, well nourished in no acute distress.  Resting comfortably at home.  Head is normocephalic, atraumatic.  No labored breathing. Speech is clear and coherent with logical content.  Patient is alert and oriented at baseline.   Assessment and Plan: 1. Acute viral pharyngitis (Primary) - lidocaine (XYLOCAINE) 2 % solution; Use as directed 10 mLs in the mouth or throat every 6 (six) hours as needed for mouth pain.  Dispense: 100 mL; Refill: 0  Supportive measures and OTC medications reviewed. Add on OTC antihistamine. Humidifier in bedroom. Viscous lidocaine sent to pharmacy.   Follow Up Instructions: I discussed the assessment and treatment plan with the patient. The patient was provided an opportunity to ask questions and all were answered. The patient agreed with the plan and demonstrated an understanding of the instructions.  A copy of instructions were sent to the patient via MyChart unless otherwise noted below.   The patient was advised to call back or seek an in-person evaluation if the symptoms worsen or if the condition fails to improve as anticipated.    Piedad Climes, PA-C

## 2024-02-29 NOTE — Patient Instructions (Addendum)
 Brooke Le, thank you for joining Piedad Climes, PA-C for today's virtual visit.  While this provider is not your primary care provider (PCP), if your PCP is located in our provider database this encounter information will be shared with them immediately following your visit.   A [facility] MyChart account gives you access to today's visit and all your visits, tests, and labs performed at [facility] " click here if you don't have a [facility] MyChart account or go to [email or web address]  Consent: (Patient) Brooke Le provided verbal consent for this virtual visit at the beginning of the encounter.  Current Medications:  Current Outpatient Medications:    amitriptyline (ELAVIL) 10 MG tablet, TAKE 2 TABLETS BY MOUTH AT BEDTIME, Disp: 180 tablet, Rfl: 0   amLODipine-valsartan (EXFORGE) 5-160 MG tablet, Take 1 tablet by mouth daily., Disp: 90 tablet, Rfl: 0   Cyanocobalamin (B-12) 1000 MCG SUBL, Place 1 tablet under the tongue daily., Disp: 30 tablet, Rfl: 3   diclofenac Sodium (VOLTAREN) 1 % GEL, Apply topically., Disp: , Rfl:    fluticasone (FLONASE) 50 MCG/ACT nasal spray, Place 2 sprays into both nostrils daily., Disp: 16 g, Rfl: 0   hydrochlorothiazide (HYDRODIURIL) 25 MG tablet, TAKE 1 TABLET (25 MG TOTAL) BY MOUTH DAILY., Disp: 90 tablet, Rfl: 0   Semaglutide-Weight Management 1 MG/0.5ML SOAJ, Inject 1 mg into the skin once a week. Increase dosing from 0.5 weekly, Disp: 2 mL, Rfl: 2   Vitamin D, Ergocalciferol, (DRISDOL) 1.25 MG (50000 UNIT) CAPS capsule, Take 1 capsule (50,000 Units total) by mouth every 7 (seven) days., Disp: 12 capsule, Rfl: 1   Medications ordered in this encounter:  No orders of the defined types were placed in this encounter.    *If you need refills on other medications prior to your next appointment, please contact your pharmacy*  Follow-Up: Call back or seek an in-person evaluation if the symptoms worsen or  if the condition fails to improve as anticipated.  [facility] [phone]  Other Instructions  E-Visit for Sore Throat  We are sorry that you are not feeling well.  Here is how we plan to help!  Your symptoms indicate a likely viral infection (Pharyngitis).   Pharyngitis is inflammation in the back of the throat which can cause a sore throat, scratchiness and sometimes difficulty swallowing.   Pharyngitis is typically caused by a respiratory virus and will just run its course.  Please keep in mind that your symptoms could last up to 10 days.  For throat pain, we recommend over the counter oral pain relief medications such as acetaminophen or aspirin, or anti-inflammatory medications such as ibuprofen or naproxen sodium.  Topical treatments such as oral throat lozenges or sprays may be used as needed.  Avoid close contact with loved ones, especially the very young and elderly.  Remember to wash your hands thoroughly throughout the day as this is the number one way to prevent the spread of infection and wipe down door knobs and counters with disinfectant.  After careful review of your answers, I would not recommend an antibiotic for your condition.  Antibiotics should not be used to treat conditions that we suspect are caused by viruses like the virus that causes the common cold or flu. However, some people can have Strep with atypical symptoms. You may need formal testing in clinic or office to confirm if your symptoms continue or worsen.  Providers prescribe antibiotics to treat infections caused by bacteria.  Antibiotics are very powerful in treating bacterial infections when they are used properly.  To maintain their effectiveness, they should be used only when necessary.  Overuse of antibiotics has resulted in the development of super bugs that are resistant to treatment!    Home Care: Only take medications as instructed by your medical team. Do not drink alcohol while taking  these medications. A steam or ultrasonic humidifier can help congestion.  You can place a towel over your head and breathe in the steam from hot water coming from a faucet. Avoid close contacts especially the very young and the elderly. Cover your mouth when you cough or sneeze. Always remember to wash your hands.  Get Help Right Away If: You develop worsening fever or throat pain. You develop a severe head ache or visual changes. Your symptoms persist after you have completed your treatment plan.  Make sure you Understand these instructions. Will watch your condition. Will get help right away if you are not doing well or get worse.   Thank you for choosing an e-visit.  Your e-visit answers were reviewed by a board certified advanced clinical practitioner to complete your personal care plan. Depending upon the condition, your plan could have included both over the counter or prescription medications.  Please review your pharmacy choice. Make sure the pharmacy is open so you can pick up prescription now. If there is a problem, you may contact your provider through [employer] and have the prescription routed to another pharmacy.  Your safety is important to [place]. If you have drug allergies check your prescription carefully.   For the next 24 hours you can use MyChart to ask questions about today's visit, request a non-urgent call back, or ask for a work or school excuse. You will get an email in the next two days asking about your experience. I hope that your e-visit has been valuable and will speed your recovery.  If you have been instructed to have an in-person evaluation today at a local Urgent Care facility, please use the link below. It will take you to a list of all of our available [facility], including address, phone number and hours of operation. Please do not delay care.  [facility]  If you or a family member do not have a primary care provider,  use the link below to schedule a visit and establish care. When you choose a [facility] primary care physician or advanced practice provider, you gain a long-term partner in health. Find a Primary Care Provider  Learn more about [facility]'s in-office and virtual care options: [facility] - Get Care Now

## 2024-03-07 ENCOUNTER — Ambulatory Visit: Payer: BC Managed Care – PPO | Admitting: Dietician

## 2024-03-07 ENCOUNTER — Telehealth: Admitting: Physician Assistant

## 2024-03-07 DIAGNOSIS — B9689 Other specified bacterial agents as the cause of diseases classified elsewhere: Secondary | ICD-10-CM

## 2024-03-07 DIAGNOSIS — J069 Acute upper respiratory infection, unspecified: Secondary | ICD-10-CM

## 2024-03-07 MED ORDER — AZITHROMYCIN 250 MG PO TABS: ORAL_TABLET | ORAL | 0 refills | Status: AC

## 2024-03-07 MED ORDER — ALBUTEROL SULFATE HFA 108 (90 BASE) MCG/ACT IN AERS: 1.0000 | INHALATION_SPRAY | Freq: Four times a day (QID) | RESPIRATORY_TRACT | 0 refills | Status: DC | PRN

## 2024-03-07 MED ORDER — PROMETHAZINE-DM 6.25-15 MG/5ML PO SYRP: 5.0000 mL | ORAL_SOLUTION | Freq: Four times a day (QID) | ORAL | 0 refills | Status: DC | PRN

## 2024-03-07 NOTE — Patient Instructions (Signed)
 Brooke Le, thank you for joining [name], PA-C for today's virtual visit.  While this provider is not your primary care provider (PCP), if your PCP is located in our provider database this encounter information will be shared with them immediately following your visit.   A [facility] MyChart account gives you access to today's visit and all your visits, tests, and labs performed at [facility] " click here if you don't have a [name] MyChart account or go to [email or web address]  Consent: (Patient) Brooke Le provided verbal consent for this virtual visit at the beginning of the encounter.  Current Medications:  Current Outpatient Medications:    albuterol (VENTOLIN HFA) 108 (90 Base) MCG/ACT inhaler, Inhale 1-2 puffs into the lungs every 6 (six) hours as needed., Disp: 8 g, Rfl: 0   azithromycin (ZITHROMAX) 250 MG tablet, Take 2 tablets on day 1, then 1 tablet daily on days 2 through 5, Disp: 6 tablet, Rfl: 0   promethazine-dextromethorphan (PROMETHAZINE-DM) 6.25-15 MG/5ML syrup, Take 5 mLs by mouth 4 (four) times daily as needed., Disp: 118 mL, Rfl: 0   amitriptyline (ELAVIL) 10 MG tablet, TAKE 2 TABLETS BY MOUTH AT BEDTIME, Disp: 180 tablet, Rfl: 0   amLODipine-valsartan (EXFORGE) 5-160 MG tablet, Take 1 tablet by mouth daily., Disp: 90 tablet, Rfl: 0   Cyanocobalamin (B-12) 1000 MCG SUBL, Place 1 tablet under the tongue daily., Disp: 30 tablet, Rfl: 3   diclofenac Sodium (VOLTAREN) 1 % GEL, Apply topically., Disp: , Rfl:    fluticasone (FLONASE) 50 MCG/ACT nasal spray, Place 2 sprays into both nostrils daily., Disp: 16 g, Rfl: 0   hydrochlorothiazide (HYDRODIURIL) 25 MG tablet, TAKE 1 TABLET (25 MG TOTAL) BY MOUTH DAILY., Disp: 90 tablet, Rfl: 0   lidocaine (XYLOCAINE) 2 % solution, Use as directed 10 mLs in the mouth or throat every 6 (six) hours as needed for mouth pain., Disp: 100 mL, Rfl: 0   Semaglutide-Weight Management 1  MG/0.5ML SOAJ, Inject 1 mg into the skin once a week. Increase dosing from 0.5 weekly, Disp: 2 mL, Rfl: 2   Vitamin D, Ergocalciferol, (DRISDOL) 1.25 MG (50000 UNIT) CAPS capsule, Take 1 capsule (50,000 Units total) by mouth every 7 (seven) days., Disp: 12 capsule, Rfl: 1   Medications ordered in this encounter:  Meds ordered this encounter  Medications   azithromycin (ZITHROMAX) 250 MG tablet    Sig: Take 2 tablets on day 1, then 1 tablet daily on days 2 through 5    Dispense:  6 tablet    Refill:  0    Supervising Provider:   Merrilee Jansky [[id number]]   promethazine-dextromethorphan (PROMETHAZINE-DM) 6.25-15 MG/5ML syrup    Sig: Take 5 mLs by mouth 4 (four) times daily as needed.    Dispense:  118 mL    Refill:  0    Supervising Provider:   Merrilee Jansky [[id number]]   albuterol (VENTOLIN HFA) 108 (90 Base) MCG/ACT inhaler    Sig: Inhale 1-2 puffs into the lungs every 6 (six) hours as needed.    Dispense:  8 g    Refill:  0    Supervising Provider:   Merrilee Jansky [[id number]]     *If you need refills on other medications prior to your next appointment, please contact your pharmacy*  Follow-Up: Call back or seek an in-person evaluation if the symptoms worsen or if the condition fails to improve as anticipated.  [facility] [phone]  Other  Instructions  Acute Bronchitis, Adult  Acute bronchitis is sudden inflammation of the main airways (bronchi) that come off the windpipe (trachea) in the lungs. The swelling causes the airways to get smaller and make more mucus than normal. This can make it hard to breathe and can cause coughing or noisy breathing (wheezing). Acute bronchitis may last several weeks. The cough may last longer. Allergies, asthma, and exposure to smoke may make the condition worse. What are the causes? This condition can be caused by germs and by substances that irritate the lungs, including: Cold and flu viruses. The most common cause of  this condition is the virus that causes the common cold. Bacteria. This is less common. Breathing in substances that irritate the lungs, including: Smoke from cigarettes and other forms of tobacco. Dust and pollen. Fumes from household cleaning products, gases, or burned fuel. Indoor or outdoor air pollution. What increases the risk? The following factors may make you more likely to develop this condition: A weak body's defense system, also called the immune system. A condition that affects your lungs and breathing, such as asthma. What are the signs or symptoms? Common symptoms of this condition include: Coughing. This may bring up clear, yellow, or green mucus from your lungs (sputum). Wheezing. Runny or stuffy nose. Having too much mucus in your lungs (chest congestion). Shortness of breath. Aches and pains, including sore throat or chest. How is this diagnosed? This condition is usually diagnosed based on: Your symptoms and medical history. A physical exam. You may also have other tests, including tests to rule out other conditions, such as pneumonia. These tests include: A test of lung function. Test of a mucus sample to look for the presence of bacteria. Tests to check the oxygen level in your blood. Blood tests. Chest X-ray. How is this treated? Most cases of acute bronchitis clear up over time without treatment. Your health care provider may recommend: Drinking more fluids to help thin your mucus so it is easier to cough up. Taking inhaled medicine (inhaler) to improve air flow in and out of your lungs. Using a vaporizer or a humidifier. These are machines that add water to the air to help you breathe better. Taking a medicine that thins mucus and clears congestion (expectorant). Taking a medicine that prevents or stops coughing (cough suppressant). It is not common to take an antibiotic medicine for this condition. Follow these instructions at home:  Take  over-the-counter and prescription medicines only as told by your health care provider. Use an inhaler, vaporizer, or humidifier as told by your health care provider. Take two teaspoons (10 mL) of honey at bedtime to lessen coughing at night. Drink enough fluid to keep your urine pale yellow. Do not use any products that contain nicotine or tobacco. These products include cigarettes, chewing tobacco, and vaping devices, such as e-cigarettes. If you need help quitting, ask your health care provider. Get plenty of rest. Return to your normal activities as told by your health care provider. Ask your health care provider what activities are safe for you. Keep all follow-up visits. This is important. How is this prevented? To lower your risk of getting this condition again: Wash your hands often with soap and water for at least 20 seconds. If soap and water are not available, use hand sanitizer. Avoid contact with people who have cold symptoms. Try not to touch your mouth, nose, or eyes with your hands. Avoid breathing in smoke or chemical fumes. Breathing smoke or chemical  fumes will make your condition worse. Get the flu shot every year. Contact a health care provider if: Your symptoms do not improve after 2 weeks. You have trouble coughing up the mucus. Your cough keeps you awake at night. You have a fever. Get help right away if you: Cough up blood. Feel pain in your chest. Have severe shortness of breath. Faint or keep feeling like you are going to faint. Have a severe headache. Have a fever or chills that get worse. These symptoms may represent a serious problem that is an emergency. Do not wait to see if the symptoms will go away. Get medical help right away. Call your local emergency services (911 in the [place] Do not drive yourself to the hospital. Summary Acute bronchitis is inflammation of the main airways (bronchi) that come off the windpipe (trachea) in the lungs. The swelling  causes the airways to get smaller and make more mucus than normal. Drinking more fluids can help thin your mucus so it is easier to cough up. Take over-the-counter and prescription medicines only as told by your health care provider. Do not use any products that contain nicotine or tobacco. These products include cigarettes, chewing tobacco, and vaping devices, such as e-cigarettes. If you need help quitting, ask your health care provider. Contact a health care provider if your symptoms do not improve after 2 weeks. This information is not intended to replace advice given to you by your health care provider. Make sure you discuss any questions you have with your health care provider. Document Revised: 03/11/2022 Document Reviewed: 04/01/2021 Elsevier Patient Education  2024 Elsevier Inc.   If you have been instructed to have an in-person evaluation today at a local Urgent Care facility, please use the link below. It will take you to a list of all of our available [facility], including address, phone number and hours of operation. Please do not delay care.  [facility]  If you or a family member do not have a primary care provider, use the link below to schedule a visit and establish care. When you choose a [facility] primary care physician or advanced practice provider, you gain a long-term partner in health. Find a Primary Care Provider  Learn more about [facility]'s in-office and virtual care options:  - Get Care Now

## 2024-03-07 NOTE — Progress Notes (Signed)
 Virtual Visit Consent   Brooke Le, you are scheduled for a virtual visit with a [facility] provider today. Just as with appointments in the office, your consent must be obtained to participate. Your consent will be active for this visit and any virtual visit you may have with one of our providers in the next 365 days. If you have a MyChart account, a copy of this consent can be sent to you electronically.  As this is a virtual visit, video technology does not allow for your provider to perform a traditional examination. This may limit your provider's ability to fully assess your condition. If your provider identifies any concerns that need to be evaluated in person or the need to arrange testing (such as labs, EKG, etc.), we will make arrangements to do so. Although advances in technology are sophisticated, we cannot ensure that it will always work on either your end or our end. If the connection with a video visit is poor, the visit may have to be switched to a telephone visit. With either a video or telephone visit, we are not always able to ensure that we have a secure connection.  By engaging in this virtual visit, you consent to the provision of healthcare and authorize for your insurance to be billed (if applicable) for the services provided during this visit. Depending on your insurance coverage, you may receive a charge related to this service.  I need to obtain your verbal consent now. Are you willing to proceed with your visit today? Brooke Le has provided verbal consent on 03/07/2024 for a virtual visit (video or telephone). Brooke Loveless, PA-C  Date: 03/07/2024 6:39 PM   Virtual Visit via Video Note   I, Brooke Le, connected with  Brooke Le  ([id number], January 10, 1966) on 03/07/24 at  6:30 PM EDT by a video-enabled telemedicine application and verified that I am speaking with the correct person using two  identifiers.  Location: Patient: Virtual Visit Location Patient: Home Provider: Virtual Visit Location Provider: Home Office   I discussed the limitations of evaluation and management by telemedicine and the availability of in person appointments. The patient expressed understanding and agreed to proceed.    History of Present Illness: Brooke Le is a 58 y.o. who identifies as a female who was assigned female at birth, and is being seen today for cough and congestion.  HPI: URI  This is a new problem. The current episode started 1 to 4 weeks ago (Seen virtually on 02/29/24 and diagnosed with viral pharyngitis and prescribed Viscous Lidocaine). The problem has been gradually worsening. Maximum temperature: Saturday subjective fevers. The fever has been present for Less than 1 day. Associated symptoms include chest pain (with coughing only), congestion, coughing (productive), headaches, rhinorrhea (and post nasal drainage), sinus pain, a sore throat and wheezing (occasional). Pertinent negatives include no abdominal pain, diarrhea, ear pain, nausea, plugged ear sensation, sneezing or vomiting. Associated symptoms comments: Fatigue, occasional chills. She has tried increased fluids and acetaminophen (cold compress) for the symptoms. The treatment provided mild relief.     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/21/2007   Iron deficiency anemia 07/14/2007   Essential hypertension 07/14/2007    Allergies:  Allergies   Allergen Reactions   Bee Pollen Swelling   Medications:  Current Outpatient Medications:    albuterol (VENTOLIN HFA) 108 (90 Base) MCG/ACT inhaler, Inhale 1-2 puffs into the lungs every 6 (six) hours as needed., Disp: 8 g, Rfl: 0   azithromycin (ZITHROMAX) 250 MG tablet, Take 2 tablets on day 1, then 1 tablet daily on days 2 through 5, Disp: 6 tablet, Rfl: 0   promethazine-dextromethorphan (PROMETHAZINE-DM) 6.25-15 MG/5ML syrup, Take 5 mLs by mouth 4 (four) times daily as needed., Disp: 118 mL, Rfl: 0   amitriptyline (ELAVIL) 10 MG tablet, TAKE 2 TABLETS BY MOUTH AT BEDTIME, Disp: 180 tablet, Rfl: 0   amLODipine-valsartan (EXFORGE) 5-160 MG tablet, Take 1 tablet by mouth daily., Disp: 90 tablet, Rfl: 0   Cyanocobalamin (B-12) 1000 MCG SUBL, Place 1 tablet under the tongue daily., Disp: 30 tablet, Rfl: 3   diclofenac Sodium (VOLTAREN) 1 % GEL, Apply topically., Disp: , Rfl:    fluticasone (FLONASE) 50 MCG/ACT nasal spray, Place 2 sprays into both nostrils daily., Disp: 16 g, Rfl: 0   hydrochlorothiazide (HYDRODIURIL) 25 MG tablet, TAKE 1 TABLET (25 MG TOTAL) BY MOUTH DAILY., Disp: 90 tablet, Rfl: 0   lidocaine (XYLOCAINE) 2 % solution, Use as directed 10 mLs in the mouth or throat every 6 (six) hours as needed for mouth pain., Disp: 100 mL, Rfl: 0   Semaglutide-Weight Management 1 MG/0.5ML SOAJ, Inject 1 mg into the skin once a week. Increase dosing from 0.5 weekly, Disp: 2 mL, Rfl: 2   Vitamin D, Ergocalciferol, (DRISDOL) 1.25 MG (50000 UNIT) CAPS capsule, Take 1 capsule (50,000 Units total) by mouth every 7 (seven) days., Disp: 12 capsule, Rfl: 1  Observations/Objective: Patient is well-developed, well-nourished in no acute distress.  Resting comfortably at home.  Head is normocephalic, atraumatic.  No labored breathing.  Speech is clear and coherent with logical content.  Patient is alert and oriented at baseline.  Wet cough heard a few times through the call without affecting  speech   Assessment and Plan: 1. Bacterial upper respiratory infection (Primary) - azithromycin (ZITHROMAX) 250 MG tablet; Take 2 tablets on day 1, then 1 tablet daily on days 2 through 5  Dispense: 6 tablet; Refill: 0 - promethazine-dextromethorphan (PROMETHAZINE-DM) 6.25-15 MG/5ML syrup; Take 5 mLs by mouth 4 (four) times daily as needed.  Dispense: 118 mL; Refill: 0 - albuterol (VENTOLIN HFA) 108 (90 Base) MCG/ACT inhaler; Inhale 1-2 puffs into the lungs every 6 (six) hours as needed.  Dispense: 8 g; Refill: 0  - Worsening over a week despite OTC medications - Will treat with Z-pack and Promethazine DM - Albuterol as needed for chest tightness, shortness of breath and wheezing - Can continue Mucinex  - Push fluids.  - Rest.  - Steam and humidifier can help - Work note provided - Seek in person evaluation if worsening or symptoms fail to improve    Follow Up Instructions: I discussed the assessment and treatment plan with the patient. The patient was provided an opportunity to ask questions and all were answered. The patient agreed with the plan and demonstrated an understanding of the instructions.  A copy of instructions were sent to the patient via MyChart unless otherwise noted below.    The patient was advised to call back or seek an  in-person evaluation if the symptoms worsen or if the condition fails to improve as anticipated.    Brooke Loveless, PA-C

## 2024-03-13 ENCOUNTER — Encounter: Payer: Self-pay | Admitting: Family Medicine

## 2024-03-13 ENCOUNTER — Ambulatory Visit

## 2024-03-13 ENCOUNTER — Ambulatory Visit (INDEPENDENT_AMBULATORY_CARE_PROVIDER_SITE_OTHER): Admitting: Family Medicine

## 2024-03-13 VITALS — BP 100/64 | HR 79 | Temp 98.1°F | Resp 16 | Ht 68.0 in | Wt 237.0 lb

## 2024-03-13 DIAGNOSIS — I10 Essential (primary) hypertension: Secondary | ICD-10-CM

## 2024-03-13 DIAGNOSIS — R059 Cough, unspecified: Secondary | ICD-10-CM

## 2024-03-13 DIAGNOSIS — R0602 Shortness of breath: Secondary | ICD-10-CM

## 2024-03-13 DIAGNOSIS — E876 Hypokalemia: Secondary | ICD-10-CM

## 2024-03-13 LAB — CBC
HCT: 36.1 % (ref 36.0–46.0)
Hemoglobin: 12.1 g/dL (ref 12.0–15.0)
MCHC: 33.6 g/dL (ref 30.0–36.0)
MCV: 87.6 fl (ref 78.0–100.0)
Platelets: 402 10*3/uL — ABNORMAL HIGH (ref 150.0–400.0)
RBC: 4.12 Mil/uL (ref 3.87–5.11)
RDW: 13.9 % (ref 11.5–15.5)
WBC: 5.6 10*3/uL (ref 4.0–10.5)

## 2024-03-13 LAB — BASIC METABOLIC PANEL WITH GFR
BUN: 19 mg/dL (ref 6–23)
CO2: 26 meq/L (ref 19–32)
Calcium: 9.2 mg/dL (ref 8.4–10.5)
Chloride: 104 meq/L (ref 96–112)
Creatinine, Ser: 1.38 mg/dL — ABNORMAL HIGH (ref 0.40–1.20)
GFR: 42.34 mL/min — ABNORMAL LOW (ref >60.00)
Glucose, Bld: 94 mg/dL (ref 70–99)
Potassium: 3.3 meq/L — ABNORMAL LOW (ref 3.5–5.1)
Sodium: 140 meq/L (ref 135–145)

## 2024-03-13 MED ORDER — POTASSIUM CHLORIDE ER 10 MEQ PO TBCR: 20.0000 meq | EXTENDED_RELEASE_TABLET | Freq: Every day | ORAL | 0 refills | Status: DC

## 2024-03-13 NOTE — Progress Notes (Addendum)
 ACUTE VISIT Chief Complaint  Patient presents with   Cough    Productive, ongoing since 3/19.    Sore Throat   Headache   HPI: Brooke Le is a 58 y.o. female with a PMHx significant for Migraine, HTN, rhinitis, multinodular goiter, fibromyalgia, iron deficiency anemia, and vitamin D deficiency, who is here today with above symptoms.  Patient complains of productive cough, fatigue, chills, wheezing, frontal headaches, and SOB with any activity since 3/19.  She had virtual appointments for this problem on 3/19 and 3/25, and was given a course of azithromycin and albuterol inhaler. She says she is using the albuterol twice daily, and it helps when she uses it.  No hx of seasonal allergies, asthma, or smoking.  Pertinent negatives include fever, hemoptysis, or ear ache.  Lab Results  Component Value Date   WBC 4.7 01/11/2024   HGB 12.5 01/11/2024   HCT 37.8 01/11/2024   MCV 88.3 01/11/2024   PLT 272.0 01/11/2024   HTN:  Currently on amlodipine-valsartan 5-160 mg daily and hydrochlorothiazide 25 mg daily.  She checks her BP occasionally at home. Today in the office it is 100/64.  Lab Results  Component Value Date   NA 139 01/11/2024   CL 104 01/11/2024   K 3.4 (L) 01/11/2024   CO2 28 01/11/2024   BUN 13 01/11/2024   CREATININE 1.02 01/11/2024   GFR 60.92 01/11/2024   CALCIUM 9.4 01/11/2024   ALBUMIN 4.0 01/11/2024   GLUCOSE 80 01/11/2024   Review of Systems  Constitutional:  Positive for activity change, appetite change and fatigue.  HENT:  Positive for sore throat. Negative for facial swelling and trouble swallowing.   Eyes:  Negative for photophobia and visual disturbance.  Cardiovascular:  Negative for chest pain, palpitations and leg swelling.  Gastrointestinal:  Negative for abdominal pain, nausea and vomiting.  Genitourinary:  Negative for decreased urine volume, dysuria and hematuria.  Musculoskeletal:  Negative for gait problem.  Skin:  Negative  for rash.  Allergic/Immunologic: Positive for environmental allergies.  Neurological:  Positive for headaches. Negative for syncope and facial asymmetry.  Psychiatric/Behavioral:  Negative for confusion and hallucinations.   See other pertinent positives and negatives in HPI.  Current Outpatient Medications on File Prior to Visit  Medication Sig Dispense Refill   albuterol (VENTOLIN HFA) 108 (90 Base) MCG/ACT inhaler Inhale 1-2 puffs into the lungs every 6 (six) hours as needed. 8 g 0   amitriptyline (ELAVIL) 10 MG tablet TAKE 2 TABLETS BY MOUTH AT BEDTIME 180 tablet 0   amLODipine-valsartan (EXFORGE) 5-160 MG tablet Take 1 tablet by mouth daily. 90 tablet 0   Cyanocobalamin (B-12) 1000 MCG SUBL Place 1 tablet under the tongue daily. 30 tablet 3   diclofenac Sodium (VOLTAREN) 1 % GEL Apply topically.     fluticasone (FLONASE) 50 MCG/ACT nasal spray Place 2 sprays into both nostrils daily. 16 g 0   hydrochlorothiazide (HYDRODIURIL) 25 MG tablet TAKE 1 TABLET (25 MG TOTAL) BY MOUTH DAILY. 90 tablet 0   lidocaine (XYLOCAINE) 2 % solution Use as directed 10 mLs in the mouth or throat every 6 (six) hours as needed for mouth pain. 100 mL 0   promethazine-dextromethorphan (PROMETHAZINE-DM) 6.25-15 MG/5ML syrup Take 5 mLs by mouth 4 (four) times daily as needed. 118 mL 0   Semaglutide-Weight Management 1 MG/0.5ML SOAJ Inject 1 mg into the skin once a week. Increase dosing from 0.5 weekly 2 mL 2   Vitamin D, Ergocalciferol, (DRISDOL) 1.25 MG (50000  UNIT) CAPS capsule Take 1 capsule (50,000 Units total) by mouth every 7 (seven) days. 12 capsule 1   No current facility-administered medications on file prior to visit.   Past Medical History:  Diagnosis Date   Allergic rhinitis    Allergy    Anemia, iron deficiency    Anxiety    Fibromyalgia    Gynecological examination    Dr. 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   Qualifier: Diagnosis of  By: Fabian Sharp MD, Neta Mends    Syncope    ER evaluation in 2008; had anemia then   Allergies  Allergen Reactions   Bee Pollen Swelling    Social History   Socioeconomic History   Marital status: Legally Separated    Spouse name: Not on file   Number of children: Not on file   Years of education: Not on file   Highest education level: Some college, no degree  Occupational History   Not on file  Tobacco Use   Smoking status: Never   Smokeless tobacco: Never  Vaping Use   Vaping status: Never Used  Substance and Sexual Activity   Alcohol use: Yes    Comment: socially    Drug use: Never   Sexual activity: Not on file  Other Topics Concern   Not on file  Social History Narrative   Married   Regular Exercise- no   Laid off [employer]- July 2011 lost insurance in Jan.2012   Back to work at [employer] long shifts    Now am Public relations account executive   hh of 6 teens    No pets             Social Drivers of Corporate investment banker Strain: Low Risk  (11/09/2023)   Overall Financial Resource Strain (CARDIA)    Difficulty of Paying Living Expenses: Not very hard  Food Insecurity: No Food Insecurity (11/09/2023)   Hunger Vital Sign    Worried About Running Out of Food in the Last Year: Never true    Ran Out of Food in the Last Year: Never true  Transportation Needs: No Transportation Needs (11/09/2023)   PRAPARE - Administrator, Civil Service (Medical): No    Lack of Transportation (Non-Medical): No  Physical Activity: Insufficiently Active (11/09/2023)   Exercise Vital Sign    Days of Exercise per Week: 1 day    Minutes of Exercise per Session: 10 min  Stress: No Stress Concern Present (11/09/2023)   [employer] of Occupational Health - Occupational Stress  Questionnaire    Feeling of Stress : Only a little  Social Connections: Socially Integrated (11/09/2023)   Social Connection and Isolation Panel [NHANES]    Frequency of Communication with Friends and Family: More than three times a week    Frequency of Social Gatherings with Friends and Family: Once a week    Attends Religious Services: More than 4 times per year    Active Member of [employer] or Organizations: Yes    Attends Banker Meetings: 1 to 4 times per year    Marital Status: Living with partner   Vitals:  03/13/24 1348  BP: 100/64  Pulse: 79  Resp: 16  Temp: 98.1 F (36.7 C)  SpO2: 100%   Body mass index is 36.04 kg/m.  Physical Exam Vitals and nursing note reviewed.  Constitutional:      General: She is not in acute distress.    Appearance: She is well-developed. She is not ill-appearing.  HENT:     Head: Normocephalic and atraumatic.     Right Ear: Tympanic membrane, ear canal and external ear normal.     Left Ear: Tympanic membrane, ear canal and external ear normal.     Nose: Rhinorrhea present.     Right Turbinates: Enlarged.     Left Turbinates: Enlarged.     Mouth/Throat:     Mouth: Mucous membranes are moist.     Pharynx: Uvula midline. Postnasal drip present.  Eyes:     Conjunctiva/sclera: Conjunctivae normal.  Cardiovascular:     Rate and Rhythm: Normal rate and regular rhythm.     Heart sounds: No murmur heard. Pulmonary:     Effort: Pulmonary effort is normal. No respiratory distress.     Breath sounds: Normal breath sounds. No stridor. No wheezing.  Abdominal:     Palpations: Abdomen is soft. There is no mass.     Tenderness: There is no abdominal tenderness.  Musculoskeletal:     Cervical back: Neck supple.     Right lower leg: No edema.     Left lower leg: No edema.  Lymphadenopathy:     Cervical: No cervical adenopathy.  Skin:    General: Skin is warm.     Findings: No erythema or rash.  Neurological:     Mental Status: She  is alert and oriented to person, place, and time.  Psychiatric:        Mood and Affect: Mood and affect normal.   ASSESSMENT AND PLAN:  Ms. Judd Gaudier was seen today for URI symptoms. Lab Results  Component Value Date   NA 140 03/13/2024   CL 104 03/13/2024   K 3.3 (L) 03/13/2024   CO2 26 03/13/2024   BUN 19 03/13/2024   CREATININE 1.38 (H) 03/13/2024   GFR 42.34 (L) 03/13/2024   CALCIUM 9.2 03/13/2024   ALBUMIN 4.0 01/11/2024   GLUCOSE 94 03/13/2024   Lab Results  Component Value Date   WBC 5.6 03/13/2024   HGB 12.1 03/13/2024   HCT 36.1 03/13/2024   MCV 87.6 03/13/2024   PLT 402.0 (H) 03/13/2024   SOB (shortness of breath) Vs fatigue/physical conditioning due to recent illness. Lung auscultation negative today, normal pulse ox, not in respiratory distress. I do not think she needs to go to the ER at this time but she was clearly instructed about warning signs. Further recommendation will be given according to imaging results.  -     Basic metabolic panel with GFR; Future -     CBC; Future -     DG Chest 2 View; Future  Essential hypertension Today BP on lower normal range, reporting SBP under 100 at home. Recommend decreasing amlodipine-valsartan from 1 tablet to 1/2 tablet daily.  For now no changes in HCTZ. Continue monitoring BP regularly. Follow-up with PCP in 2 weeks, before if needed.  Cough, unspecified type We discussed possible etiologies, most likely postviral and aggravated by seasonal allergies. Lung auscultation today negative, because of associated dyspnea, chest x-ray was ordered. She reports intermittent wheezing at home, not appreciated today. We discussed the possibility of short-term prednisone, we discussed side  effects, she prefers to hold on this for now. Continue albuterol inhaler 2 puff every 6 hours as needed. Monitor for fever. Instructed about warning signs.  -     DG Chest 2 View; Future  Hypokalemia  Mild with associated  worsening creatinine and EGFR. Will recommend K-Lor 10 mK 2 tablets daily for 5 days. Stop hydrochlorothiazide. Increase fluid intake. Potassium can be repeated in 2 weeks, before if needed.  Tried to reach patient to discuss lab results at 6:01 pm, no answer an voice mail has not been set up.  Try mobile phone number, left message.  Return in about 2 weeks (around 03/27/2024), or SOB with PCP and HTN.  I, Rolla Etienne Wierda, acting as a scribe for Jailee Jaquez [place], MD., have documented all relevant documentation on the behalf of Adhya Cocco [place], MD, as directed by  Gibson Lad [place], MD while in the presence of Chasity Outten [place], MD.   I, Hiliana Eilts [place], MD, have reviewed all documentation for this visit. The documentation on 03/13/24 for the exam, diagnosis, procedures, and orders are all accurate and complete.  Timothee Gali G. [place], MD  [facility]. [place] office.

## 2024-03-13 NOTE — Patient Instructions (Addendum)
 A few things to remember from today's visit:  SOB (shortness of breath) - Plan: Basic metabolic panel with GFR, CBC, DG Chest 2 View  Essential hypertension  Cough, unspecified type - Plan: DG Chest 2 View  Decrease blood pressure med to 1/2 tab daily. Monitor temp. Continue Albuterol inh as needed.  If you need refills for medications you take chronically, please call your pharmacy. Do not use My Chart to request refills or for acute issues that need immediate attention. If you send a my chart message, it may take a few days to be addressed, specially if I am not in the office.  Please be sure medication list is accurate. If a new problem present, please set up appointment sooner than planned today.

## 2024-03-14 ENCOUNTER — Ambulatory Visit: Admitting: Internal Medicine

## 2024-03-21 ENCOUNTER — Encounter: Payer: Self-pay | Admitting: Family Medicine

## 2024-03-28 NOTE — Progress Notes (Deleted)
 No chief complaint on file.   HPI: Brooke Le 58 y.o. come in for   fu visit seing dr [place] for dyspnea w prolonged resp infection Bp med  dose was adjusted down cause of  low nl bp range 100  and plan fu  Albuterol  helped   Low potassium  ROS: See pertinent positives and negatives per HPI.  Past Medical History:  Diagnosis Date   Allergic rhinitis    Allergy    Anemia, iron deficiency    Anxiety    Fibromyalgia    Gynecological examination    Dr. 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   Qualifier: Diagnosis of  By: Fabian Sharp MD, Neta Mends    Syncope    ER evaluation in 2008; had anemia then    Family History  Problem Relation Age of Onset   Prostate cancer Father    Esophageal cancer Maternal Aunt    Colon cancer Maternal Uncle    Colon cancer Cousin    Alcohol abuse Other    Diabetes Other    Hypertension Other    Colon cancer Other    Rectal cancer Neg Hx    Stomach cancer Neg Hx    Breast cancer Neg Hx     Social History   Socioeconomic History   Marital status: Legally Separated    Spouse name: Not on file   Number of children: Not on file   Years of education: Not on file   Highest education level: Some college, no degree  Occupational History   Not on file  Tobacco Use   Smoking status: Never   Smokeless tobacco: Never  Vaping Use   Vaping status: Never Used  Substance and Sexual Activity   Alcohol use: Yes    Comment: socially    Drug use: Never   Sexual activity: Not on file  Other Topics Concern   Not on file  Social History Narrative   Married   Regular Exercise- no   Laid off [employer]- July 2011 lost insurance in Jan.2012   Back to work at [employer] long shifts     Now am Public relations account executive   hh of 6 teens    No pets             Social Drivers of Corporate investment banker Strain: Low Risk  (11/09/2023)   Overall Financial Resource Strain (CARDIA)    Difficulty of Paying Living Expenses: Not very hard  Food Insecurity: No Food Insecurity (11/09/2023)   Hunger Vital Sign    Worried About Running Out of Food in the Last Year: Never true    Ran Out of Food in the Last Year: Never true  Transportation Needs: No Transportation Needs (11/09/2023)   PRAPARE - Administrator, Civil Service (Medical): No    Lack of Transportation (Non-Medical): No  Physical Activity: Insufficiently Active (11/09/2023)   Exercise Vital Sign    Days of Exercise per Week: 1 day    Minutes of Exercise per Session: 10 min  Stress: No Stress Concern Present (11/09/2023)   [employer] of Occupational Health - Occupational Stress  Questionnaire    Feeling of Stress : Only a little  Social Connections: Socially Integrated (11/09/2023)   Social Connection and Isolation Panel [NHANES]    Frequency of Communication with Friends and Family: More than three times a week    Frequency of Social Gatherings with Friends and Family: Once a week    Attends Religious Services: More than 4 times per year    Active Member of [employer] or Organizations: Yes    Attends Banker Meetings: 1 to 4 times per year    Marital Status: Living with partner    Outpatient Medications Prior to Visit  Medication Sig Dispense Refill   albuterol (VENTOLIN HFA) 108 (90 Base) MCG/ACT inhaler Inhale 1-2 puffs into the lungs every 6 (six) hours as needed. 8 g 0   amitriptyline (ELAVIL) 10 MG tablet TAKE 2 TABLETS BY MOUTH AT BEDTIME 180 tablet 0   amLODipine-valsartan (EXFORGE) 5-160 MG tablet Take 1 tablet by mouth daily. 90 tablet 0   Cyanocobalamin (B-12) 1000 MCG SUBL Place 1 tablet under the tongue daily. 30 tablet 3   diclofenac Sodium (VOLTAREN) 1 % GEL Apply topically.      fluticasone (FLONASE) 50 MCG/ACT nasal spray Place 2 sprays into both nostrils daily. 16 g 0   hydrochlorothiazide (HYDRODIURIL) 25 MG tablet TAKE 1 TABLET (25 MG TOTAL) BY MOUTH DAILY. 90 tablet 0   lidocaine (XYLOCAINE) 2 % solution Use as directed 10 mLs in the mouth or throat every 6 (six) hours as needed for mouth pain. 100 mL 0   potassium chloride (KLOR-CON) 10 MEQ tablet Take 2 tablets (20 mEq total) by mouth daily for 5 days. 10 tablet 0   promethazine-dextromethorphan (PROMETHAZINE-DM) 6.25-15 MG/5ML syrup Take 5 mLs by mouth 4 (four) times daily as needed. 118 mL 0   Semaglutide-Weight Management 1 MG/0.5ML SOAJ Inject 1 mg into the skin once a week. Increase dosing from 0.5 weekly 2 mL 2   Vitamin D, Ergocalciferol, (DRISDOL) 1.25 MG (50000 UNIT) CAPS capsule Take 1 capsule (50,000 Units total) by mouth every 7 (seven) days. 12 capsule 1   No facility-administered medications prior to visit.     EXAM:  LMP 10/04/2018 (Approximate)   There is no height or weight on file to calculate BMI.  GENERAL: vitals reviewed and listed above, alert, oriented, appears well hydrated and in no acute distress HEENT: atraumatic, conjunctiva  clear, no obvious abnormalities on inspection of external nose and ears OP : no lesion edema or exudate  NECK: no obvious masses on inspection palpation  LUNGS: clear to auscultation bilaterally, no wheezes, rales or rhonchi, good air movement CV: HRRR, no clubbing cyanosis or  peripheral edema nl cap refill  MS: moves all extremities without noticeable focal  abnormality PSYCH: pleasant and cooperative, no obvious depression or anxiety Lab Results  Component Value Date   WBC 5.6 03/13/2024   HGB 12.1 03/13/2024   HCT 36.1 03/13/2024   PLT 402.0 (H) 03/13/2024   GLUCOSE 94 03/13/2024   CHOL 163 01/11/2024   TRIG 65.0 01/11/2024   HDL 39.90 01/11/2024   LDLCALC 110 (H) 01/11/2024   ALT 17 01/11/2024   AST 22 01/11/2024   NA 140 03/13/2024   K  3.3 (L) 03/13/2024   CL 104 03/13/2024   CREATININE 1.38 (H) 03/13/2024   BUN 19 03/13/2024   CO2 26 03/13/2024   TSH 0.88 01/11/2024   HGBA1C 5.6 01/11/2024   BP Readings from Last 3 Encounters:  03/13/24 100/64  11/09/23 100/68  09/07/23 104/66    ASSESSMENT AND PLAN:  Discussed the following assessment and plan:  No diagnosis found. Fu potassium  -Patient advised to return or notify health care team  if  new concerns arise.  There are no Patient Instructions on file for this visit.   Evo Aderman K. Harley Mccartney M.D.

## 2024-03-29 ENCOUNTER — Ambulatory Visit: Admitting: Internal Medicine

## 2024-04-07 ENCOUNTER — Other Ambulatory Visit: Payer: Self-pay | Admitting: Family

## 2024-04-26 ENCOUNTER — Ambulatory Visit: Admitting: Dietician

## 2024-05-02 ENCOUNTER — Encounter: Payer: Self-pay | Admitting: Internal Medicine

## 2024-05-02 ENCOUNTER — Ambulatory Visit (INDEPENDENT_AMBULATORY_CARE_PROVIDER_SITE_OTHER): Admitting: Internal Medicine

## 2024-05-02 VITALS — BP 126/90 | HR 67 | Temp 97.8°F | Ht 68.0 in | Wt 238.6 lb

## 2024-05-02 DIAGNOSIS — E559 Vitamin D deficiency, unspecified: Secondary | ICD-10-CM | POA: Diagnosis not present

## 2024-05-02 DIAGNOSIS — R748 Abnormal levels of other serum enzymes: Secondary | ICD-10-CM

## 2024-05-02 DIAGNOSIS — Z79899 Other long term (current) drug therapy: Secondary | ICD-10-CM

## 2024-05-02 DIAGNOSIS — I10 Essential (primary) hypertension: Secondary | ICD-10-CM

## 2024-05-02 LAB — BASIC METABOLIC PANEL WITH GFR
BUN: 12 mg/dL (ref 6–23)
CO2: 30 meq/L (ref 19–32)
Calcium: 9 mg/dL (ref 8.4–10.5)
Chloride: 107 meq/L (ref 96–112)
Creatinine, Ser: 0.91 mg/dL (ref 0.40–1.20)
GFR: 69.71 mL/min (ref >60.00)
Glucose, Bld: 82 mg/dL (ref 70–99)
Potassium: 3.5 meq/L (ref 3.5–5.1)
Sodium: 142 meq/L (ref 135–145)

## 2024-05-02 LAB — MAGNESIUM: Magnesium: 1.9 mg/dL (ref 1.5–2.5)

## 2024-05-02 LAB — ALKALINE PHOSPHATASE: Alkaline Phosphatase: 120 U/L — ABNORMAL HIGH (ref 39–117)

## 2024-05-02 LAB — VITAMIN D 25 HYDROXY (VIT D DEFICIENCY, FRACTURES): VITD: 43.49 ng/mL (ref 30.00–100.00)

## 2024-05-02 MED ORDER — SEMAGLUTIDE-WEIGHT MANAGEMENT 1 MG/0.5ML ~~LOC~~ SOAJ: 1.0000 mg | SUBCUTANEOUS | 1 refills | Status: DC

## 2024-05-02 MED ORDER — AMLODIPINE BESYLATE-VALSARTAN 5-160 MG PO TABS: 1.0000 | ORAL_TABLET | Freq: Every day | ORAL | 0 refills | Status: DC

## 2024-05-02 MED ORDER — HYDROCHLOROTHIAZIDE 25 MG PO TABS: 25.0000 mg | ORAL_TABLET | Freq: Every day | ORAL | 0 refills | Status: DC

## 2024-05-02 NOTE — Progress Notes (Signed)
  Wt Readings from Last 3 Encounters:  05/02/24 238 lb 9.6 oz (108.2 kg)  03/13/24 237 lb (107.5 kg)  01/18/24 257 lb (116.6 kg)   Provider was delayed and patient had to leave  Di send her to the lab to get  previous lab orders completed . Plan fu virtual visit  to review results and progress  Today refilled  medication  May still need to address medications and  the potassium that was low at last visit with dr [place] . Aaron Aas

## 2024-05-03 ENCOUNTER — Other Ambulatory Visit (HOSPITAL_COMMUNITY): Payer: Self-pay

## 2024-05-03 ENCOUNTER — Telehealth: Payer: Self-pay

## 2024-05-03 NOTE — Telephone Encounter (Signed)
 Pharmacy Patient Advocate Encounter   Received notification from Patient Pharmacy that prior authorization for Wegovy  1 is required/requested.   Insurance verification completed.   The patient is insured through [facility] ADVANTAGE/RX ADVANCE .   Per test claim: PA required; PA submitted to above mentioned insurance via CoverMyMeds Key/confirmation #/EOC [id number] Status is pending

## 2024-05-03 NOTE — Telephone Encounter (Signed)
 Pharmacy Patient Advocate Encounter  Received notification from [facility] ADVANTAGE/RX ADVANCE that Prior Authorization for Wegovy  1 has been APPROVED from 05/03/24 to 05/03/25. Unable to obtain price due to refill too soon rejection, last fill date 05/03/24 next available fill date 05/23/24   PA #/Case ID/Reference #: [id number]

## 2024-05-04 NOTE — Telephone Encounter (Signed)
Attempted to reach pt.  No answer.  Will try again.

## 2024-05-05 LAB — PTH, INTACT AND CALCIUM
Calcium: 8.9 mg/dL (ref 8.6–10.4)
PTH: 52 pg/mL (ref 16–77)

## 2024-05-05 LAB — NUCLEOTIDASE, 5', BLOOD: 5-Nucleotidase: 2 U/L (ref 0–10)

## 2024-05-07 ENCOUNTER — Ambulatory Visit: Payer: Self-pay | Admitting: Internal Medicine

## 2024-05-07 NOTE — Progress Notes (Signed)
 Potassium low normal  vit d is better  will discuss at fu visit this week

## 2024-05-08 ENCOUNTER — Ambulatory Visit: Admitting: Internal Medicine

## 2024-05-08 ENCOUNTER — Other Ambulatory Visit (HOSPITAL_COMMUNITY): Payer: Self-pay

## 2024-05-08 ENCOUNTER — Encounter: Payer: Self-pay | Admitting: Internal Medicine

## 2024-05-08 VITALS — BP 130/92 | HR 70 | Temp 97.8°F | Ht 68.0 in | Wt 235.0 lb

## 2024-05-08 DIAGNOSIS — Z9884 Bariatric surgery status: Secondary | ICD-10-CM

## 2024-05-08 DIAGNOSIS — E559 Vitamin D deficiency, unspecified: Secondary | ICD-10-CM

## 2024-05-08 DIAGNOSIS — Z79899 Other long term (current) drug therapy: Secondary | ICD-10-CM | POA: Diagnosis not present

## 2024-05-08 DIAGNOSIS — I10 Essential (primary) hypertension: Secondary | ICD-10-CM | POA: Diagnosis not present

## 2024-05-08 DIAGNOSIS — R748 Abnormal levels of other serum enzymes: Secondary | ICD-10-CM

## 2024-05-08 MED ORDER — VITAMIN D (ERGOCALCIFEROL) 1.25 MG (50000 UNIT) PO CAPS: 50000.0000 [IU] | ORAL_CAPSULE | ORAL | 1 refills | Status: AC

## 2024-05-08 MED ORDER — SPIRONOLACTONE 25 MG PO TABS: 25.0000 mg | ORAL_TABLET | Freq: Every day | ORAL | 2 refills | Status: DC

## 2024-05-08 MED ORDER — SEMAGLUTIDE-WEIGHT MANAGEMENT 1.7 MG/0.75ML ~~LOC~~ SOAJ: 1.7000 mg | SUBCUTANEOUS | 2 refills | Status: DC

## 2024-05-08 NOTE — Patient Instructions (Addendum)
 Potassium is better    but would ike diastolic BP to be better   We can  try spironolactone that can help.  potassium and BP readings. NO potassium or hydrochlorothiazide  for now.   So lets try this and get  bmp ( potassium level in    2-3 weeks after change .

## 2024-05-08 NOTE — Progress Notes (Signed)
 Chief Complaint  Patient presents with   Medical Management of Chronic Issues    HPI: Brooke Le 58 y.o. come in for Chronic disease management   BP :  exforge  has been off potassium and hydrochlorothiazide  since last visit  Obesity:  s/p bar surgery and now on wegovy   began at 286  ? Increase dosing  NO cv pulm sx now Feels generally well  Bp diastolic seems to be up some but  no sx   ROS: See pertinent positives and negatives per HPI. Had resp infection early April a lot better now  Work from home   has helped work situation  less  commute Past Medical History:  Diagnosis Date   Allergic rhinitis    Allergy    Anemia, iron deficiency    Anxiety    Fibromyalgia    Gynecological examination    Dr. 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   Qualifier: Diagnosis of  By: Ethel Henry MD, Joaquim Muir    Syncope    ER evaluation in 2008; had anemia then    Family History  Problem Relation Age of Onset   Prostate cancer Father    Esophageal cancer Maternal Aunt    Colon cancer Maternal Uncle    Colon cancer Cousin    Alcohol abuse Other    Diabetes Other    Hypertension Other    Colon cancer Other    Rectal cancer Neg Hx    Stomach cancer Neg Hx    Breast cancer Neg Hx     Social History   Socioeconomic History   Marital status: Legally Separated    Spouse name: Not on file   Number of children: Not on file   Years of education: Not on file   Highest education level: Some college, no degree  Occupational History   Not on file  Tobacco Use   Smoking status: Never   Smokeless tobacco: Never  Vaping Use   Vaping status: Never Used  Substance and Sexual Activity   Alcohol use: Yes    Comment: socially    Drug use:  Never   Sexual activity: Not on file  Other Topics Concern   Not on file  Social History Narrative   Married   Regular Exercise- no   Laid off [employer]- July 2011 lost insurance in Jan.2012   Back to work at [employer] long shifts    Now am Public relations account executive   hh of 6 teens    No pets             Social Drivers of Corporate investment banker Strain: Low Risk  (11/09/2023)   Overall Financial Resource Strain (CARDIA)    Difficulty of Paying Living Expenses: Not very hard  Food Insecurity: No Food Insecurity (11/09/2023)   Hunger Vital Sign    Worried About Running Out of Food in the Last Year: Never true    Ran Out of Food in the Last Year: Never true  Transportation Needs: No Transportation Needs (11/09/2023)   PRAPARE - Administrator, Civil Service (Medical): No    Lack of Transportation (Non-Medical):  No  Physical Activity: Insufficiently Active (11/09/2023)   Exercise Vital Sign    Days of Exercise per Week: 1 day    Minutes of Exercise per Session: 10 min  Stress: No Stress Concern Present (11/09/2023)   [employer] of Occupational Health - Occupational Stress Questionnaire    Feeling of Stress : Only a little  Social Connections: Socially Integrated (11/09/2023)   Social Connection and Isolation Panel [NHANES]    Frequency of Communication with Friends and Family: More than three times a week    Frequency of Social Gatherings with Friends and Family: Once a week    Attends Religious Services: More than 4 times per year    Active Member of [employer] or Organizations: Yes    Attends Banker Meetings: 1 to 4 times per year    Marital Status: Living with partner    Outpatient Medications Prior to Visit  Medication Sig Dispense Refill   amitriptyline  (ELAVIL ) 10 MG tablet TAKE 2 TABLETS BY MOUTH AT BEDTIME 180 tablet 0   amLODipine -valsartan  (EXFORGE ) 5-160 MG tablet Take 1 tablet by mouth daily. 90 tablet 0    Cyanocobalamin  (B-12) 1000 MCG SUBL Place 1 tablet under the tongue daily. 30 tablet 3   hydrochlorothiazide  (HYDRODIURIL ) 25 MG tablet Take 1 tablet (25 mg total) by mouth daily. 90 tablet 0   Semaglutide -Weight Management 1 MG/0.5ML SOAJ Inject 1 mg into the skin once a week. Increase dosing from 0.5 weekly 2 mL 1   Vitamin D , Ergocalciferol , (DRISDOL ) 1.25 MG (50000 UNIT) CAPS capsule Take 1 capsule (50,000 Units total) by mouth every 7 (seven) days. 12 capsule 1   potassium chloride  (KLOR-CON ) 10 MEQ tablet Take 2 tablets (20 mEq total) by mouth daily for 5 days. 10 tablet 0   No facility-administered medications prior to visit.     EXAM:  BP (!) 130/92 (BP Location: Left Arm, Patient Position: Sitting, Cuff Size: Large)   Pulse 70   Temp 97.8 F (36.6 C) (Oral)   Ht 5\' 8"  (1.727 m)   Wt 235 lb (106.6 kg)   LMP 10/04/2018 (Approximate)   SpO2 96%   BMI 35.73 kg/m   Body mass index is 35.73 kg/m. Wt Readings from Last 3 Encounters:  05/08/24 235 lb (106.6 kg)  05/02/24 238 lb 9.6 oz (108.2 kg)  03/13/24 237 lb (107.5 kg)    GENERAL: vitals reviewed and listed above, alert, oriented, appears well hydrated and in no acute distress HEENT: atraumatic, conjunctiva  clear, no obvious abnormalities on inspection of external nose and ears NECK: no obvious masses on inspection palpation  LUNGS: clear to auscultation bilaterally, no wheezes, rales or rhonchi, good air movement CV: HRRR, no clubbing cyanosis or  peripheral edema nl cap refill  MS: moves all extremities without noticeable focal  abnormality PSYCH: pleasant and cooperative, no obvious depression or anxiety Lab Results  Component Value Date   WBC 5.6 03/13/2024   HGB 12.1 03/13/2024   HCT 36.1 03/13/2024   PLT 402.0 (H) 03/13/2024   GLUCOSE 82 05/02/2024   CHOL 163 01/11/2024   TRIG 65.0 01/11/2024   HDL 39.90 01/11/2024   LDLCALC 110 (H) 01/11/2024   ALT 17 01/11/2024   AST 22 01/11/2024   NA 142 05/02/2024    K 3.5 05/02/2024   CL 107 05/02/2024   CREATININE 0.91 05/02/2024   BUN 12 05/02/2024   CO2 30 05/02/2024   TSH 0.88 01/11/2024   HGBA1C 5.6 01/11/2024   BP Readings  from Last 3 Encounters:  05/08/24 (!) 130/92  05/02/24 (!) 126/90  03/13/24 100/64    ASSESSMENT AND PLAN:  Discussed the following assessment and plan:  Medication management - Plan: Basic metabolic panel with GFR  Essential hypertension - Plan: Basic metabolic panel with GFR  Morbid obesity (HCC)  Elevated alkaline phosphatase level  Vitamin D  deficiency  Status post bariatric surgery Alk phos better almost nl  and vit d is better  Metabolic better   last check was with illness but  low potassium has been an issue for a while  . Potassium of  3.5 was off  hydrochlorothiazide  and potassium and on arb .  Will try adding spironolactone once a day 25 mg  for best control and  check bmp in 2-3 weeks to ensure metabolic in normal range .  Inc wegovy  to 1.7 /week  some success  remote working and   fast eat cycle 8 hours . Has lost weight from 283 range  Will continue high dose vit d for now since off seems to dip and may be from hx of prev  bariatric surgery and poor  absorption .  Plan fu in 3 months if all ok . Continue lifestyle intervention healthy eating and exercise .   -Patient advised to return or notify health care team  if  new concerns arise. In interim  Patient Instructions  Potassium is better    but would ike diastolic BP to be better   We can  try spironolactone that can help.  potassium and BP readings. NO potassium or hydrochlorothiazide  for now.   So lets try this and get  bmp ( potassium level in    2-3 weeks after change .    Moshe Wenger K. Noell Shular M.D.

## 2024-05-14 NOTE — Telephone Encounter (Signed)
 Contacted pharmacy to follow up. The pharmacy tech inform that insurance went through and already been filled.

## 2024-05-30 ENCOUNTER — Encounter: Payer: Self-pay | Admitting: Internal Medicine

## 2024-06-27 ENCOUNTER — Ambulatory Visit: Admitting: Internal Medicine

## 2024-06-27 ENCOUNTER — Ambulatory Visit

## 2024-07-29 ENCOUNTER — Other Ambulatory Visit: Payer: Self-pay | Admitting: Internal Medicine

## 2024-07-31 ENCOUNTER — Ambulatory Visit: Admitting: Internal Medicine

## 2024-07-31 NOTE — Telephone Encounter (Signed)
 See last notes  this medication should have been dc ed and spironolactone  added  and lab fu planned to check potassium etc.

## 2024-08-01 NOTE — Telephone Encounter (Signed)
 Attempted to reach pt. Left a voicemail to call us back.

## 2024-08-01 NOTE — Telephone Encounter (Signed)
 Attempted to follow up with pt. Call unable to complete at this time.

## 2024-08-03 NOTE — Telephone Encounter (Signed)
 Attempted to reach pt. Call cannot completed at this time.

## 2024-08-04 ENCOUNTER — Other Ambulatory Visit: Payer: Self-pay | Admitting: Internal Medicine

## 2024-08-08 ENCOUNTER — Other Ambulatory Visit: Payer: Self-pay | Admitting: Internal Medicine

## 2024-08-16 ENCOUNTER — Ambulatory Visit: Admitting: Internal Medicine

## 2024-09-01 ENCOUNTER — Other Ambulatory Visit: Payer: Self-pay | Admitting: Internal Medicine

## 2024-09-04 ENCOUNTER — Ambulatory Visit: Admitting: Internal Medicine

## 2024-09-05 ENCOUNTER — Ambulatory Visit: Admitting: Internal Medicine

## 2024-09-05 ENCOUNTER — Telehealth: Payer: Self-pay | Admitting: Internal Medicine

## 2024-09-05 NOTE — Telephone Encounter (Signed)
 Patient has missed 3 appointments which qualifies for dismissal. Please advise, thanks!

## 2024-09-11 NOTE — Telephone Encounter (Signed)
 Contact patient and see if there is a better time to make appts so we can work her in    and  call ahead if can't make it

## 2024-09-16 ENCOUNTER — Other Ambulatory Visit: Payer: Self-pay | Admitting: Internal Medicine

## 2024-11-02 ENCOUNTER — Other Ambulatory Visit: Payer: Self-pay | Admitting: Internal Medicine

## 2024-11-06 NOTE — Telephone Encounter (Signed)
 Attempted to reach pt twice. Call cannot be complete at this time. Will try again.

## 2024-11-21 ENCOUNTER — Ambulatory Visit: Admitting: Internal Medicine

## 2024-11-21 ENCOUNTER — Encounter: Payer: Self-pay | Admitting: Internal Medicine

## 2024-11-21 VITALS — BP 106/68 | HR 65 | Temp 98.2°F | Ht 68.0 in | Wt 206.8 lb

## 2024-11-21 DIAGNOSIS — Z9884 Bariatric surgery status: Secondary | ICD-10-CM

## 2024-11-21 DIAGNOSIS — D519 Vitamin B12 deficiency anemia, unspecified: Secondary | ICD-10-CM

## 2024-11-21 DIAGNOSIS — E559 Vitamin D deficiency, unspecified: Secondary | ICD-10-CM

## 2024-11-21 DIAGNOSIS — Z79899 Other long term (current) drug therapy: Secondary | ICD-10-CM

## 2024-11-21 DIAGNOSIS — Z23 Encounter for immunization: Secondary | ICD-10-CM | POA: Diagnosis not present

## 2024-11-21 DIAGNOSIS — Z136 Encounter for screening for cardiovascular disorders: Secondary | ICD-10-CM | POA: Diagnosis not present

## 2024-11-21 DIAGNOSIS — Z0000 Encounter for general adult medical examination without abnormal findings: Secondary | ICD-10-CM

## 2024-11-21 DIAGNOSIS — I10 Essential (primary) hypertension: Secondary | ICD-10-CM

## 2024-11-21 DIAGNOSIS — Z131 Encounter for screening for diabetes mellitus: Secondary | ICD-10-CM | POA: Diagnosis not present

## 2024-11-21 DIAGNOSIS — R7989 Other specified abnormal findings of blood chemistry: Secondary | ICD-10-CM

## 2024-11-21 LAB — CBC WITH DIFFERENTIAL/PLATELET
Basophils Absolute: 0 K/uL (ref 0.0–0.1)
Basophils Relative: 0.6 % (ref 0.0–3.0)
Eosinophils Absolute: 0.1 K/uL (ref 0.0–0.7)
Eosinophils Relative: 2.2 % (ref 0.0–5.0)
HCT: 36.1 % (ref 36.0–46.0)
Hemoglobin: 12.1 g/dL (ref 12.0–15.0)
Lymphocytes Relative: 53.4 % — ABNORMAL HIGH (ref 12.0–46.0)
Lymphs Abs: 2.1 K/uL (ref 0.7–4.0)
MCHC: 33.6 g/dL (ref 30.0–36.0)
MCV: 89.3 fl (ref 78.0–100.0)
Monocytes Absolute: 0.2 K/uL (ref 0.1–1.0)
Monocytes Relative: 5.8 % (ref 3.0–12.0)
Neutro Abs: 1.5 K/uL (ref 1.4–7.7)
Neutrophils Relative %: 38 % — ABNORMAL LOW (ref 43.0–77.0)
Platelets: 225 K/uL (ref 150.0–400.0)
RBC: 4.04 Mil/uL (ref 3.87–5.11)
RDW: 13.9 % (ref 11.5–15.5)
WBC: 3.9 K/uL — ABNORMAL LOW (ref 4.0–10.5)

## 2024-11-21 LAB — TSH: TSH: 1.11 u[IU]/mL (ref 0.35–5.50)

## 2024-11-21 LAB — HEPATIC FUNCTION PANEL
ALT: 16 U/L (ref 0–35)
AST: 20 U/L (ref 0–37)
Albumin: 4 g/dL (ref 3.5–5.2)
Alkaline Phosphatase: 135 U/L — ABNORMAL HIGH (ref 39–117)
Bilirubin, Direct: 0.2 mg/dL (ref 0.0–0.3)
Total Bilirubin: 1 mg/dL (ref 0.2–1.2)
Total Protein: 6.7 g/dL (ref 6.0–8.3)

## 2024-11-21 LAB — BASIC METABOLIC PANEL WITH GFR
BUN: 14 mg/dL (ref 6–23)
CO2: 28 meq/L (ref 19–32)
Calcium: 9.6 mg/dL (ref 8.4–10.5)
Chloride: 107 meq/L (ref 96–112)
Creatinine, Ser: 0.91 mg/dL (ref 0.40–1.20)
GFR: 69.44 mL/min (ref >60.00)
Glucose, Bld: 85 mg/dL (ref 70–99)
Potassium: 4 meq/L (ref 3.5–5.1)
Sodium: 142 meq/L (ref 135–145)

## 2024-11-21 LAB — LIPID PANEL
Cholesterol: 135 mg/dL (ref 0–200)
HDL: 47 mg/dL (ref >39.00)
LDL Cholesterol: 80 mg/dL (ref 0–99)
NonHDL: 88.22
Total CHOL/HDL Ratio: 3
Triglycerides: 39 mg/dL (ref 0.0–149.0)
VLDL: 7.8 mg/dL (ref 0.0–40.0)

## 2024-11-21 LAB — MICROALBUMIN / CREATININE URINE RATIO
Creatinine,U: 339.4 mg/dL
Microalb Creat Ratio: 2.9 mg/g (ref 0.0–30.0)
Microalb, Ur: 1 mg/dL (ref 0.0–1.9)

## 2024-11-21 LAB — VITAMIN D 25 HYDROXY (VIT D DEFICIENCY, FRACTURES): VITD: 60.85 ng/mL (ref 30.00–100.00)

## 2024-11-21 LAB — HEMOGLOBIN A1C: Hgb A1c MFr Bld: 5.1 % (ref 4.6–6.5)

## 2024-11-21 LAB — VITAMIN B12: Vitamin B-12: 419 pg/mL (ref 211–911)

## 2024-11-21 MED ORDER — AMLODIPINE BESYLATE-VALSARTAN 5-160 MG PO TABS: 1.0000 | ORAL_TABLET | Freq: Every day | ORAL | 3 refills | Status: AC

## 2024-11-21 MED ORDER — SPIRONOLACTONE 25 MG PO TABS: 25.0000 mg | ORAL_TABLET | Freq: Every day | ORAL | 1 refills | Status: AC

## 2024-11-21 MED ORDER — WEGOVY 1.7 MG/0.75ML ~~LOC~~ SOAJ: 1.7000 mg | SUBCUTANEOUS | 3 refills | Status: AC

## 2024-11-21 NOTE — Patient Instructions (Addendum)
 Glad  you are doing well. If getting bp readings below 100 and or feel faint like  We can adjust /lower  BP medication dosing.  Updating lab today and renal function included   Goal weight  bmi below 30  maybe 27  185  or 28 188 would be ok   Optimize  muscle activity while losing weight

## 2024-11-21 NOTE — Progress Notes (Signed)
 Chief Complaint  Patient presents with   Annual Exam    HPI: Patient  Brooke Le  58 y.o. comes in today for Preventive Health Care visit   And med check  HT: now on spironolactone  and ex forge  and doing well no syncope sx  Weight management : on wegovy  and has lost   much weight ? 80 + and feels well with this only issues is excess skin.  Eating less trying to stay active . Taking b12 and vit d vits  Shingles vaccine today decline flu vaccine Down to 10 mg elavil   for ha  doing better  Health Maintenance  Topic Date Due   COVID-19 Vaccine (3 - Moderna risk series) 12/07/2024 (Originally 08/04/2020)   Cervical Cancer Screening (HPV/Pap Cotest)  02/19/2025 (Originally 09/08/2019)   Influenza Vaccine  03/12/2025 (Originally 07/13/2024)   Mammogram  05/22/2025 (Originally 10/10/2024)   Pneumococcal Vaccine: 50+ Years (1 of 1 - PCV) 11/21/2025 (Originally 03/06/2016)   Hepatitis B Vaccines 19-59 Average Risk (1 of 3 - 19+ 3-dose series) 11/21/2025 (Originally 03/06/1985)   Zoster Vaccines- Shingrix (2 of 2) 01/16/2025   DTaP/Tdap/Td (3 - Td or Tdap) 10/18/2028   Colonoscopy  02/13/2030   Hepatitis C Screening  Completed   HIV Screening  Completed   HPV VACCINES  Aged Out   Meningococcal B Vaccine  Aged Out   Health Maintenance Review LIFESTYLE:  Exercise:  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 GI /GU: No adominal pain, vomiting, change in bowel habits. No blood in the stool. No significant GU symptoms. SKIN/HEME: ,no acute skin rashes suspicious lesions or bleeding. No lymphadenopathy, nodules, masses.  NEURO/ PSYCH:  No  neurologic signs such as weakness numbness. No depression anxiety. IMM/ Allergy: No unusual infections.  Allergy .   REST of 12 system review negative except as per HPI   Past Medical History:  Diagnosis Date   Allergic rhinitis    Allergy    Anemia, iron deficiency    Anxiety    Fibromyalgia    Gynecological examination    Dr. 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   Qualifier: Diagnosis of  By: Charlett MD, Apolinar POUR    Syncope    ER evaluation in 2008; had anemia then    Past Surgical History:  Procedure Laterality Date   ADENOIDECTOMY     ESOPHAGOGASTRODUODENOSCOPY  10/31/2002   GASTRIC BYPASS     TUBAL LIGATION     UPPER GASTROINTESTINAL ENDOSCOPY      Family History  Problem Relation Age of Onset   Prostate cancer Father    Esophageal cancer Maternal Aunt    Colon cancer Maternal Uncle    Colon cancer Cousin    Alcohol abuse Other  Diabetes Other    Hypertension Other    Colon cancer Other    Rectal cancer Neg Hx    Stomach cancer Neg Hx    Breast cancer Neg Hx     Social History   Socioeconomic History   Marital status: Legally Separated    Spouse name: Not on file   Number of children: Not on file   Years of education: Not on file   Highest education level: Some college, no degree  Occupational History   Not on file  Tobacco Use   Smoking status: Never   Smokeless tobacco: Never  Vaping Use   Vaping status: Never Used  Substance and Sexual Activity   Alcohol use: Yes    Comment: socially    Drug use: Never   Sexual activity: Not on file  Other Topics Concern   Not on file  Social History Narrative   Married   Regular Exercise- no   Laid off [employer]- July 2011 lost insurance in Jan.2012    Back to work at [employer] long shifts    Now am public relations account executive   hh of 6 teens    No pets             Social Drivers of Corporate Investment Banker Strain: Low Risk  (11/09/2023)   Overall Financial Resource Strain (CARDIA)    Difficulty of Paying Living Expenses: Not very hard  Food Insecurity: No Food Insecurity (11/09/2023)   Hunger Vital Sign    Worried About Running Out of Food in the Last Year: Never true    Ran Out of Food in the Last Year: Never true  Transportation Needs: No Transportation Needs (11/09/2023)   PRAPARE - Administrator, Civil Service (Medical): No    Lack of Transportation (Non-Medical): No  Physical Activity: Insufficiently Active (11/09/2023)   Exercise Vital Sign    Days of Exercise per Week: 1 day    Minutes of Exercise per Session: 10 min  Stress: No Stress Concern Present (11/09/2023)   [employer] of Occupational Health - Occupational Stress Questionnaire    Feeling of Stress : Only a little  Social Connections: Socially Integrated (11/09/2023)   Social Connection and Isolation Panel    Frequency of Communication with Friends and Family: More than three times a week    Frequency of Social Gatherings with Friends and Family: Once a week    Attends Religious Services: More than 4 times per year    Active Member of [employer] or Organizations: Yes    Attends Banker Meetings: 1 to 4 times per year    Marital Status: Living with partner    Outpatient Medications Prior to Visit  Medication Sig Dispense Refill   amitriptyline  (ELAVIL ) 10 MG tablet TAKE 2 TABLETS BY MOUTH AT BEDTIME (Patient taking differently: Take 10 mg by mouth at bedtime.) 180 tablet 0   Cyanocobalamin  (B-12) 1000 MCG SUBL Place 1 tablet under the tongue daily. 30 tablet 3   Vitamin D , Ergocalciferol , (DRISDOL ) 1.25 MG (50000 UNIT) CAPS capsule Take 1 capsule (50,000 Units total) by mouth every 7 (seven) days. 12 capsule 1    amLODipine -valsartan  (EXFORGE ) 5-160 MG tablet TAKE 1 TABLET BY MOUTH EVERY DAY 90 tablet 0   semaglutide -weight management (WEGOVY ) 1.7 MG/0.75ML SOAJ SQ injection INJECT 1.7 MG INTO THE SKIN ONCE A WEEK. DOSAGE INCREASE 2 mL 0   spironolactone  (ALDACTONE ) 25 MG tablet TAKE 1 TABLET (25 MG TOTAL)  BY MOUTH DAILY. (INSTEAD OF HYDROCHLOROTHIAZIDE  AND POTASSIUM) 90 tablet 1   No facility-administered medications prior to visit.     EXAM:  BP 106/68 (BP Location: Right Arm, Patient Position: Sitting, Cuff Size: Large)   Pulse 65   Temp 98.2 F (36.8 C) (Oral)   Ht 5' 8 (1.727 m)   Wt 206 lb 12.8 oz (93.8 kg)   LMP 10/04/2018 (Approximate)   SpO2 100%   BMI 31.44 kg/m   Body mass index is 31.44 kg/m. Wt Readings from Last 3 Encounters:  11/21/24 206 lb 12.8 oz (93.8 kg)  05/08/24 235 lb (106.6 kg)  05/02/24 238 lb 9.6 oz (108.2 kg)    Physical Exam: Vital signs reviewed HZW:Uypd is a well-developed well-nourished alert cooperative    who appearsr stated age in no acute distress.  HEENT: normocephalic atraumatic , Eyes: PERRL EOM's full, conjunctiva clear, Nares: paten,t no deformity discharge or tenderness., Ears: no deformity EAC's clear TMs with normal landmarks. Mouth: clear OP, no lesions, edema.  Moist mucous membranes. Dentition in adequate repair. NECK: supple without masses, thyromegaly or bruits. CHEST/PULM:  Clear to auscultation and percussion breath sounds equal no wheeze , rales or rhonchi. No chest wall deformities or tenderness. Breast: normal by inspection .defer to gyne CV: PMI is nondisplaced, S1 S2 no gallops, murmurs, rubs. Peripheral pulses are full without delay.No JVD .  ABDOMEN: Bowel sounds normal nontender  No guard or rebound, no hepato splenomegal no CVA tenderness.  Extremtities:  No clubbing cyanosis or edema, no acute joint swelling or redness no focal atrophy NEURO:  Oriented x3, cranial nerves 3-12 appear to be intact, no obvious focal weakness,gait  within normal limits no abnormal reflexes or asymmetrical SKIN: No acute rashes normal turgor, color, no bruising or petechiae. PSYCH: Oriented, good eye contact, no obvious depression anxiety, cognition and judgment appear normal. LN: no cervical axillary inguinal adenopathy  Lab Results  Component Value Date   WBC 5.6 03/13/2024   HGB 12.1 03/13/2024   HCT 36.1 03/13/2024   PLT 402.0 (H) 03/13/2024   GLUCOSE 82 05/02/2024   CHOL 163 01/11/2024   TRIG 65.0 01/11/2024   HDL 39.90 01/11/2024   LDLCALC 110 (H) 01/11/2024   ALT 17 01/11/2024   AST 22 01/11/2024   NA 142 05/02/2024   K 3.5 05/02/2024   CL 107 05/02/2024   CREATININE 0.91 05/02/2024   BUN 12 05/02/2024   CO2 30 05/02/2024   TSH 0.88 01/11/2024   HGBA1C 5.6 01/11/2024    BP Readings from Last 3 Encounters:  11/21/24 106/68  05/08/24 (!) 130/92  05/02/24 (!) 126/90    Lab plan reviewed with patient   ASSESSMENT AND PLAN:  Discussed the following assessment and plan:    ICD-10-CM   1. Visit for preventive health examination  Z00.00 Zoster Recombinant (Shingrix )    amLODipine -valsartan  (EXFORGE ) 5-160 MG tablet    semaglutide -weight management (WEGOVY ) 1.7 MG/0.75ML SOAJ SQ injection    spironolactone  (ALDACTONE ) 25 MG tablet    Vitamin B12    Basic metabolic panel with GFR    CBC with Differential/Platelet    Hemoglobin A1c    Hepatic function panel    Lipid panel    TSH    Microalbumin / creatinine urine ratio    Vitamin D , 25-hydroxy    Vitamin B1    2. Morbid obesity (HCC)  E66.01 Zoster Recombinant (Shingrix )    amLODipine -valsartan  (EXFORGE ) 5-160 MG tablet    semaglutide -weight management (WEGOVY ) 1.7  MG/0.75ML SOAJ SQ injection    spironolactone  (ALDACTONE ) 25 MG tablet    Vitamin B12    Basic metabolic panel with GFR    CBC with Differential/Platelet    Hemoglobin A1c    Hepatic function panel    Lipid panel    TSH    Microalbumin / creatinine urine ratio    Vitamin D , 25-hydroxy     Vitamin B1    3. Medication management  Z79.899 Zoster Recombinant (Shingrix )    amLODipine -valsartan  (EXFORGE ) 5-160 MG tablet    semaglutide -weight management (WEGOVY ) 1.7 MG/0.75ML SOAJ SQ injection    spironolactone  (ALDACTONE ) 25 MG tablet    Vitamin B12    Basic metabolic panel with GFR    CBC with Differential/Platelet    Hemoglobin A1c    Hepatic function panel    Lipid panel    TSH    Microalbumin / creatinine urine ratio    Vitamin D , 25-hydroxy    Vitamin B1    4. Essential hypertension  I10 Zoster Recombinant (Shingrix )    amLODipine -valsartan  (EXFORGE ) 5-160 MG tablet    semaglutide -weight management (WEGOVY ) 1.7 MG/0.75ML SOAJ SQ injection    spironolactone  (ALDACTONE ) 25 MG tablet    Vitamin B12    Basic metabolic panel with GFR    CBC with Differential/Platelet    Hemoglobin A1c    Hepatic function panel    Lipid panel    TSH    Microalbumin / creatinine urine ratio    Vitamin D , 25-hydroxy    Vitamin B1    5. Status post bariatric surgery  Z98.84 Zoster Recombinant (Shingrix )    amLODipine -valsartan  (EXFORGE ) 5-160 MG tablet    semaglutide -weight management (WEGOVY ) 1.7 MG/0.75ML SOAJ SQ injection    spironolactone  (ALDACTONE ) 25 MG tablet    Vitamin B12    Basic metabolic panel with GFR    CBC with Differential/Platelet    Hemoglobin A1c    Hepatic function panel    Lipid panel    TSH    Microalbumin / creatinine urine ratio    Vitamin D , 25-hydroxy    Vitamin B1    6. Need for shingles vaccine  Z23 Zoster Recombinant (Shingrix )    7. Well-controlled hypertension  I10 amLODipine -valsartan  (EXFORGE ) 5-160 MG tablet    spironolactone  (ALDACTONE ) 25 MG tablet    Vitamin B12    Basic metabolic panel with GFR    CBC with Differential/Platelet    Hemoglobin A1c    Hepatic function panel    Lipid panel    TSH    Microalbumin / creatinine urine ratio    Vitamin D , 25-hydroxy    Vitamin B1    8. Vitamin D  deficiency  E55.9 Vitamin B12     Basic metabolic panel with GFR    CBC with Differential/Platelet    Hemoglobin A1c    Hepatic function panel    Lipid panel    TSH    Microalbumin / creatinine urine ratio    Vitamin D , 25-hydroxy    Vitamin B1    9. Low vitamin B12 level  R79.89 Vitamin B12    Basic metabolic panel with GFR    CBC with Differential/Platelet    Hemoglobin A1c    Hepatic function panel    Lipid panel    TSH    Microalbumin / creatinine urine ratio    Vitamin D , 25-hydroxy    Vitamin B1    Weight loss  on wegovy    feels well with  this  Goal weight she would like around 190  disc at least below bmi 30   ok and optimize muscle exercise .  If low bp symptoms and readings we can adjust bp meds Vit level check today  with hx of bariatric surgery  All parameters seem to be doing better  clinically   Return in about 4 months (around 03/22/2025) for 4-6 months depending on how doing .  Patient Care Team: Payeton Germani K, MD as PCP - General Dr. Massie Smiles as Obstetrician Patient Instructions  Glad  you are doing well. If getting bp readings below 100 and or feel faint like  We can adjust /lower  BP medication dosing.  Updating lab today and renal function included   Goal weight  bmi below 30  maybe 27  185  or 28 188 would be ok   Optimize  muscle activity while losing weight   Apolinar K. Juana Haralson M.D.

## 2024-11-23 LAB — EXTRA SPECIMEN

## 2024-11-23 LAB — VITAMIN B1

## 2024-11-29 ENCOUNTER — Other Ambulatory Visit: Payer: Self-pay

## 2024-12-04 ENCOUNTER — Other Ambulatory Visit: Payer: Self-pay | Admitting: Internal Medicine

## 2024-12-04 ENCOUNTER — Other Ambulatory Visit

## 2024-12-04 DIAGNOSIS — Z9884 Bariatric surgery status: Secondary | ICD-10-CM

## 2024-12-04 DIAGNOSIS — E569 Vitamin deficiency, unspecified: Secondary | ICD-10-CM

## 2024-12-07 LAB — VITAMIN B1: Vitamin B1 (Thiamine): 9 nmol/L (ref 8–30)

## 2024-12-09 ENCOUNTER — Ambulatory Visit: Payer: Self-pay | Admitting: Internal Medicine

## 2024-12-09 NOTE — Progress Notes (Signed)
 B1  in normal range   low normal )(

## 2024-12-12 NOTE — Progress Notes (Signed)
 Attempted to reach pt.  Call cannot be completed at this time will try again.

## 2024-12-19 NOTE — Telephone Encounter (Signed)
 Last read by Jamee KIDD Pinder-Sargeant at 12:52PM on 12/19/2024.

## 2025-01-02 NOTE — Telephone Encounter (Signed)
 Ok to compose letter to say   that her hypertension is controlled .

## 2025-01-05 ENCOUNTER — Other Ambulatory Visit: Payer: Self-pay | Admitting: Internal Medicine

## 2025-01-05 DIAGNOSIS — Z79899 Other long term (current) drug therapy: Secondary | ICD-10-CM

## 2025-01-05 DIAGNOSIS — Z0000 Encounter for general adult medical examination without abnormal findings: Secondary | ICD-10-CM

## 2025-01-05 DIAGNOSIS — Z9884 Bariatric surgery status: Secondary | ICD-10-CM

## 2025-01-05 DIAGNOSIS — I10 Essential (primary) hypertension: Secondary | ICD-10-CM

## 2025-01-08 NOTE — Telephone Encounter (Signed)
 Not sure what tot do with this and does patient know ? What  other meds will they cover  since she has been on this medication

## 2025-01-10 NOTE — Telephone Encounter (Signed)
 Attempted to reach pt. Call cannot be completed at this time. Will try again.

## 2025-01-16 NOTE — Telephone Encounter (Signed)
 Attempted to reach pt. Left a voicemail to call us back.

## 2025-01-18 NOTE — Telephone Encounter (Signed)
 Contacted pt. Inform pt of provider's message. Pt reports she got a notification that her Rx is ready for pick up with copay $24.99. pt states she will let us  know if things change. Inform pt, if still showing insurance doesn't cover. Pt can contact her insurance to get list of alternatives that insurance will cover.
